# Patient Record
Sex: Male | Born: 1950 | Race: Black or African American | Hispanic: No | Marital: Married | Smoking: Former smoker
Health system: Southern US, Community
[De-identification: ages and names within clinical notes are randomized; demographics above are authoritative.]

## PROBLEM LIST (undated history)

## (undated) DIAGNOSIS — Z95810 Presence of automatic (implantable) cardiac defibrillator: Secondary | ICD-10-CM

## (undated) DIAGNOSIS — I428 Other cardiomyopathies: Secondary | ICD-10-CM

## (undated) DIAGNOSIS — I513 Intracardiac thrombosis, not elsewhere classified: Secondary | ICD-10-CM

## (undated) DIAGNOSIS — G4730 Sleep apnea, unspecified: Secondary | ICD-10-CM

## (undated) DIAGNOSIS — R0600 Dyspnea, unspecified: Secondary | ICD-10-CM

## (undated) DIAGNOSIS — I509 Heart failure, unspecified: Secondary | ICD-10-CM

## (undated) DIAGNOSIS — F418 Other specified anxiety disorders: Secondary | ICD-10-CM

## (undated) DIAGNOSIS — I499 Cardiac arrhythmia, unspecified: Secondary | ICD-10-CM

## (undated) DIAGNOSIS — I420 Dilated cardiomyopathy: Secondary | ICD-10-CM

## (undated) DIAGNOSIS — I4891 Unspecified atrial fibrillation: Secondary | ICD-10-CM

## (undated) DIAGNOSIS — E785 Hyperlipidemia, unspecified: Secondary | ICD-10-CM

## (undated) HISTORY — PX: ICD IMPLANT: EP1208

---
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
## 2017-03-03 DIAGNOSIS — I5023 Acute on chronic systolic (congestive) heart failure: Secondary | ICD-10-CM

## 2017-03-03 DIAGNOSIS — I428 Other cardiomyopathies: Secondary | ICD-10-CM

## 2017-03-03 HISTORY — DX: Other cardiomyopathies: I42.8

## 2019-04-08 DIAGNOSIS — I4891 Unspecified atrial fibrillation: Secondary | ICD-10-CM | POA: Insufficient documentation

## 2019-04-08 DIAGNOSIS — F418 Other specified anxiety disorders: Secondary | ICD-10-CM | POA: Insufficient documentation

## 2019-04-08 DIAGNOSIS — Z95810 Presence of automatic (implantable) cardiac defibrillator: Secondary | ICD-10-CM

## 2019-04-08 DIAGNOSIS — I509 Heart failure, unspecified: Secondary | ICD-10-CM

## 2019-04-08 DIAGNOSIS — E785 Hyperlipidemia, unspecified: Secondary | ICD-10-CM | POA: Diagnosis present

## 2019-04-08 HISTORY — DX: Presence of automatic (implantable) cardiac defibrillator: Z95.810

## 2019-04-08 HISTORY — DX: Hyperlipidemia, unspecified: E78.5

## 2019-04-08 HISTORY — DX: Unspecified atrial fibrillation: I48.91

## 2019-04-08 HISTORY — DX: Other specified anxiety disorders: F41.8

## 2019-04-08 HISTORY — DX: Heart failure, unspecified: I50.9

## 2019-04-09 DIAGNOSIS — I420 Dilated cardiomyopathy: Secondary | ICD-10-CM | POA: Diagnosis present

## 2019-04-09 HISTORY — DX: Dilated cardiomyopathy: I42.0

## 2019-04-13 DIAGNOSIS — I513 Intracardiac thrombosis, not elsewhere classified: Secondary | ICD-10-CM

## 2019-04-13 HISTORY — DX: Intracardiac thrombosis, not elsewhere classified: I51.3

## 2019-04-20 ENCOUNTER — Other Ambulatory Visit: Payer: Self-pay

## 2019-04-20 ENCOUNTER — Emergency Department (HOSPITAL_COMMUNITY): Payer: No Typology Code available for payment source

## 2019-04-20 ENCOUNTER — Inpatient Hospital Stay (HOSPITAL_COMMUNITY)
Admission: EM | Admit: 2019-04-20 | Discharge: 2019-04-24 | DRG: 287 | Disposition: A | Discharge status: Home / Self Care | Payer: No Typology Code available for payment source | Attending: Cardiovascular Disease | Admitting: Cardiovascular Disease

## 2019-04-20 DIAGNOSIS — J90 Pleural effusion, not elsewhere classified: Secondary | ICD-10-CM | POA: Diagnosis not present

## 2019-04-20 DIAGNOSIS — N183 Chronic kidney disease, stage 3 (moderate): Secondary | ICD-10-CM | POA: Diagnosis present

## 2019-04-20 DIAGNOSIS — I4819 Other persistent atrial fibrillation: Secondary | ICD-10-CM | POA: Diagnosis present

## 2019-04-20 DIAGNOSIS — Z9221 Personal history of antineoplastic chemotherapy: Secondary | ICD-10-CM

## 2019-04-20 DIAGNOSIS — R0602 Shortness of breath: Secondary | ICD-10-CM | POA: Diagnosis not present

## 2019-04-20 DIAGNOSIS — Z8572 Personal history of non-Hodgkin lymphomas: Secondary | ICD-10-CM

## 2019-04-20 DIAGNOSIS — I428 Other cardiomyopathies: Secondary | ICD-10-CM

## 2019-04-20 DIAGNOSIS — Z79899 Other long term (current) drug therapy: Secondary | ICD-10-CM

## 2019-04-20 DIAGNOSIS — I509 Heart failure, unspecified: Secondary | ICD-10-CM

## 2019-04-20 DIAGNOSIS — I420 Dilated cardiomyopathy: Secondary | ICD-10-CM | POA: Diagnosis present

## 2019-04-20 DIAGNOSIS — I5023 Acute on chronic systolic (congestive) heart failure: Secondary | ICD-10-CM | POA: Diagnosis not present

## 2019-04-20 DIAGNOSIS — I4891 Unspecified atrial fibrillation: Secondary | ICD-10-CM | POA: Diagnosis present

## 2019-04-20 DIAGNOSIS — Z1159 Encounter for screening for other viral diseases: Secondary | ICD-10-CM

## 2019-04-20 DIAGNOSIS — Z7901 Long term (current) use of anticoagulants: Secondary | ICD-10-CM

## 2019-04-20 DIAGNOSIS — I513 Intracardiac thrombosis, not elsewhere classified: Secondary | ICD-10-CM | POA: Diagnosis present

## 2019-04-20 DIAGNOSIS — I272 Pulmonary hypertension, unspecified: Secondary | ICD-10-CM | POA: Diagnosis present

## 2019-04-20 DIAGNOSIS — F418 Other specified anxiety disorders: Secondary | ICD-10-CM | POA: Diagnosis present

## 2019-04-20 DIAGNOSIS — Z95810 Presence of automatic (implantable) cardiac defibrillator: Secondary | ICD-10-CM | POA: Diagnosis present

## 2019-04-20 DIAGNOSIS — K219 Gastro-esophageal reflux disease without esophagitis: Secondary | ICD-10-CM | POA: Diagnosis present

## 2019-04-20 DIAGNOSIS — E785 Hyperlipidemia, unspecified: Secondary | ICD-10-CM | POA: Diagnosis present

## 2019-04-20 HISTORY — DX: Dyspnea, unspecified: R06.00

## 2019-04-20 HISTORY — DX: Presence of automatic (implantable) cardiac defibrillator: Z95.810

## 2019-04-20 HISTORY — DX: Other cardiomyopathies: I42.8

## 2019-04-20 HISTORY — DX: Cardiac arrhythmia, unspecified: I49.9

## 2019-04-20 HISTORY — DX: Intracardiac thrombosis, not elsewhere classified: I51.3

## 2019-04-20 LAB — COMPREHENSIVE METABOLIC PANEL
ALT: 67 U/L — ABNORMAL HIGH (ref 0–44)
AST: 30 U/L (ref 15–41)
Albumin: 3.2 g/dL — ABNORMAL LOW (ref 3.5–5.0)
Alkaline Phosphatase: 125 U/L (ref 38–126)
Anion gap: 11 (ref 5–15)
BUN: 20 mg/dL (ref 8–23)
CO2: 25 mmol/L (ref 22–32)
Calcium: 9.1 mg/dL (ref 8.9–10.3)
Chloride: 101 mmol/L (ref 98–111)
Creatinine, Ser: 1.61 mg/dL — ABNORMAL HIGH (ref 0.61–1.24)
GFR calc Af Amer: 50 mL/min — ABNORMAL LOW (ref >60)
GFR calc non Af Amer: 43 mL/min — ABNORMAL LOW (ref >60)
Glucose, Bld: 137 mg/dL — ABNORMAL HIGH (ref 70–99)
Potassium: 4.5 mmol/L (ref 3.5–5.1)
Sodium: 137 mmol/L (ref 135–145)
Total Bilirubin: 1.4 mg/dL — ABNORMAL HIGH (ref 0.3–1.2)
Total Protein: 6.4 g/dL — ABNORMAL LOW (ref 6.5–8.1)

## 2019-04-20 LAB — CBC
HCT: 45.2 % (ref 39.0–52.0)
Hemoglobin: 14.3 g/dL (ref 13.0–17.0)
MCH: 27.9 pg (ref 26.0–34.0)
MCHC: 31.6 g/dL (ref 30.0–36.0)
MCV: 88.3 fL (ref 80.0–100.0)
Platelets: 207 10*3/uL (ref 150–400)
RBC: 5.12 MIL/uL (ref 4.22–5.81)
RDW: 15 % (ref 11.5–15.5)
WBC: 9.7 10*3/uL (ref 4.0–10.5)
nRBC: 0 % (ref 0.0–0.2)

## 2019-04-20 LAB — SARS CORONAVIRUS 2 BY RT PCR (HOSPITAL ORDER, PERFORMED IN ~~LOC~~ HOSPITAL LAB): SARS Coronavirus 2: NEGATIVE

## 2019-04-20 LAB — BRAIN NATRIURETIC PEPTIDE: B Natriuretic Peptide: 2049.6 pg/mL — ABNORMAL HIGH (ref 0.0–100.0)

## 2019-04-20 LAB — TROPONIN I (HIGH SENSITIVITY): Troponin I (High Sensitivity): 10 ng/L (ref <18)

## 2019-04-20 MED ORDER — FUROSEMIDE 10 MG/ML IJ SOLN
40.0000 mg | Freq: Once | INTRAMUSCULAR | Status: AC
  Administered 2019-04-20: 40 mg via INTRAVENOUS
  Filled 2019-04-20: qty 4

## 2019-04-20 MED ORDER — DIGOXIN 125 MCG PO TABS
0.1250 mg | ORAL_TABLET | Freq: Every day | ORAL | Status: DC
  Administered 2019-04-21 – 2019-04-24 (×4): 0.125 mg via ORAL
  Filled 2019-04-20 (×4): qty 1

## 2019-04-20 MED ORDER — FUROSEMIDE 20 MG PO TABS
20.0000 mg | ORAL_TABLET | Freq: Two times a day (BID) | ORAL | Status: DC
  Administered 2019-04-21 (×2): 20 mg via ORAL
  Filled 2019-04-20 (×2): qty 1

## 2019-04-20 MED ORDER — ONDANSETRON HCL 4 MG/2ML IJ SOLN: 4.0000 mg | Freq: Four times a day (QID) | INTRAMUSCULAR | Status: DC | PRN

## 2019-04-20 MED ORDER — ACETAMINOPHEN 325 MG PO TABS: 650.0000 mg | ORAL_TABLET | ORAL | Status: DC | PRN

## 2019-04-20 MED ORDER — APIXABAN 5 MG PO TABS
5.0000 mg | ORAL_TABLET | Freq: Two times a day (BID) | ORAL | Status: DC
  Administered 2019-04-21 (×3): 5 mg via ORAL
  Filled 2019-04-20 (×4): qty 1

## 2019-04-20 MED ORDER — METOPROLOL SUCCINATE ER 50 MG PO TB24
50.0000 mg | ORAL_TABLET | Freq: Two times a day (BID) | ORAL | Status: DC
  Administered 2019-04-21 – 2019-04-23 (×6): 50 mg via ORAL
  Filled 2019-04-20 (×7): qty 1

## 2019-04-20 NOTE — H&P (Signed)
CARDIOLOGY H&P  HPI: Jon Oconnell is a 68 y.o. male w/ history of nonischemic CM, HFrEF (LVEF 25%) s/p ICD, and a recent diagnosis of atrial fibrillation c/b left atrial thrombus who presents with fatigue.   Briefly, the patient has longstanding history of chronic reduced ejection fraction heart failure.  He recently moved to the [place] area from [place] and receives much of his care at the [place].  The patient presents to the ED tonight describing approximately 2 weeks of worsening fatigue and weakness.  Notably, he was admitted to [facility] approximately 2 weeks ago with new onset palpitations, fatigue, and shortness of breath.  He was found to be in new atrial fibrillation at that time with RVR.  His BNP was elevated and his creatinine was mildly elevated at 1.5, however with an unknown baseline.  He underwent TEE with plans for electrical cardioversion, however this was aborted after he was found to have a left atrial thrombus.  He was started on amiodarone, beta-blockade, and digoxin as well as apixaban and discharge.  Since that time, the patient describes symptoms of progressive fatigue, exertional shortness of breath, PND, and orthopnea.  He has had no weight gain or leg swelling.  He notes that many of his medications from his recent hospitalization at [facility] were changed including several medications that were discontinued.  He has his new medications with him for review.  He has been taking all of his medications as prescribed and notes that he has not missed any doses of his apixaban since this was for started.  Per record review in care everywhere, his TEE revealing left atrial thrombus was performed on 7/20 and he was discharged from the hospital on 7/21.  In the [facility] emergency department, the patient was found to have an elevated BNP and some crackles in his lung.  He was given 1 dose of IV Lasix with good urine output.  Review of Systems:     Cardiac Review of Systems: {Y]  = yes [ ]  = no  Chest Pain [    ]  Resting SOB [   ] Exertional SOB  [Y]  Orthopnea [Y]   Pedal Edema [   ]    Palpitations [Y] Syncope  [  ]   Presyncope [   ]  General Review of Systems: [Y] = yes [  ]=no Constitional: recent weight change [  ]; anorexia [  ]; fatigue [Y]; nausea [  ]; night sweats [  ]; fever [  ]; or chills [  ];                                                                     Dental: poor dentition[  ];   Eye : blurred vision [  ]; diplopia [   ]; vision changes [  ];  Amaurosis fugax[  ]; Resp: cough [  ];  wheezing[  ];  hemoptysis[  ]; shortness of breath[Y]; paroxysmal nocturnal dyspnea[Y]; dyspnea on exertion[Y]; or orthopnea[Y];  GI:  gallstones[  ], vomiting[  ];  dysphagia[  ]; melena[  ];  hematochezia [  ]; heartburn[  ];   GU: kidney stones [  ]; hematuria[  ];   dysuria [  ];  nocturia[  ];               Skin: rash [  ], swelling[  ];, hair loss[  ];  peripheral edema[  ];  or itching[  ]; Musculosketetal: myalgias[  ];  joint swelling[  ];  joint erythema[  ];  joint pain[  ];  back pain[  ];  Heme/Lymph: bruising[  ];  bleeding[  ];  anemia[  ];  Neuro: TIA[  ];  headaches[  ];  stroke[  ];  vertigo[  ];  seizures[  ];   paresthesias[  ];  difficulty walking[  ];  Psych:depression[  ]; anxiety[  ];  Endocrine: diabetes[  ];  thyroid dysfunction[  ];  Other:  No past medical history on file.  Prior to Admission medications   Medication Sig Start Date End Date Taking? Authorizing Provider  albuterol (VENTOLIN HFA) 108 (90 Base) MCG/ACT inhaler Inhale 2 puffs into the lungs every 6 (six) hours as needed for wheezing or shortness of breath.   Yes [provider]  amiodarone (PACERONE) 200 MG tablet Take 400 mg by mouth daily.   Yes [provider]  apixaban (ELIQUIS) 5 MG TABS tablet Take 5 mg by mouth 2 (two) times daily.   Yes [provider]  digoxin (LANOXIN) 0.125 MG tablet Take 0.125 mg by mouth daily.   Yes [provider]  furosemide (LASIX) 20 MG tablet Take 20 mg by mouth 2 (two) times daily.   Yes [provider]  metoprolol succinate (TOPROL-XL) 50 MG 24 hr tablet Take 50 mg by mouth 2 (two) times a day. Take with or immediately following a meal.   Yes [provider]  UNKNOWN TO PATIENT Inhale 1 ampule into the lungs See admin instructions. Unknown to patient (neb solution): Inhale the contents of 1 ampule into the lungs every 6 hours as needed for shortness of breath or wheezing   Yes [provider]  aspirin EC 81 MG tablet Take 81 mg by mouth daily.    [provider]  eplerenone (INSPRA) 25 MG tablet Take 12.5 mg by mouth 2 (two) times daily.    [provider]  ivabradine (CORLANOR) 5 MG TABS tablet Take 5 mg by mouth 2 (two) times daily with a meal.    [provider]  losartan (COZAAR) 25 MG tablet Take 12.5 mg by mouth daily.    [provider]  omeprazole (PRILOSEC) 20 MG capsule Take 40 mg by mouth daily.    [provider]  sertraline (ZOLOFT) 100 MG tablet Take 50 mg by mouth daily.    [provider]  simvastatin (ZOCOR) 80 MG tablet Take 40 mg by mouth at bedtime.    [provider]      No Known Allergies  Social History   Socioeconomic History   Marital status: Married    Spouse name: Not on file   Number of children: Not on file   Years of education: Not on file   Highest education level: Not on file  Occupational History   Not on file  Social Needs   Financial resource strain: Not on file   Food insecurity    Worry: Not on file    Inability: Not on file   Transportation needs    Medical: Not on file    Non-medical: Not on file  Tobacco Use   Smoking status: Not on file  Substance and Sexual Activity   Alcohol  use: Not on file   Drug use: Not on file   Sexual activity: Not on file  Lifestyle   Physical activity    Days per week: Not on file    Minutes  per session: Not on file   Stress: Not on file  Relationships   Social connections    Talks on phone: Not on file    Gets together: Not on file    Attends religious service: Not on file    Active member of club or organization: Not on file    Attends meetings of clubs or organizations: Not on file    Relationship status: Not on file   Intimate partner violence    Fear of current or ex partner: Not on file    Emotionally abused: Not on file    Physically abused: Not on file    Forced sexual activity: Not on file  Other Topics Concern   Not on file  Social History Narrative   Not on file    No family history on file.  PHYSICAL EXAM: Vitals:   04/20/19 2130 04/20/19 2145  BP: 112/88 107/85  Pulse: 81 74  Resp: 16 20  Temp:    SpO2: 98% 96%   General:  Well appearing. No respiratory difficulty HEENT: normal Neck: supple. JVP normal at around 4 cm H2O. Carotids 2+ bilat; no bruits. No lymphadenopathy or thryomegaly appreciated. Cor: PMI nondisplaced.  Irregularly irregular rhythm.  Normal rate.  No appreciable rubs, gallops or murmurs. Lungs: clear to auscultation bilaterally, normal work of breathing, good air movement throughout Abdomen: soft, nontender, nondistended. No hepatosplenomegaly. No bruits or masses. Good bowel sounds. Extremities: no cyanosis, clubbing, rash, edema; warm bilaterally Neuro: alert & oriented x 3, cranial nerves grossly intact. moves all 4 extremities w/o difficulty. Affect pleasant.  ECG: Rate controlled atrial fibrillation with heart rate 84 bpm, left axis deviation, borderline prolonged QT interval, anteroseptal Q waves, nonspecific ST and T wave changes throughout, no prior ECG for comparison  Results for orders placed or performed during the hospital encounter of 04/20/19 (from the past 24 hour(s))  CBC     Status: None   Collection Time: 04/20/19  4:59 PM  Result Value Ref Range   WBC 9.7 4.0 - 10.5 K/uL   RBC 5.12 4.22 - 5.81 MIL/uL    Hemoglobin 14.3 13.0 - 17.0 g/dL   HCT 45.2 39.0 - 52.0 %   MCV 88.3 80.0 - 100.0 fL   MCH 27.9 26.0 - 34.0 pg   MCHC 31.6 30.0 - 36.0 g/dL   RDW 15.0 11.5 - 15.5 %   Platelets 207 150 - 400 K/uL   nRBC 0.0 0.0 - 0.2 %  Comprehensive metabolic panel     Status: Abnormal   Collection Time: 04/20/19  4:59 PM  Result Value Ref Range   Sodium 137 135 - 145 mmol/L   Potassium 4.5 3.5 - 5.1 mmol/L   Chloride 101 98 - 111 mmol/L   CO2 25 22 - 32 mmol/L   Glucose, Bld 137 (H) 70 - 99 mg/dL   BUN 20 8 - 23 mg/dL   Creatinine, Ser 1.61 (H) 0.61 - 1.24 mg/dL   Calcium 9.1 8.9 - 10.3 mg/dL   Total Protein 6.4 (L) 6.5 - 8.1 g/dL   Albumin 3.2 (L) 3.5 - 5.0 g/dL   AST 30 15 - 41 U/L   ALT 67 (H) 0 - 44 U/L   Alkaline Phosphatase 125 38 - 126 U/L  Total Bilirubin 1.4 (H) 0.3 - 1.2 mg/dL   GFR calc non Af Amer 43 (L) >60 mL/min   GFR calc Af Amer 50 (L) >60 mL/min   Anion gap 11 5 - 15  Troponin I (High Sensitivity)     Status: None   Collection Time: 04/20/19  4:59 PM  Result Value Ref Range   Troponin I (High Sensitivity) 10 <18 ng/L  Brain natriuretic peptide     Status: Abnormal   Collection Time: 04/20/19  4:59 PM  Result Value Ref Range   B Natriuretic Peptide 2,049.6 (H) 0.0 - 100.0 pg/mL  SARS Coronavirus 2 (CEPHEID - Performed in [place] hospital lab), Hosp Order     Status: None   Collection Time: 04/20/19  6:55 PM   Specimen: Nasopharyngeal Swab  Result Value Ref Range   SARS Coronavirus 2 NEGATIVE NEGATIVE   Dg Chest Port 1 View  Result Date: 04/20/2019 CLINICAL DATA:  Shortness of breath, generalized weakness for 2 weeks EXAM: PORTABLE CHEST 1 VIEW COMPARISON:  None. FINDINGS: There is a small left pleural effusion. There is left basilar airspace disease. There is right perihilar airspace disease. At there is no pneumothorax. Hit the heart mediastinum are stable. There is a cardiac pacemaker present. There is no acute osseous abnormality. IMPRESSION: Small left  pleural effusion with left basilar airspace disease which may reflect atelectasis versus pneumonia. Right perihilar airspace disease concerning for pneumonia. Electronically Signed   By: Kathreen Devoid   On: 04/20/2019 17:41   ASSESSMENT: Jon Oconnell is a 67 y.o. male w/ history of nonischemic CM, HFrEF (LVEF 25%) s/p ICD, and a recent diagnosis of atrial fibrillation c/b left atrial thrombus who presents with fatigue and exertional shortness of breath.  Differential diagnosis for the patient's symptoms includes acute decompensated heart failure, pulmonary embolism, acute coronary syndrome, worsening functional status from heart failure and cardiac arrhythmia.  I examined the patient after he was given IV Lasix and on my exam he was found to be euvolemic.  His symptoms do not seem consistent with pulmonary embolism or acute coronary syndrome and he reassuringly has a normal initial troponin and an ECG without acute ischemia.  His symptoms are most likely consistent with worsening cardiac output in the setting of chronic reduced ejection fraction heart failure and new cardiac arrhythmia (atrial fibrillation).   I had a very long discussion with the patient as well as his girlfriend by telephone describing to him the nature of his condition and the difficult position he is in given his recent history of left atrial thrombus.  He is well rate controlled from an atrial fibrillation standpoint at this time and would likely benefit from restoration of normal sinus rhythm.  I explained to him however that this is not possible at this time with his left atrial thrombus in situ.  I explained to him that it is very important for him to finish at least 3 weeks of oral anticoagulation and he may need a repeat TEE to evaluate for resolution of the thrombus before cardioversion could be pursued.  I gave him the option of discharge tonight given that he is euvolemic and well rate controlled.  The patient however  expressed a very strong preference to stay in the hospital for a period of observation as he is very worried that "something bad is going to happen to him."  Notably, the patient's wife is a patient of Dr. Stanford Breed, whom she has contacted and agrees to be the patient's  cardiologist per her report.  PLAN/DISCUSSION: #) Fatigue, SOB: Likely due to cardiac arrhythmia (atrial fibrillation) in the setting of chronic reduced ejection fraction heart failure as noted above.  Patient currently warm and euvolemic. - admit for observation - ambulate to evaluate rate and oxygenation control with exertion - repeat troponin x1 - stop amiodarone given risk for chemical cardioversion (this was started at [facility]) - continue digoxin 0.125mg  daily - continue apixaban 5mg  BID - continue metoprolol succinate 50mg  BID - cont home lasix 20mg  BID - may be able to reintroduce some of his heart failure medications pending his renal function; note he was previously on eplerenone, ivabridine, and losartan - Dr Stanford Breed to see (inpatient vs outpatient)  #) Elevated ALT: ALT found to be slightly elevated to 67 on admission; note that this was normal 2 weeks ago at [facility]. Unclear if he has had elevated LFTs prior to this.  - discontinue amiodarone as per above - monitor liver enzymes  #) Renal function: Cr similar to several weeks ago when at [place] - monitor  Marcie Mowers, MD Cardiology Fellow, PGY-7

## 2019-04-20 NOTE — ED Notes (Signed)
Pt ambulated with no assistance; gait steady. Spo2 remained 95-100% on RA throughout.

## 2019-04-20 NOTE — ED Notes (Signed)
ED TO INPATIENT HANDOFF REPORT  ED Nurse Name and Phone #: Vikki Ports, [facility]  S Name/Age/Gender Jon Oconnell 68 y.o. male Room/Bed: 032C/032C  Code Status   Code Status: Full Code  Home/SNF/Other Home Patient oriented to: situation Is this baseline? No   Triage Complete: Triage complete  Chief Complaint CP, Gen weakness  Triage Note Pt BIB [facility] from his own home. Per EMS patient has had chest pain with shortness of breath, and generalized weakness for about two weeks. Pt denying any pain at present time. Reports that he has shortness of breath with exertion and some chest pressure off and on. A blood clot was found in his heart last week and he was recently started on Eliquis last week. Pt also reports some dizziness when bending over.    Allergies No Known Allergies  Level of Care/Admitting Diagnosis ED Disposition    ED Disposition Condition [facility] Area: [place] [[id number]]  Level of Care: Telemetry Cardiac [103]  Covid Evaluation: Asymptomatic Screening Protocol (No Symptoms)  Diagnosis: Atrial fibrillation ([place]) [427.31.ICD-9-CM]  Admitting Physician: Marcie Mowers [[id number]]  Attending Physician: Shelva Majestic A [[id number]]  PT Class (Do Not Modify): Observation [104]  PT Acc Code (Do Not Modify): Observation [10022]       B Medical/Surgery History No past medical history on file.    A IV Location/Drains/Wounds Patient Lines/Drains/Airways Status   Active Line/Drains/Airways    None          Intake/Output Last 24 hours  Intake/Output Summary (Last 24 hours) at 04/20/2019 2319 Last data filed at 04/20/2019 2119 Gross per 24 hour  Intake -  Output 675 ml  Net -675 ml    Labs/Imaging Results for orders placed or performed during the hospital encounter of 04/20/19 (from the past 48 hour(s))  CBC     Status: None   Collection Time: 04/20/19  4:59 PM  Result Value Ref Range   WBC 9.7 4.0 - 10.5  K/uL   RBC 5.12 4.22 - 5.81 MIL/uL   Hemoglobin 14.3 13.0 - 17.0 g/dL   HCT 45.2 39.0 - 52.0 %   MCV 88.3 80.0 - 100.0 fL   MCH 27.9 26.0 - 34.0 pg   MCHC 31.6 30.0 - 36.0 g/dL   RDW 15.0 11.5 - 15.5 %   Platelets 207 150 - 400 K/uL   nRBC 0.0 0.0 - 0.2 %    Comment: Performed at [facility], [place] [address] [place], [place] [address]  Comprehensive metabolic panel     Status: Abnormal   Collection Time: 04/20/19  4:59 PM  Result Value Ref Range   Sodium 137 135 - 145 mmol/L   Potassium 4.5 3.5 - 5.1 mmol/L   Chloride 101 98 - 111 mmol/L   CO2 25 22 - 32 mmol/L   Glucose, Bld 137 (H) 70 - 99 mg/dL   BUN 20 8 - 23 mg/dL   Creatinine, Ser 1.61 (H) 0.61 - 1.24 mg/dL   Calcium 9.1 8.9 - 10.3 mg/dL   Total Protein 6.4 (L) 6.5 - 8.1 g/dL   Albumin 3.2 (L) 3.5 - 5.0 g/dL   AST 30 15 - 41 U/L   ALT 67 (H) 0 - 44 U/L   Alkaline Phosphatase 125 38 - 126 U/L   Total Bilirubin 1.4 (H) 0.3 - 1.2 mg/dL   GFR calc non Af Amer 43 (L) >60 mL/min   GFR calc Af Amer 50 (L) >  60 mL/min   Anion gap 11 5 - 15    Comment: Performed at [place] [address] [place], [place] [address]  Troponin I (High Sensitivity)     Status: None   Collection Time: 04/20/19  4:59 PM  Result Value Ref Range   Troponin I (High Sensitivity) 10 <18 ng/L    Comment: (NOTE) Elevated high sensitivity troponin I (hsTnI) values and significant  changes across serial measurements may suggest ACS but many other  chronic and acute conditions are known to elevate hsTnI results.  Refer to the "Links" section for chest pain algorithms and additional  guidance. Performed at [facility], [place] [address], [place], [place] [address]   Brain natriuretic peptide     Status: Abnormal   Collection Time: 04/20/19  4:59 PM  Result Value Ref Range   B Natriuretic Peptide 2,049.6 (H) 0.0 - 100.0 pg/mL    Comment: Performed at [place] [address], [place], [place] [address]  SARS Coronavirus 2  (CEPHEID - Performed in [place] hospital lab), Hosp Order     Status: None   Collection Time: 04/20/19  6:55 PM   Specimen: Nasopharyngeal Swab  Result Value Ref Range   SARS Coronavirus 2 NEGATIVE NEGATIVE    Comment: (NOTE) If result is NEGATIVE SARS-CoV-2 target nucleic acids are NOT DETECTED. The SARS-CoV-2 RNA is generally detectable in upper and lower  respiratory specimens during the acute phase of infection. The lowest  concentration of SARS-CoV-2 viral copies this assay can detect is 250  copies / mL. A negative result does not preclude SARS-CoV-2 infection  and should not be used as the sole basis for treatment or other  patient management decisions.  A negative result may occur with  improper specimen collection / handling, submission of specimen other  than nasopharyngeal swab, presence of viral mutation(s) within the  areas targeted by this assay, and inadequate number of viral copies  (<250 copies / mL). A negative result must be combined with clinical  observations, patient history, and epidemiological information. If result is POSITIVE SARS-CoV-2 target nucleic acids are DETECTED. The SARS-CoV-2 RNA is generally detectable in upper and lower  respiratory specimens dur ing the acute phase of infection.  Positive  results are indicative of active infection with SARS-CoV-2.  Clinical  correlation with patient history and other diagnostic information is  necessary to determine patient infection status.  Positive results do  not rule out bacterial infection or co-infection with other viruses. If result is PRESUMPTIVE POSTIVE SARS-CoV-2 nucleic acids MAY BE PRESENT.   A presumptive positive result was obtained on the submitted specimen  and confirmed on repeat testing.  While 2019 novel coronavirus  (SARS-CoV-2) nucleic acids may be present in the submitted sample  additional confirmatory testing may be necessary for epidemiological  and / or clinical management  purposes  to differentiate between  SARS-CoV-2 and other Sarbecovirus currently known to infect humans.  If clinically indicated additional testing with an alternate test  methodology [phone]) is advised. The SARS-CoV-2 RNA is generally  detectable in upper and lower respiratory sp ecimens during the acute  phase of infection. The expected result is Negative. Fact Sheet for Patients:  StrictlyIdeas.no Fact Sheet for Healthcare Providers: BankingDealers.co.za This test is not yet approved or cleared by the [place] FDA and has been authorized for detection and/or diagnosis of SARS-CoV-2 by FDA under an Emergency Use Authorization (EUA).  This EUA will remain in  effect (meaning this test can be used) for the duration of the COVID-19 declaration under Section 564(b)(1) of the Act, 21 U.S.C. section 360bbb-3(b)(1), unless the authorization is terminated or revoked sooner. Performed at [facility], [place] [address] [place], [place] [address]    Dg Chest Port 1 View  Result Date: 04/20/2019 CLINICAL DATA:  Shortness of breath, generalized weakness for 2 weeks EXAM: PORTABLE CHEST 1 VIEW COMPARISON:  None. FINDINGS: There is a small left pleural effusion. There is left basilar airspace disease. There is right perihilar airspace disease. At there is no pneumothorax. Hit the heart mediastinum are stable. There is a cardiac pacemaker present. There is no acute osseous abnormality. IMPRESSION: Small left pleural effusion with left basilar airspace disease which may reflect atelectasis versus pneumonia. Right perihilar airspace disease concerning for pneumonia. Electronically Signed   By: Kathreen Devoid   On: 04/20/2019 17:41    Pending Labs Unresulted Labs (From admission, onward)    Start     Ordered   04/21/19 0500  HIV antibody (Routine Testing)  Tomorrow morning,   R     04/20/19 2303   04/21/19 5638  Basic metabolic panel  Tomorrow  morning,   R     04/20/19 2303   04/21/19 0500  CBC  Tomorrow morning,   R     04/20/19 2303   04/21/19 0500  Hepatic function panel  Tomorrow morning,   R     04/20/19 2303   04/20/19 2303  TSH  Add-on,   AD     04/20/19 2303          Vitals/Pain Today's Vitals   04/20/19 2145 04/20/19 2215 04/20/19 2245 04/20/19 2315  BP: 107/85 (!) 125/93    Pulse: 74 (!) 108 70 79  Resp: 20  15 12   Temp:      TempSrc:      SpO2: 96% 97% 98% 96%  Weight:      Height:        Isolation Precautions No active isolations  Medications Medications  digoxin (LANOXIN) tablet 0.125 mg (has no administration in time range)  furosemide (LASIX) tablet 20 mg (has no administration in time range)  metoprolol succinate (TOPROL-XL) 24 hr tablet 50 mg (has no administration in time range)  apixaban (ELIQUIS) tablet 5 mg (has no administration in time range)  acetaminophen (TYLENOL) tablet 650 mg (has no administration in time range)  ondansetron (ZOFRAN) injection 4 mg (has no administration in time range)  furosemide (LASIX) injection 40 mg (40 mg Intravenous Given 04/20/19 1911)    Mobility walks Low fall risk   Focused Assessments Cardiac Assessment Handoff:  Cardiac Rhythm: Atrial fibrillation No results found for: CKTOTAL, CKMB, CKMBINDEX, TROPONINI No results found for: DDIMER Does the Patient currently have chest pain? No   , Pulmonary Assessment Handoff:  Lung sounds:   O2 Device: Room Air        R Recommendations: See Admitting Provider Note  Report given to:   Additional Notes:

## 2019-04-20 NOTE — ED Provider Notes (Signed)
[place] EMERGENCY DEPARTMENT Provider Note   CSN: [id number] Arrival date & time: 04/20/19  1654    History   Chief Complaint Chief Complaint  Patient presents with  . Chest Pain    HPI Jon Oconnell is a 68 y.o. male.     HPI  68 yo male complaining of dyspnea recently diagnosed with a fib and left atrial clot started on eliquis, dilated cardiomyopathy, chronic CHF, cardiac defibrillator in place, hyperlipidemia discharge from [facility] 7/21 Patient reports taking medicine as prescribed but having increased dyspnea.  Dyspnea worsens with  Patient having worsening doe.  No peripheral edema or extremity pain.   No past medical history on file.  Patient Active Problem List   Diagnosis Date Noted  . 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 Dilated cardiomyopathy ([place]) 04/09/2019  . Atrial fibrillation with RVR ([place]) 04/08/2019  . Cardiac defibrillator in place 04/08/2019  . Chronic congestive heart failure ([place]) 04/08/2019  . Depression with anxiety 04/08/2019  . Hyperlipidemia 04/08/2019  . Chronic systolic CHF (congestive heart failure) ([place]) 03/03/2017  . Non-ischemic cardiomyopathy ([place]) 03/03/2017       Home Medications    Prior to Admission medications   Medication Sig Start Date End Date Taking? Authorizing Provider  albuterol (VENTOLIN HFA) 108 (90 Base) MCG/ACT inhaler Inhale 2 puffs into the lungs every 6 (six) hours as needed for wheezing or shortness of breath.   Yes [provider]  amiodarone (PACERONE) 200 MG tablet Take 400 mg by mouth daily.   Yes [provider]  apixaban (ELIQUIS) 5 MG TABS tablet Take 5 mg by mouth 2 (two) times daily.   Yes [provider]  digoxin (LANOXIN) 0.125 MG tablet Take 0.125 mg by mouth daily.   Yes [provider]  furosemide (LASIX) 20 MG tablet Take 20 mg by mouth 2 (two) times daily.   Yes [provider]  metoprolol succinate (TOPROL-XL) 50 MG 24 hr tablet Take 50 mg  by mouth 2 (two) times a day. Take with or immediately following a meal.   Yes [provider]  UNKNOWN TO PATIENT Inhale 1 ampule into the lungs See admin instructions. Unknown to patient (neb solution): Inhale the contents of 1 ampule into the lungs every 6 hours as needed for shortness of breath or wheezing   Yes [provider]  aspirin EC 81 MG tablet Take 81 mg by mouth daily.    [provider]  eplerenone (INSPRA) 25 MG tablet Take 12.5 mg by mouth 2 (two) times daily.    [provider]  ivabradine (CORLANOR) 5 MG TABS tablet Take 5 mg by mouth 2 (two) times daily with a meal.    [provider]  losartan (COZAAR) 25 MG tablet Take 12.5 mg by mouth daily.    [provider]  omeprazole (PRILOSEC) 20 MG capsule Take 40 mg by mouth daily.    [provider]  sertraline (ZOLOFT) 100 MG tablet Take 50 mg by mouth daily.    [provider]  simvastatin (ZOCOR) 80 MG tablet Take 40 mg by mouth at bedtime.    [provider]    Family History No family history on file.  Social History Social History   Tobacco Use  . Smoking status: Not on file  Substance Use Topics  . Alcohol use: Not on file  . Drug use: Not on file     Allergies   Patient has no known allergies.   Review  of Systems Review of Systems   Physical Exam Updated Vital Signs BP 116/88   Pulse 80   Temp 98.7 F (37.1 C) (Oral)   Resp (!) 21   Ht 1.753 m (5\' 9" )   Wt 84.8 kg   SpO2 97%   BMI 27.62 kg/m   Physical Exam Vitals signs and nursing note reviewed.  Constitutional:      General: He is not in acute distress.    Appearance: He is well-developed and normal weight. He is not ill-appearing.  HENT:     Head: Normocephalic.  Eyes:     Pupils: Pupils are equal, round, and reactive to light.  Neck:     Musculoskeletal: Normal range of motion.  Cardiovascular:     Rate and Rhythm: Rhythm irregular.     Heart sounds:  Normal heart sounds.  Pulmonary:     Effort: Tachypnea present.     Comments: Crackles at base Abdominal:     General: Bowel sounds are normal.     Palpations: Abdomen is soft.  Musculoskeletal: Normal range of motion.     Right lower leg: No edema.     Left lower leg: No edema.  Skin:    General: Skin is warm and dry.     Capillary Refill: Capillary refill takes less than 2 seconds.  Neurological:     General: No focal deficit present.     Mental Status: He is alert.      ED Treatments / Results  Labs (all labs ordered are listed, but only abnormal results are displayed) Labs Reviewed  COMPREHENSIVE METABOLIC PANEL - Abnormal; Notable for the following components:      Result Value   Glucose, Bld 137 (*)    Creatinine, Ser 1.61 (*)    Total Protein 6.4 (*)    Albumin 3.2 (*)    ALT 67 (*)    Total Bilirubin 1.4 (*)    GFR calc non Af Amer 43 (*)    GFR calc Af Amer 50 (*)    All other components within normal limits  BRAIN NATRIURETIC PEPTIDE - Abnormal; Notable for the following components:   B Natriuretic Peptide 2,049.6 (*)    All other components within normal limits  SARS CORONAVIRUS 2 (HOSPITAL ORDER, [place] [facility])  CBC  TROPONIN I (HIGH SENSITIVITY)  TROPONIN I (HIGH SENSITIVITY)    EKG EKG Interpretation  Date/Time:  Tuesday April 20 2019 16:57:01 EDT Ventricular Rate:  84 PR Interval:    QRS Duration: 100 QT Interval:  418 QTC Calculation: 495 R Axis:   -78 Text Interpretation:  Atrial fibrillation t wave inversion v5 and v6 No old tracing to compare Confirmed by Pattricia Boss [phone]) on 04/20/2019 5:53:03 PM   Radiology Dg Chest Port 1 View  Result Date: 04/20/2019 CLINICAL DATA:  Shortness of breath, generalized weakness for 2 weeks EXAM: PORTABLE CHEST 1 VIEW COMPARISON:  None. FINDINGS: There is a small left pleural effusion. There is left basilar airspace disease. There is right perihilar airspace disease. At there is  no pneumothorax. Hit the heart mediastinum are stable. There is a cardiac pacemaker present. There is no acute osseous abnormality. IMPRESSION: Small left pleural effusion with left basilar airspace disease which may reflect atelectasis versus pneumonia. Right perihilar airspace disease concerning for pneumonia. Electronically Signed   By: Kathreen Devoid   On: 04/20/2019 17:41    Procedures Procedures (including critical care time)  Medications Ordered in ED Medications  furosemide (  LASIX) injection 40 mg (40 mg Intravenous Given 04/20/19 1911)     Initial Impression / Assessment and Plan / ED Course  I have reviewed the triage vital signs and the nursing notes.  Pertinent labs & imaging results that were available during my care of the patient were reviewed by me and considered in my medical decision making (see chart for details).        68 year old male history of recent onset A. fib, on Eliquis, with ongoing dyspnea and chest pain.  Here he has some effusion on his chest x-Valentin Benney as well as elevated BNP.  He is received Lasix 40 mg IV here with urine output of He continues to feel dyspneic.  I discussed with patient and his girlfriend.  His girlfriend states that she spoke with Dr. Stanford Breed today who instructed them to come the emergency department and be evaluated by cardiology team.  Discussed with Dr. Emilio Aspen on for cardiology who will see and evaluate  Final Clinical Impressions(s) / ED Diagnoses   Final diagnoses:  Congestive heart failure, unspecified HF chronicity, unspecified heart failure type [facility])  Pleural effusion    ED Discharge Orders    None       Pattricia Boss, MD 04/22/19 1353

## 2019-04-20 NOTE — ED Triage Notes (Signed)
Pt BIB [facility] from his own home. Per EMS patient has had chest pain with shortness of breath, and generalized weakness for about two weeks. Pt denying any pain at present time. Reports that he has shortness of breath with exertion and some chest pressure off and on. A blood clot was found in his heart last week and he was recently started on Eliquis last week. Pt also reports some dizziness when bending over.

## 2019-04-21 ENCOUNTER — Encounter (HOSPITAL_COMMUNITY): Payer: Self-pay | Admitting: *Deleted

## 2019-04-21 DIAGNOSIS — N183 Chronic kidney disease, stage 3 (moderate): Secondary | ICD-10-CM

## 2019-04-21 DIAGNOSIS — I4819 Other persistent atrial fibrillation: Secondary | ICD-10-CM | POA: Diagnosis not present

## 2019-04-21 DIAGNOSIS — K219 Gastro-esophageal reflux disease without esophagitis: Secondary | ICD-10-CM | POA: Diagnosis present

## 2019-04-21 DIAGNOSIS — I420 Dilated cardiomyopathy: Secondary | ICD-10-CM | POA: Diagnosis present

## 2019-04-21 DIAGNOSIS — I5022 Chronic systolic (congestive) heart failure: Secondary | ICD-10-CM

## 2019-04-21 DIAGNOSIS — I5043 Acute on chronic combined systolic (congestive) and diastolic (congestive) heart failure: Secondary | ICD-10-CM

## 2019-04-21 DIAGNOSIS — I513 Intracardiac thrombosis, not elsewhere classified: Secondary | ICD-10-CM | POA: Diagnosis present

## 2019-04-21 DIAGNOSIS — Z79899 Other long term (current) drug therapy: Secondary | ICD-10-CM | POA: Diagnosis not present

## 2019-04-21 DIAGNOSIS — E785 Hyperlipidemia, unspecified: Secondary | ICD-10-CM | POA: Diagnosis present

## 2019-04-21 DIAGNOSIS — Z9221 Personal history of antineoplastic chemotherapy: Secondary | ICD-10-CM | POA: Diagnosis not present

## 2019-04-21 DIAGNOSIS — J90 Pleural effusion, not elsewhere classified: Secondary | ICD-10-CM | POA: Diagnosis present

## 2019-04-21 DIAGNOSIS — Z7901 Long term (current) use of anticoagulants: Secondary | ICD-10-CM | POA: Diagnosis not present

## 2019-04-21 DIAGNOSIS — I272 Pulmonary hypertension, unspecified: Secondary | ICD-10-CM | POA: Diagnosis present

## 2019-04-21 DIAGNOSIS — Z95810 Presence of automatic (implantable) cardiac defibrillator: Secondary | ICD-10-CM | POA: Diagnosis not present

## 2019-04-21 DIAGNOSIS — I4891 Unspecified atrial fibrillation: Secondary | ICD-10-CM | POA: Diagnosis present

## 2019-04-21 DIAGNOSIS — F418 Other specified anxiety disorders: Secondary | ICD-10-CM | POA: Diagnosis present

## 2019-04-21 DIAGNOSIS — I509 Heart failure, unspecified: Secondary | ICD-10-CM | POA: Diagnosis not present

## 2019-04-21 DIAGNOSIS — I5023 Acute on chronic systolic (congestive) heart failure: Secondary | ICD-10-CM | POA: Diagnosis present

## 2019-04-21 DIAGNOSIS — Z1159 Encounter for screening for other viral diseases: Secondary | ICD-10-CM | POA: Diagnosis not present

## 2019-04-21 DIAGNOSIS — Z8572 Personal history of non-Hodgkin lymphomas: Secondary | ICD-10-CM | POA: Diagnosis not present

## 2019-04-21 LAB — HEPATIC FUNCTION PANEL
ALT: 55 U/L — ABNORMAL HIGH (ref 0–44)
AST: 25 U/L (ref 15–41)
Albumin: 3 g/dL — ABNORMAL LOW (ref 3.5–5.0)
Alkaline Phosphatase: 116 U/L (ref 38–126)
Bilirubin, Direct: 0.3 mg/dL — ABNORMAL HIGH (ref 0.0–0.2)
Indirect Bilirubin: 1.2 mg/dL — ABNORMAL HIGH (ref 0.3–0.9)
Total Bilirubin: 1.5 mg/dL — ABNORMAL HIGH (ref 0.3–1.2)
Total Protein: 5.6 g/dL — ABNORMAL LOW (ref 6.5–8.1)

## 2019-04-21 LAB — CBC
HCT: 41.9 % (ref 39.0–52.0)
Hemoglobin: 13.5 g/dL (ref 13.0–17.0)
MCH: 28.1 pg (ref 26.0–34.0)
MCHC: 32.2 g/dL (ref 30.0–36.0)
MCV: 87.1 fL (ref 80.0–100.0)
Platelets: 196 10*3/uL (ref 150–400)
RBC: 4.81 MIL/uL (ref 4.22–5.81)
RDW: 14.9 % (ref 11.5–15.5)
WBC: 8.9 10*3/uL (ref 4.0–10.5)
nRBC: 0 % (ref 0.0–0.2)

## 2019-04-21 LAB — HIV ANTIBODY (ROUTINE TESTING W REFLEX): HIV Screen 4th Generation wRfx: NONREACTIVE

## 2019-04-21 LAB — BASIC METABOLIC PANEL
Anion gap: 8 (ref 5–15)
BUN: 19 mg/dL (ref 8–23)
CO2: 29 mmol/L (ref 22–32)
Calcium: 8.9 mg/dL (ref 8.9–10.3)
Chloride: 102 mmol/L (ref 98–111)
Creatinine, Ser: 1.69 mg/dL — ABNORMAL HIGH (ref 0.61–1.24)
GFR calc Af Amer: 47 mL/min — ABNORMAL LOW (ref >60)
GFR calc non Af Amer: 41 mL/min — ABNORMAL LOW (ref >60)
Glucose, Bld: 101 mg/dL — ABNORMAL HIGH (ref 70–99)
Potassium: 4.2 mmol/L (ref 3.5–5.1)
Sodium: 139 mmol/L (ref 135–145)

## 2019-04-21 LAB — TROPONIN I (HIGH SENSITIVITY): Troponin I (High Sensitivity): 12 ng/L (ref <18)

## 2019-04-21 LAB — TSH: TSH: 4.549 u[IU]/mL — ABNORMAL HIGH (ref 0.350–4.500)

## 2019-04-21 MED ORDER — NON FORMULARY: 25.0000 mg | Freq: Every day | Status: DC

## 2019-04-21 MED ORDER — EPLERENONE 25 MG PO TABS
25.0000 mg | ORAL_TABLET | Freq: Every day | ORAL | Status: DC
  Administered 2019-04-21 – 2019-04-24 (×4): 25 mg via ORAL
  Filled 2019-04-21 (×5): qty 1

## 2019-04-21 MED ORDER — FUROSEMIDE 10 MG/ML IJ SOLN
80.0000 mg | Freq: Two times a day (BID) | INTRAMUSCULAR | Status: DC
  Administered 2019-04-21: 80 mg via INTRAVENOUS
  Filled 2019-04-21: qty 8

## 2019-04-21 MED ORDER — AMIODARONE HCL 200 MG PO TABS
400.0000 mg | ORAL_TABLET | Freq: Every day | ORAL | Status: DC
  Administered 2019-04-21 – 2019-04-24 (×4): 400 mg via ORAL
  Filled 2019-04-21 (×4): qty 2

## 2019-04-21 NOTE — Plan of Care (Signed)
  Problem: Education: Goal: Knowledge of disease or condition will improve Outcome: Progressing Note: Patient expresses that he is aware that he needs to "slow down" because of his medical problems.  States that he doesn't think he'll be able to work now because of his SOB and dyspnea with exertion. Goal: Understanding of medication regimen will improve Outcome: Progressing Note: Patient is aware of changes made to his medication regimen.

## 2019-04-21 NOTE — Progress Notes (Addendum)
Progress Note  Patient Name: Jon Oconnell Date of Encounter: 04/21/2019  Primary Cardiologist: No primary care provider on file.   Subjective   Patient admitted overnight for acute CHF. Patient reports breathing has improved some since yesterday and he was able to sleep much better last night. Laying almost completely flat at time of evaluation. He is currently chest pain free but does report some mild left-sided non-radiating chest tightness that persisted into this morning. He states he has had this chest pain for a while and Cardiologist at the [facility] felt like it was most likely reflux so he was started on Omeprazole which helps. No exertional chest pain. He reports some lightheadedness/dizziness with quick position changes but no syncope.  Inpatient Medications    Scheduled Meds: . apixaban  5 mg Oral BID  . digoxin  0.125 mg Oral Daily  . furosemide  20 mg Oral BID  . metoprolol succinate  50 mg Oral BID   Continuous Infusions:  PRN Meds: acetaminophen, ondansetron (ZOFRAN) IV   Vital Signs    Vitals:   04/21/19 0039 04/21/19 0041 04/21/19 0440 04/21/19 0803  BP:  110/87 (!) 86/56 98/78  Pulse:   86 86  Resp:      Temp:   97.9 F (36.6 C)   TempSrc:   Oral   SpO2:   92%   Weight: 82.2 kg  82.2 kg   Height:        Intake/Output Summary (Last 24 hours) at 04/21/2019 1027 Last data filed at 04/21/2019 0100 Gross per 24 hour  Intake 200 ml  Output 1095 ml  Net -895 ml   Last 3 Weights 04/21/2019 04/21/2019 04/20/2019  Weight (lbs) 181 lb 3.5 oz 181 lb 3.2 oz 187 lb  Weight (kg) 82.2 kg 82.192 kg 84.823 kg      Telemetry    Atrial fibrillation with rates well controlled in the 60's to 90's. - Personally Reviewed  ECG    No new ECG tracing today. - Personally Reviewed  Physical Exam   GEN: 68 year old male resting comfortably in no acute distress.   Neck: Supple. JVD appears mildly elevated to mid neck. Cardiac: Irregularly irregular rhythm with regular  rate. No murmurs, gallops, or rubs appreciated. Radial and distal pedal pulses 2+ and equal bilaterally. Respiratory: No increased work of breathing. Lungs clear to auscultation bilaterally. No wheezes, rhonchi, or rales. GI: Abdomen soft, non-distended, and non-tender. Bowel sounds present. MS: No lower extremity edema.  Neuro:  No focal deficits. Psych: Normal affect. Responds appropriately.   Labs    High Sensitivity Troponin:   Recent Labs  Lab 04/20/19 1659 04/20/19 2336  TROPONINIHS 10 12      Cardiac EnzymesNo results for input(s): TROPONINI in the last 168 hours. No results for input(s): TROPIPOC in the last 168 hours.   Chemistry Recent Labs  Lab 04/20/19 1659 04/21/19 0445  NA 137 139  K 4.5 4.2  CL 101 102  CO2 25 29  GLUCOSE 137* 101*  BUN 20 19  CREATININE 1.61* 1.69*  CALCIUM 9.1 8.9  PROT 6.4* 5.6*  ALBUMIN 3.2* 3.0*  AST 30 25  ALT 67* 55*  ALKPHOS 125 116  BILITOT 1.4* 1.5*  GFRNONAA 43* 41*  GFRAA 50* 47*  ANIONGAP 11 8     Hematology Recent Labs  Lab 04/20/19 1659 04/21/19 0445  WBC 9.7 8.9  RBC 5.12 4.81  HGB 14.3 13.5  HCT 45.2 41.9  MCV 88.3 87.1  MCH  27.9 28.1  MCHC 31.6 32.2  RDW 15.0 14.9  PLT 207 196    BNP Recent Labs  Lab 04/20/19 1659  BNP 2,049.6*     DDimer No results for input(s): DDIMER in the last 168 hours.   Radiology    Dg Chest Port 1 View  Result Date: 04/20/2019 CLINICAL DATA:  Shortness of breath, generalized weakness for 2 weeks EXAM: PORTABLE CHEST 1 VIEW COMPARISON:  None. FINDINGS: There is a small left pleural effusion. There is left basilar airspace disease. There is right perihilar airspace disease. At there is no pneumothorax. Hit the heart mediastinum are stable. There is a cardiac pacemaker present. There is no acute osseous abnormality. IMPRESSION: Small left pleural effusion with left basilar airspace disease which may reflect atelectasis versus pneumonia. Right perihilar airspace disease  concerning for pneumonia. Electronically Signed   By: Kathreen Devoid   On: 04/20/2019 17:41    Cardiac Studies   Echocardiogram 04/09/2019: A complete portable two-dimensional transthoracic echocardiogram with color flow Doppler and Spectral Doppler was performed. The study was technically adequate. The left ventricle is moderately dilated. There is normal left ventricular wall thickness. There is severe diffuse hypokinesis of the left ventricle. The left ventricular ejection fraction is markedly reduced (25-30%). Unable to adequately determine diastolic dysfunction. There is a defibrillator lead in the right ventricle. The left atrium is mildly dilated. There is moderate (2+) tricuspid regurgitation. Right ventricular systolic pressure is elevated between 40-55mm Hg, consistent with moderate pulmonary hypertension.  Patient Profile   [name] is a 68 y.o. male with a history of chronic systolic CHF/ non-ischemic cardiomyopathy with EF of 25-30% s/p AICD, recent diagnosis of atrial fibrillation complicated by left atrial thrombus, and hyperlipidemia. He was recently admitted to [facility] from 04/08/2019 to 04/13/2019 for new onset atrial fibrillation with RVR after presenting with fatigue and shortness of breath. Rates were difficult to control due to hypotension. TEE showed left atrial thrombus so patient was unable to be cardioverted. Patient discharged on Toprol-XL 50mg  twice daily, Amiodarone 400mg  daily, Digoxin 0.125mg  daily, and Eliquis 5mg  twice daily. Patient presented to the [facility] ED yesterday for progressive fatigue, exertional shortness of breath, PND, and orthopnea. Patient admitted for acute on chronic systolic CHF.  Assessment & Plan    Acute on Chronic Systolic CHF s/p ICD - Patient presents with progressive fatigue and dyspnea on exertion. Patient reports he was first diagnosed with CHF in 2014 while living in [place] He reports having a cardiac  catheterization at somewhat that he thinks was normal. Has been following with Cardiologist at [facility] but has been trying to get established with [facility] (Dr. Stanford Breed). - BNP elevated at 2,049.6.  - Recent Echo at [facility] showed LVEF of 25-30% with with severe diffuse hypokinesis of the left ventricle, moderate tricuspid regurgitation, and moderate pulmonary hypertension with RVSP of 40-50 mmHg. - Chest x-ray showed small left pleural effusion with left basilar airspace disease which may reflect atelectasis vs pneumonia. Right perihilar airspace also concerning for pneumonia. - Patient was given 1 dose of IV Lasix 40mg  in the ED with good response. Documented urianry output of 1.4 L since yesterday. Weight 181 lbs today. Patient reports dry weight around 185 lbs at home. - JVD looks mildly elevated around mid neck but patient otherwise appears euvolemic. Lungs clear and no lower extremity edema.  - Currently on home PO Lasix 20mg  twice daily. - Previously on Eplerenone, Ivabradine, Spirinololactone, and Losartan but these were recently. discontinued at  discharge from [facility] due to hypotension and AKI. - Will discuss plan with MD. Symptoms consistent with CHF but patient appears euvolemic. Given significantly reduce EF with low BP, would have low threshold for consulting CHF team.  Atrial Fibrillation - Rates currently well controlled in the 60's to 90's on telemetry. - TSH minimally elevated at 4.549.  - Continue Toprol-XL 50mg  twice daily as BP allows. - Continue Digoxin 0.125mg  daily.  - Home Amiodarone was discontinued on admission due to concern for chemical conversion with known left atrial thrombus. - Continue chronic anticoagulation with Eliquis 5mg  twice daily.  Left Atrial Thrombus - TEE on 04/12/2019 showed left atrial thrombus. - Patient on Eliquis 5mg  twice daily. He states he has not missed a dose.  Chest Tightness - Patient reports some left sided chest tightness last night  that persisted into this morning. Currently chest pain free. Patient states she had had similar chest pain for a while. Cardiology at the [facility] felt like symptoms consistent with reflux and patient was started on Omeprazole which has helped. No exertional chest pain.  - High-sensitivity troponin negative x2. - EKG shows rate controlled atrial fibrillation with poor R wave progression and mild T wave inversion in V5-V6. No prior tracings available for comparison. - Patient reportedly had a cardiac catheterization at the [facility] sometimes since 2014 which he thinks was normal. Records unavailable for review at this time. Given reduced EF, patient may benefit from repeat ischemic evaluation. Will discuss with MD.   For questions or updates, please contact [place] Please consult [email or web address] for contact info under        Signed, Darreld Mclean, PA-C  04/21/2019, 10:27 AM    Attending Note:   The patient was seen and examined.  Agree with assessment and plan as noted above.  Changes made to the above note as needed.  Patient seen and independently examined with Cletus Gash, PA .   We discussed all aspects of the encounter. I agree with the assessment and plan as stated above.  1.   Chronic systoilc CHF:   Has had CHF since 2014.   Has been on good medical therapy but his meds have largely been held due to hypotesion.  I will ask the advanced CHF team to see for further recommendations  2.  Atrial fib :   HR is well controlled.  Contin toprol XL,  Is on eliquis   3.   Chest tightness:  Has had a cath in the past.  Reportedly looked ok   I have spent a total of 40 minutes with patient reviewing hospital  notes , telemetry, EKGs, labs and examining patient as well as establishing an assessment and plan that was discussed with the patient. > 50% of time was spent in direct patient care.    Thayer Headings, Brooke Bonito., MD, [facility] 04/21/2019, 2:04 PM [address]   [place] Pager [phone]

## 2019-04-21 NOTE — Consult Note (Signed)
Advanced Heart Failure Team Consult Note   Primary Physician: [facility] PCP-Cardiologist:  No primary care provider on file.  Reason for Consultation: CHF  HPI:    Jon Oconnell is seen today for evaluation of CHF at the request of Dr. Acie Fredrickson.   68 y.o. with history of nonischemic cardiomyopathy, prior non-Hodgkins lymphoma, and atrial fibrillation was admitted with decompensated CHF.    Patient has a long-standing history of CHF.  In 2014, EF was 30-35%.  Cath at that time showed no obstructive CAD.  He has a Medtronic ICD.  He recently moved to [place]. About 2 wks ago, he was seen at the [facility] in [place] due to worsening dyspnea and palpitations.  He was found to be in atrial fibrillation with RVR.  He was started on Eliquis and TEE-guided DCCV was planned, but TEE showed LA thrombus so he did not have the cardioversion.  The TEE showed EF 25-30% with diffuse hypokinesis, normal RV, mild MR, moderate TR.  He was discharged home on Lasix 20 mg daily. Since then, he has continued to be short of breath with exertion.    He came to the ER at [facility] on 7/28 due to shortness of breath and was admitted with decompensated CHF.  He also has been having chest tightness, nonexertional.  He has been in atrial fibrillation but HR has been reasonably well-controlled (he is on amiodarone and Toprol XL).  He is currently on po Lasix.  He is short of breath with exertion still.  Troponin was not elevated and COVID-19 was negative. BNP was 2049.    Review of Systems: All systems reviewed and negative except as per HPI.   Home Medications Prior to Admission medications   Medication Sig Start Date End Date Taking? Authorizing Provider  albuterol (VENTOLIN HFA) 108 (90 Base) MCG/ACT inhaler Inhale 2 puffs into the lungs every 6 (six) hours as needed for wheezing or shortness of breath.   Yes [provider]  amiodarone (PACERONE) 200 MG tablet Take 400 mg by  mouth daily.   Yes [provider]  apixaban (ELIQUIS) 5 MG TABS tablet Take 5 mg by mouth 2 (two) times daily.   Yes [provider]  digoxin (LANOXIN) 0.125 MG tablet Take 0.125 mg by mouth daily.   Yes [provider]  furosemide (LASIX) 20 MG tablet Take 20 mg by mouth 2 (two) times daily.   Yes [provider]  metoprolol succinate (TOPROL-XL) 50 MG 24 hr tablet Take 50 mg by mouth 2 (two) times a day. Take with or immediately following a meal.   Yes [provider]  UNKNOWN TO PATIENT Inhale 1 ampule into the lungs See admin instructions. Unknown to patient (neb solution): Inhale the contents of 1 ampule into the lungs every 6 hours as needed for shortness of breath or wheezing   Yes [provider]  aspirin EC 81 MG tablet Take 81 mg by mouth daily.    [provider]  eplerenone (INSPRA) 25 MG tablet Take 12.5 mg by mouth 2 (two) times daily.    [provider]  ivabradine (CORLANOR) 5 MG TABS tablet Take 5 mg by mouth 2 (two) times daily with a meal.    [provider]  losartan (COZAAR) 25 MG tablet Take 12.5 mg by mouth daily.    [provider]  omeprazole (PRILOSEC) 20 MG capsule Take 40 mg by mouth daily.    [provider]  sertraline (ZOLOFT) 100 MG tablet Take 50 mg by mouth daily.    [provider]  simvastatin (ZOCOR) 80 MG tablet Take 40 mg by mouth at bedtime.    [provider]    Past Medical History: 1. Non-Hodgkins lymphoma: Remote, in remission.  2. Atrial fibrillation: First noted in 7/20.  TEE in 7/20 showed left atrial appendage thrombus.  3. Chronic systolic CHF: Echo in 4315 with EF 30-35%, cath at that time showed no significant CAD.  - He has a Medtronic ICD.  - TEE (7/20): EF 25-30% with diffuse hypokinesis, normal RV, moderate TR, mild MR.  There was a LA appendage thrombus.   Past Surgical History: Past Surgical History:  Procedure  Laterality Date  . ICD IMPLANT Left     Family History: No cardiomyopathy or premature CAD noted.   Social History: Social History   Socioeconomic History  . Marital status: Married    Spouse name: Not on file  . Number of children: Not on file  . Years of education: Not on file  . Highest education level: Not on file  Occupational History  . Not on file  Social Needs  . Financial resource strain: Not on file  . Food insecurity    Worry: Not on file    Inability: Not on file  . Transportation needs    Medical: Not on file    Non-medical: Not on file  Tobacco Use  . Smoking status: Former Research scientist (life sciences)  . Smokeless tobacco: Never Used  Substance and Sexual Activity  . Alcohol use: Not Currently  . Drug use: Never  . Sexual activity: Not Currently  Lifestyle  . Physical activity    Days per week: Not on file    Minutes per session: Not on file  . Stress: Not on file  Relationships  . Social Herbalist on phone: Not on file    Gets together: Not on file    Attends religious service: Not on file    Active member of club or organization: Not on file    Attends meetings of clubs or organizations: Not on file    Relationship status: Not on file  Other Topics Concern  . Not on file  Social History Narrative  . Not on file    Allergies:  No Known Allergies  Objective:    Vital Signs:   Temp:  [97.9 F (36.6 C)-98 F (36.7 C)] 98 F (36.7 C) (07/29 1417) Pulse Rate:  [70-108] 80 (07/29 1417) Resp:  [12-20] 12 (07/29 1417) BP: (86-125)/(56-93) 104/64 (07/29 1721) SpO2:  [92 %-98 %] 94 % (07/29 1417) Weight:  [82.2 kg] 82.2 kg (07/29 0440) Last BM Date: 04/20/19  Weight change: Filed Weights   04/20/19 1658 04/21/19 0039 04/21/19 0440  Weight: 84.8 kg 82.2 kg 82.2 kg    Intake/Output:   Intake/Output Summary (Last 24 hours) at 04/21/2019 2141 Last data filed at 04/21/2019 2046 Gross per 24 hour  Intake 980 ml  Output 1595 ml  Net -615 ml       Physical Exam    General:  Well appearing. No resp difficulty HEENT: normal Neck: supple. JVP 14-16. Carotids 2+ bilat; no bruits. No lymphadenopathy or thyromegaly appreciated. Cor: PMI lateral. Irregular rate & rhythm. No rubs, gallops or murmurs. Lungs: clear Abdomen: soft, nontender, nondistended. No hepatosplenomegaly. No bruits or masses. Good bowel sounds. Extremities: no cyanosis, clubbing, rash, edema Neuro: alert & orientedx3, cranial nerves grossly intact. moves all 4  extremities w/o difficulty. Affect pleasant   Telemetry   Atrial fibrillation rate 60s (personally reviewed)  EKG    Atrial fibrillation, diffuse nonspecific T wave inversions (personally reviewed)  Labs   Basic Metabolic Panel: Recent Labs  Lab 04/20/19 1659 04/21/19 0445  NA 137 139  K 4.5 4.2  CL 101 102  CO2 25 29  GLUCOSE 137* 101*  BUN 20 19  CREATININE 1.61* 1.69*  CALCIUM 9.1 8.9    Liver Function Tests: Recent Labs  Lab 04/20/19 1659 04/21/19 0445  AST 30 25  ALT 67* 55*  ALKPHOS 125 116  BILITOT 1.4* 1.5*  PROT 6.4* 5.6*  ALBUMIN 3.2* 3.0*   No results for input(s): LIPASE, AMYLASE in the last 168 hours. No results for input(s): AMMONIA in the last 168 hours.  CBC: Recent Labs  Lab 04/20/19 1659 04/21/19 0445  WBC 9.7 8.9  HGB 14.3 13.5  HCT 45.2 41.9  MCV 88.3 87.1  PLT 207 196    Cardiac Enzymes: No results for input(s): CKTOTAL, CKMB, CKMBINDEX, TROPONINI in the last 168 hours.  BNP: BNP (last 3 results) Recent Labs    04/20/19 1659  BNP 2,049.6*    ProBNP (last 3 results) No results for input(s): PROBNP in the last 8760 hours.   CBG: No results for input(s): GLUCAP in the last 168 hours.  Coagulation Studies: No results for input(s): LABPROT, INR in the last 72 hours.   Imaging    No results found.   Medications:     Current Medications: . amiodarone  400 mg Oral Daily  . apixaban  5 mg Oral BID  . digoxin  0.125 mg Oral Daily  .  eplerenone  25 mg Oral Daily  . furosemide  80 mg Intravenous BID  . metoprolol succinate  50 mg Oral BID     Infusions:    Assessment/Plan   1. Acute on chronic systolic CHF: Patient has had a cardiomyopathy since at least 2014, at that time echo showed EF 30-35%.  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 Medtronic ICD. 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 HIV negative this admission. BNP was quite high this admission, and on my exam, he is volume overloaded with JVP 14-16 cm.  Still short of breath.  - He needs further diuresis, start Lasix 80 mg IV bid.  - Continue digoxin.  - Continue current Toprol XL.  - Start eplerenone 25 mg daily.  - Creatinine mildly elevated at 1.69, will hold off on ARB/ARNI until I make sure creatinine does not rise with diuresis.  - No indication for CRT.  - RHC/LHC would be reasonable prior to discharge to assess filling pressures and cardiac output after diuresis as well as rule out development of coronary disease given chest pain.  2. Atrial fibrillation: Rate is now controlled on Toprol XL and amiodarone.  He is on Eliquis.  LV thrombus noted on TEE at [facility] earlier this month so no DCCV yet.  - Continue Eliquis for a month, repeat TEE at that time with DCCV if thrombus resolved.  - He likely would be an atrial fibrillation ablation candidate (CASTLE-HF).  3. CKD stage 3: Creatinine around 1.6, follow closely.   Length of Stay: 0  [name], MD  04/21/2019, 9:41 PM  Advanced Heart Failure Team Pager [phone] (M-F; 7a - 4p)  Please contact [facility] for night-coverage after hours (4p -7a ) and weekends on [email or web address]

## 2019-04-21 NOTE — Plan of Care (Signed)
  Problem: Activity: Goal: Ability to tolerate increased activity will improve Outcome: Progressing Note: Ambulates to bathroom without difficulty or SOB.   Problem: Clinical Measurements: Goal: Will remain free from infection Outcome: Progressing Note: No s/s of infection noted.

## 2019-04-21 NOTE — Progress Notes (Signed)
BP this am 98/78 - metoprolol held, will reassess BP later this morning and see if medication can be adminstered

## 2019-04-21 NOTE — Discharge Instructions (Signed)

## 2019-04-21 NOTE — TOC Initial Note (Addendum)
Transition of Care [facility]) - Initial/Assessment Note    Patient Details  Name: Jon Oconnell MRN: [id number] Date of Birth: 27-Jul-1951  Transition of Care [facility]) CM/SW Contact:    Bethena Roys, RN Phone Number: 04/21/2019, 3:26 PM  Clinical Narrative:   CM notified [facility] that the patient had been hospitalized. Pt sees Dr. Ace Gins and CSW is Ralene Bathe @ the [facility]. CM did call several times for assistance regarding patient requests for assistance with housing. Patient is not sure that he can return to work to afford an apartment that he was trying to rent. He was currently working in Pharmacist, hospital. Patient was trying to get a one bedroom apartment. Currently living with girlfriend, however he states this is only temporary. Awaiting call back from the [facility] to see if the CSW can be of assistance with housing. CM will continue to monitor for additional transition of care needs.              Expected Discharge Plan: Home/Self Care Barriers to Discharge: No Barriers Identified   Patient Goals and CMS Choice Patient states their goals for this hospitalization and ongoing recovery are:: "to find some assistance for housing"   Choice offered to / list presented to : NA  Expected Discharge Plan and Services Expected Discharge Plan: Home/Self Care In-house Referral: NA Discharge Planning Services: CM Consult Post Acute Care Choice: NA Living arrangements for the past 2 months: Single Family Home                           HH Arranged: NA          Prior Living Arrangements/Services Living arrangements for the past 2 months: Single Family Home Lives with:: Significant Other Patient language and need for interpreter reviewed:: Yes Do you feel safe going back to the place where you live?: Yes(living with girlfriend- pt thinks he may not be able to return to work)      Need for Family Participation in Patient Care: No (Comment) Care  giver support system in place?: No (comment)   Criminal Activity/Legal Involvement Pertinent to Current Situation/Hospitalization: No - Comment as needed  Activities of Daily Living Home Assistive Devices/Equipment: Electric scooter ADL Screening (condition at time of admission) Patient's cognitive ability adequate to safely complete daily activities?: Yes Is the patient deaf or have difficulty hearing?: No Does the patient have difficulty seeing, even when wearing glasses/contacts?: No Does the patient have difficulty concentrating, remembering, or making decisions?: No Patient able to express need for assistance with ADLs?: Yes Does the patient have difficulty dressing or bathing?: No Independently performs ADLs?: Yes (appropriate for developmental age) Does the patient have difficulty walking or climbing stairs?: Yes(SOB and BLE weakness) Weakness of Legs: Both Weakness of Arms/Hands: None  Permission Sought/Granted Permission sought to share information with : Customer service manager, Family Supports       Permission granted to share info w AGENCY: [facility]        Emotional Assessment Appearance:: Appears stated age Attitude/Demeanor/Rapport: Engaged Affect (typically observed): Accepting Orientation: : Oriented to Situation, Oriented to  Time, Oriented to Place, Oriented to Self Alcohol / Substance Use: Not Applicable Psych Involvement: No (comment)  Admission diagnosis:  Pleural effusion [J90] Congestive heart failure, unspecified HF chronicity, unspecified heart failure type [facility]) [I50.9] Patient Active Problem List   Diagnosis Date Noted  . Atrial fibrillation ([place]) 04/20/2019  . LA  thrombus 04/13/2019  . Dilated cardiomyopathy ([place]) 04/09/2019  . Atrial fibrillation with RVR ([place]) 04/08/2019  . Cardiac defibrillator in place 04/08/2019  . Chronic congestive heart failure ([place]) 04/08/2019  . Depression with anxiety  04/08/2019  . Hyperlipidemia 04/08/2019  . Chronic systolic CHF (congestive heart failure) ([place]) 03/03/2017  . Non-ischemic cardiomyopathy ([place]) 03/03/2017   PCP:  [facility], [place]:   [place], [place], [place] [phone] [place] [address] Phone: [phone] Fax: [phone]     Social Determinants of Health (SDOH) Interventions    Readmission Risk Interventions No flowsheet data found.

## 2019-04-22 LAB — CBC WITH DIFFERENTIAL/PLATELET
Abs Immature Granulocytes: 0.04 10*3/uL (ref 0.00–0.07)
Basophils Absolute: 0.1 10*3/uL (ref 0.0–0.1)
Basophils Relative: 1 %
Eosinophils Absolute: 0.1 10*3/uL (ref 0.0–0.5)
Eosinophils Relative: 1 %
HCT: 46 % (ref 39.0–52.0)
Hemoglobin: 14.8 g/dL (ref 13.0–17.0)
Immature Granulocytes: 0 %
Lymphocytes Relative: 12 %
Lymphs Abs: 1.4 10*3/uL (ref 0.7–4.0)
MCH: 28.1 pg (ref 26.0–34.0)
MCHC: 32.2 g/dL (ref 30.0–36.0)
MCV: 87.5 fL (ref 80.0–100.0)
Monocytes Absolute: 0.9 10*3/uL (ref 0.1–1.0)
Monocytes Relative: 8 %
Neutro Abs: 8.6 10*3/uL — ABNORMAL HIGH (ref 1.7–7.7)
Neutrophils Relative %: 78 %
Platelets: 212 10*3/uL (ref 150–400)
RBC: 5.26 MIL/uL (ref 4.22–5.81)
RDW: 14.7 % (ref 11.5–15.5)
WBC: 11 10*3/uL — ABNORMAL HIGH (ref 4.0–10.5)
nRBC: 0 % (ref 0.0–0.2)

## 2019-04-22 LAB — BASIC METABOLIC PANEL
Anion gap: 10 (ref 5–15)
BUN: 23 mg/dL (ref 8–23)
CO2: 31 mmol/L (ref 22–32)
Calcium: 9.3 mg/dL (ref 8.9–10.3)
Chloride: 99 mmol/L (ref 98–111)
Creatinine, Ser: 1.66 mg/dL — ABNORMAL HIGH (ref 0.61–1.24)
GFR calc Af Amer: 48 mL/min — ABNORMAL LOW (ref >60)
GFR calc non Af Amer: 41 mL/min — ABNORMAL LOW (ref >60)
Glucose, Bld: 103 mg/dL — ABNORMAL HIGH (ref 70–99)
Potassium: 4.2 mmol/L (ref 3.5–5.1)
Sodium: 140 mmol/L (ref 135–145)

## 2019-04-22 LAB — MAGNESIUM: Magnesium: 2.1 mg/dL (ref 1.7–2.4)

## 2019-04-22 MED ORDER — FUROSEMIDE 10 MG/ML IJ SOLN
80.0000 mg | Freq: Two times a day (BID) | INTRAMUSCULAR | Status: AC
  Administered 2019-04-22: 80 mg via INTRAVENOUS
  Filled 2019-04-22: qty 8

## 2019-04-22 MED ORDER — SODIUM CHLORIDE 0.9 % IV SOLN: 250.0000 mL | INTRAVENOUS | Status: DC | PRN

## 2019-04-22 MED ORDER — SODIUM CHLORIDE 0.9% FLUSH
3.0000 mL | Freq: Two times a day (BID) | INTRAVENOUS | Status: DC
  Administered 2019-04-22 – 2019-04-23 (×4): 3 mL via INTRAVENOUS

## 2019-04-22 MED ORDER — SODIUM CHLORIDE 0.9% FLUSH: 3.0000 mL | INTRAVENOUS | Status: DC | PRN

## 2019-04-22 MED ORDER — TORSEMIDE 20 MG PO TABS
40.0000 mg | ORAL_TABLET | Freq: Every day | ORAL | Status: DC
  Administered 2019-04-23: 40 mg via ORAL
  Filled 2019-04-22: qty 2

## 2019-04-22 MED ORDER — ASPIRIN 81 MG PO CHEW
81.0000 mg | CHEWABLE_TABLET | ORAL | Status: AC
  Administered 2019-04-23: 81 mg via ORAL
  Filled 2019-04-22: qty 1

## 2019-04-22 MED ORDER — SODIUM CHLORIDE 0.9 % IV SOLN
INTRAVENOUS | Status: DC
  Administered 2019-04-23: 06:00:00 via INTRAVENOUS

## 2019-04-22 NOTE — TOC Progression Note (Signed)
Transition of Care [facility]) - Progression Note    Patient Details  Name: Jon Oconnell MRN: [id number] Date of Birth: 1950-11-10  Transition of Care [facility]) CM/SW Contact  Graves-Bigelow, Ocie Cornfield, RN Phone Number: 04/22/2019, 2:58 PM  Clinical Narrative:   CM received call back from [place] with the [facility]. He provided me with information for Health Care for [facility]. CM was able to leave a detailed message on a secure line regarding patient and his housing needs. Awaiting phone call back. If patient is d/c over the weekend d/c summary and medications will need to be faxed to [phone]. CM will make patient aware of above information. No further needs at this time.     Expected Discharge Plan: Home/Self Care Barriers to Discharge: No Barriers Identified  Expected Discharge Plan and Services Expected Discharge Plan: Home/Self Care In-house Referral: NA Discharge Planning Services: CM Consult Post Acute Care Choice: NA Living arrangements for the past 2 months: Single Family Home                           HH Arranged: NA           Social Determinants of Health (SDOH) Interventions    Readmission Risk Interventions No flowsheet data found.

## 2019-04-22 NOTE — H&P (View-Only) (Signed)
Patient ID: Jon Oconnell, male   DOB: May 05, 1951, 68 y.o.   MRN: [id number]     Advanced Heart Failure Rounding Note  PCP-Cardiologist: No primary care provider on file.   Subjective:    Patient diuresed well yesterday, weight is down.  Breathing improved.  Remains in atrial fibrillation. Creatinine stable at 1.66.    Objective:   Weight Range: 79.7 kg Body mass index is 25.96 kg/m.   Vital Signs:   Temp:  [97.7 F (36.5 C)-98 F (36.7 C)] 97.7 F (36.5 C) (07/30 0500) Pulse Rate:  [80-93] 82 (07/30 0500) Resp:  [12] 12 (07/29 1417) BP: (91-143)/(61-100) 143/100 (07/30 0500) SpO2:  [94 %-97 %] 97 % (07/30 0500) Weight:  [79.7 kg] 79.7 kg (07/30 0630) Last BM Date: 04/20/19  Weight change: Filed Weights   04/21/19 0039 04/21/19 0440 04/22/19 0630  Weight: 82.2 kg 82.2 kg 79.7 kg    Intake/Output:   Intake/Output Summary (Last 24 hours) at 04/22/2019 0820 Last data filed at 04/22/2019 0745 Gross per 24 hour  Intake 780 ml  Output 3075 ml  Net -2295 ml      Physical Exam    General:  Well appearing. No resp difficulty HEENT: Normal Neck: Supple. JVP 8 cm with HJR. Carotids 2+ bilat; no bruits. No lymphadenopathy or thyromegaly appreciated. Cor: PMI nondisplaced. Regular rate & rhythm. No rubs, gallops or murmurs. Lungs: Clear Abdomen: Soft, nontender, nondistended. No hepatosplenomegaly. No bruits or masses. Good bowel sounds. Extremities: No cyanosis, clubbing, rash, edema Neuro: Alert & orientedx3, cranial nerves grossly intact. moves all 4 extremities w/o difficulty. Affect pleasant   Telemetry   Atrial fibrillation rate 90s (personally reviewed)  Labs    CBC Recent Labs    04/21/19 0445 04/22/19 0422  WBC 8.9 11.0*  NEUTROABS  --  8.6*  HGB 13.5 14.8  HCT 41.9 46.0  MCV 87.1 87.5  PLT 196 831   Basic Metabolic Panel Recent Labs    04/21/19 0445 04/22/19 0422  NA 139 140  K 4.2 4.2  CL 102 99  CO2 29 31  GLUCOSE 101* 103*  BUN 19 23   CREATININE 1.69* 1.66*  CALCIUM 8.9 9.3  MG  --  2.1   Liver Function Tests Recent Labs    04/20/19 1659 04/21/19 0445  AST 30 25  ALT 67* 55*  ALKPHOS 125 116  BILITOT 1.4* 1.5*  PROT 6.4* 5.6*  ALBUMIN 3.2* 3.0*   No results for input(s): LIPASE, AMYLASE in the last 72 hours. Cardiac Enzymes No results for input(s): CKTOTAL, CKMB, CKMBINDEX, TROPONINI in the last 72 hours.  BNP: BNP (last 3 results) Recent Labs    04/20/19 1659  BNP 2,049.6*    ProBNP (last 3 results) No results for input(s): PROBNP in the last 8760 hours.   D-Dimer No results for input(s): DDIMER in the last 72 hours. Hemoglobin A1C No results for input(s): HGBA1C in the last 72 hours. Fasting Lipid Panel No results for input(s): CHOL, HDL, LDLCALC, TRIG, CHOLHDL, LDLDIRECT in the last 72 hours. Thyroid Function Tests Recent Labs    04/20/19 2336  TSH 4.549*    Other results:   Imaging     No results found.   Medications:     Scheduled Medications: . amiodarone  400 mg Oral Daily  . digoxin  0.125 mg Oral Daily  . eplerenone  25 mg Oral Daily  . furosemide  80 mg Intravenous BID  . metoprolol succinate  50 mg Oral BID  . [  START ON 04/23/2019] torsemide  40 mg Oral Daily     Infusions:   PRN Medications:  acetaminophen, ondansetron (ZOFRAN) IV    Assessment/Plan   1. Acute on chronic systolic CHF: Patient has had a cardiomyopathy since at least 2014, at that time echo showed EF 30-35%.  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 Medtronic ICD. 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 HIV negative this admission. BNP was quite high this admission, and he looked significantly volume overloaded when I first saw him.  He diuresed well yesterday, weight down.  Mild volume overload on exam today with stable creatinine at 1.66.  - Will give 1 more dose of Lasix 80 mg IV this morning, transition to torsemide  tomorrow.   - Continue digoxin.  - Continue current Toprol XL.  - Continue eplerenone 25 mg daily.  - No indication for CRT.  - RHC/LHC would be reasonable prior to discharge to assess filling pressures and cardiac output after diuresis as well as rule out development of coronary disease given chest pain.  We discussed risks/benefits and he agrees to procedure, will plan for tomorrow.  Hold Eliquis today for cath tomorrow, can restart afterwards.  - Will hold off on ARB/ARNI today with mildly elevated creatinine, plan for cath tomorrow, and soft BP.  May start low dose losartan post-cath.  2. Atrial fibrillation: Rate is now controlled on Toprol XL and amiodarone.  He is on Eliquis.  LV thrombus noted on TEE at [facility] earlier this month so no DCCV yet.  - Continue Eliquis for a month, repeat TEE at that time with DCCV if thrombus resolved.  - He likely would be an atrial fibrillation ablation candidate (CASTLE-HF).  3. CKD stage 3: Creatinine stable at 1.66, follow closely.   Length of Stay: 1  [name], MD  04/22/2019, 8:20 AM  Advanced Heart Failure Team Pager [phone] (M-F; 7a - 4p)  Please contact [facility] for night-coverage after hours (4p -7a ) and weekends on [email or web address]

## 2019-04-22 NOTE — Progress Notes (Signed)
Patient ID: Jon Oconnell, male   DOB: [birth year], 68 y.o.   MRN: [id number]     Advanced Heart Failure Rounding Note  PCP-Cardiologist: No primary care provider on file.   Subjective:    Patient diuresed well yesterday, weight is down.  Breathing improved.  Remains in atrial fibrillation. Creatinine stable at 1.66.    Objective:   Weight Range: 79.7 kg Body mass index is 25.96 kg/m.   Vital Signs:   Temp:  [97.7 F (36.5 C)-98 F (36.7 C)] 97.7 F (36.5 C) (07/30 0500) Pulse Rate:  [80-93] 82 (07/30 0500) Resp:  [12] 12 (07/29 1417) BP: (91-143)/(61-100) 143/100 (07/30 0500) SpO2:  [94 %-97 %] 97 % (07/30 0500) Weight:  [79.7 kg] 79.7 kg (07/30 0630) Last BM Date: 04/20/19  Weight change: Filed Weights   04/21/19 0039 04/21/19 0440 04/22/19 0630  Weight: 82.2 kg 82.2 kg 79.7 kg    Intake/Output:   Intake/Output Summary (Last 24 hours) at 04/22/2019 0820 Last data filed at 04/22/2019 0745 Gross per 24 hour  Intake 780 ml  Output 3075 ml  Net -2295 ml      Physical Exam    General:  Well appearing. No resp difficulty HEENT: Normal Neck: Supple. JVP 8 cm with HJR. Carotids 2+ bilat; no bruits. No lymphadenopathy or thyromegaly appreciated. Cor: PMI nondisplaced. Regular rate & rhythm. No rubs, gallops or murmurs. Lungs: Clear Abdomen: Soft, nontender, nondistended. No hepatosplenomegaly. No bruits or masses. Good bowel sounds. Extremities: No cyanosis, clubbing, rash, edema Neuro: Alert & orientedx3, cranial nerves grossly intact. moves all 4 extremities w/o difficulty. Affect pleasant   Telemetry   Atrial fibrillation rate 90s (personally reviewed)  Labs    CBC Recent Labs    04/21/19 0445 04/22/19 0422  WBC 8.9 11.0*  NEUTROABS  --  8.6*  HGB 13.5 14.8  HCT 41.9 46.0  MCV 87.1 87.5  PLT 196 093   Basic Metabolic Panel Recent Labs    04/21/19 0445 04/22/19 0422  NA 139 140  K 4.2 4.2  CL 102 99  CO2 29 31  GLUCOSE 101* 103*  BUN 19 23   CREATININE 1.69* 1.66*  CALCIUM 8.9 9.3  MG  --  2.1   Liver Function Tests Recent Labs    04/20/19 1659 04/21/19 0445  AST 30 25  ALT 67* 55*  ALKPHOS 125 116  BILITOT 1.4* 1.5*  PROT 6.4* 5.6*  ALBUMIN 3.2* 3.0*   No results for input(s): LIPASE, AMYLASE in the last 72 hours. Cardiac Enzymes No results for input(s): CKTOTAL, CKMB, CKMBINDEX, TROPONINI in the last 72 hours.  BNP: BNP (last 3 results) Recent Labs    04/20/19 1659  BNP 2,049.6*    ProBNP (last 3 results) No results for input(s): PROBNP in the last 8760 hours.   D-Dimer No results for input(s): DDIMER in the last 72 hours. Hemoglobin A1C No results for input(s): HGBA1C in the last 72 hours. Fasting Lipid Panel No results for input(s): CHOL, HDL, LDLCALC, TRIG, CHOLHDL, LDLDIRECT in the last 72 hours. Thyroid Function Tests Recent Labs    04/20/19 2336  TSH 4.549*    Other results:   Imaging     No results found.   Medications:     Scheduled Medications: . amiodarone  400 mg Oral Daily  . digoxin  0.125 mg Oral Daily  . eplerenone  25 mg Oral Daily  . furosemide  80 mg Intravenous BID  . metoprolol succinate  50 mg Oral BID  . [  START ON 04/23/2019] torsemide  40 mg Oral Daily     Infusions:   PRN Medications:  acetaminophen, ondansetron (ZOFRAN) IV    Assessment/Plan   1. Acute on chronic systolic CHF: Patient has had a cardiomyopathy since at least 2014, at that time echo showed EF 30-35%.  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 Medtronic ICD. 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 HIV negative this admission. BNP was quite high this admission, and he looked significantly volume overloaded when I first saw him.  He diuresed well yesterday, weight down.  Mild volume overload on exam today with stable creatinine at 1.66.  - Will give 1 more dose of Lasix 80 mg IV this morning, transition to torsemide  tomorrow.   - Continue digoxin.  - Continue current Toprol XL.  - Continue eplerenone 25 mg daily.  - No indication for CRT.  - RHC/LHC would be reasonable prior to discharge to assess filling pressures and cardiac output after diuresis as well as rule out development of coronary disease given chest pain.  We discussed risks/benefits and he agrees to procedure, will plan for tomorrow.  Hold Eliquis today for cath tomorrow, can restart afterwards.  - Will hold off on ARB/ARNI today with mildly elevated creatinine, plan for cath tomorrow, and soft BP.  May start low dose losartan post-cath.  2. Atrial fibrillation: Rate is now controlled on Toprol XL and amiodarone.  He is on Eliquis.  LV thrombus noted on TEE at [facility] earlier this month so no DCCV yet.  - Continue Eliquis for a month, repeat TEE at that time with DCCV if thrombus resolved.  - He likely would be an atrial fibrillation ablation candidate (CASTLE-HF).  3. CKD stage 3: Creatinine stable at 1.66, follow closely.   Length of Stay: 1  [name], MD  04/22/2019, 8:20 AM  Advanced Heart Failure Team Pager [phone] (M-F; 7a - 4p)  Please contact [facility] for night-coverage after hours (4p -7a ) and weekends on [email or web address]

## 2019-04-22 NOTE — TOC Progression Note (Signed)
Transition of Care [facility]) - Progression Note    Patient Details  Name: Jon Oconnell MRN: [id number] Date of Birth: 18-Feb-1951  Transition of Care [facility]) CM/SW Contact  Graves-Bigelow, Ocie Cornfield, RN Phone Number: 04/22/2019, 3:38 PM  Clinical Narrative: The CSW from [facility] called CM back regarding housing situation. Patient will be placed on a waiting list for affordable housing. CM did ask for CSW to call the patient to make him aware and to get the necessary information needed to process the application. No further needs at this time from CM.       Expected Discharge Plan: Home/Self Care Barriers to Discharge: No Barriers Identified  Expected Discharge Plan and Services Expected Discharge Plan: Home/Self Care In-house Referral: NA Discharge Planning Services: CM Consult Post Acute Care Choice: NA Living arrangements for the past 2 months: Single Family Home                    HH Arranged: NA   Social Determinants of Health (SDOH) Interventions    Readmission Risk Interventions No flowsheet data found.

## 2019-04-23 ENCOUNTER — Encounter (HOSPITAL_COMMUNITY)
Admission: EM | Disposition: A | Discharge status: Home / Self Care | Payer: Self-pay | Source: Home / Self Care | Attending: Cardiovascular Disease

## 2019-04-23 DIAGNOSIS — I509 Heart failure, unspecified: Secondary | ICD-10-CM

## 2019-04-23 HISTORY — PX: RIGHT/LEFT HEART CATH AND CORONARY ANGIOGRAPHY: CATH118266

## 2019-04-23 LAB — POCT I-STAT EG7
Acid-Base Excess: 4 mmol/L — ABNORMAL HIGH (ref 0.0–2.0)
Acid-Base Excess: 4 mmol/L — ABNORMAL HIGH (ref 0.0–2.0)
Bicarbonate: 29.8 mmol/L — ABNORMAL HIGH (ref 20.0–28.0)
Bicarbonate: 30.8 mmol/L — ABNORMAL HIGH (ref 20.0–28.0)
Calcium, Ion: 1.19 mmol/L (ref 1.15–1.40)
Calcium, Ion: 1.22 mmol/L (ref 1.15–1.40)
HCT: 51 % (ref 39.0–52.0)
HCT: 51 % (ref 39.0–52.0)
Hemoglobin: 17.3 g/dL — ABNORMAL HIGH (ref 13.0–17.0)
Hemoglobin: 17.3 g/dL — ABNORMAL HIGH (ref 13.0–17.0)
O2 Saturation: 62 %
O2 Saturation: 63 %
Potassium: 4.1 mmol/L (ref 3.5–5.1)
Potassium: 4.2 mmol/L (ref 3.5–5.1)
Sodium: 138 mmol/L (ref 135–145)
Sodium: 139 mmol/L (ref 135–145)
TCO2: 31 mmol/L (ref 22–32)
TCO2: 32 mmol/L (ref 22–32)
pCO2, Ven: 49.5 mmHg (ref 44.0–60.0)
pCO2, Ven: 50.7 mmHg (ref 44.0–60.0)
pH, Ven: 7.388 (ref 7.250–7.430)
pH, Ven: 7.391 (ref 7.250–7.430)
pO2, Ven: 33 mmHg (ref 32.0–45.0)
pO2, Ven: 33 mmHg (ref 32.0–45.0)

## 2019-04-23 LAB — CBC WITH DIFFERENTIAL/PLATELET
Abs Immature Granulocytes: 0.05 10*3/uL (ref 0.00–0.07)
Basophils Absolute: 0.1 10*3/uL (ref 0.0–0.1)
Basophils Relative: 1 %
Eosinophils Absolute: 0.1 10*3/uL (ref 0.0–0.5)
Eosinophils Relative: 1 %
HCT: 46.3 % (ref 39.0–52.0)
Hemoglobin: 15.4 g/dL (ref 13.0–17.0)
Immature Granulocytes: 1 %
Lymphocytes Relative: 16 %
Lymphs Abs: 1.6 10*3/uL (ref 0.7–4.0)
MCH: 28.3 pg (ref 26.0–34.0)
MCHC: 33.3 g/dL (ref 30.0–36.0)
MCV: 85 fL (ref 80.0–100.0)
Monocytes Absolute: 0.9 10*3/uL (ref 0.1–1.0)
Monocytes Relative: 9 %
Neutro Abs: 7.7 10*3/uL (ref 1.7–7.7)
Neutrophils Relative %: 72 %
Platelets: 213 10*3/uL (ref 150–400)
RBC: 5.45 MIL/uL (ref 4.22–5.81)
RDW: 14.6 % (ref 11.5–15.5)
WBC: 10.4 10*3/uL (ref 4.0–10.5)
nRBC: 0 % (ref 0.0–0.2)

## 2019-04-23 LAB — BASIC METABOLIC PANEL
Anion gap: 10 (ref 5–15)
BUN: 27 mg/dL — ABNORMAL HIGH (ref 8–23)
CO2: 26 mmol/L (ref 22–32)
Calcium: 9.1 mg/dL (ref 8.9–10.3)
Chloride: 101 mmol/L (ref 98–111)
Creatinine, Ser: 1.65 mg/dL — ABNORMAL HIGH (ref 0.61–1.24)
GFR calc Af Amer: 48 mL/min — ABNORMAL LOW (ref >60)
GFR calc non Af Amer: 42 mL/min — ABNORMAL LOW (ref >60)
Glucose, Bld: 98 mg/dL (ref 70–99)
Potassium: 4 mmol/L (ref 3.5–5.1)
Sodium: 137 mmol/L (ref 135–145)

## 2019-04-23 LAB — DIGOXIN LEVEL: Digoxin Level: 0.5 ng/mL — ABNORMAL LOW (ref 0.8–2.0)

## 2019-04-23 SURGERY — RIGHT/LEFT HEART CATH AND CORONARY ANGIOGRAPHY
Anesthesia: LOCAL

## 2019-04-23 MED ORDER — LABETALOL HCL 5 MG/ML IV SOLN: 10.0000 mg | INTRAVENOUS | Status: AC | PRN

## 2019-04-23 MED ORDER — HEPARIN (PORCINE) IN NACL 1000-0.9 UT/500ML-% IV SOLN
INTRAVENOUS | Status: DC | PRN
  Administered 2019-04-23: 500 mL

## 2019-04-23 MED ORDER — SODIUM CHLORIDE 0.9% FLUSH: 3.0000 mL | Freq: Two times a day (BID) | INTRAVENOUS | Status: DC

## 2019-04-23 MED ORDER — ONDANSETRON HCL 4 MG/2ML IJ SOLN: 4.0000 mg | Freq: Four times a day (QID) | INTRAMUSCULAR | Status: DC | PRN

## 2019-04-23 MED ORDER — HYDRALAZINE HCL 20 MG/ML IJ SOLN: 10.0000 mg | INTRAMUSCULAR | Status: AC | PRN

## 2019-04-23 MED ORDER — FENTANYL CITRATE (PF) 100 MCG/2ML IJ SOLN
INTRAMUSCULAR | Status: AC
  Filled 2019-04-23: qty 2

## 2019-04-23 MED ORDER — HEPARIN SODIUM (PORCINE) 1000 UNIT/ML IJ SOLN
INTRAMUSCULAR | Status: DC | PRN
  Administered 2019-04-23: 4000 [IU] via INTRAVENOUS

## 2019-04-23 MED ORDER — VERAPAMIL HCL 2.5 MG/ML IV SOLN
INTRAVENOUS | Status: DC | PRN
  Administered 2019-04-23: 10 mL via INTRA_ARTERIAL

## 2019-04-23 MED ORDER — ACETAMINOPHEN 325 MG PO TABS: 650.0000 mg | ORAL_TABLET | ORAL | Status: DC | PRN

## 2019-04-23 MED ORDER — APIXABAN 5 MG PO TABS
5.0000 mg | ORAL_TABLET | Freq: Two times a day (BID) | ORAL | Status: DC
  Administered 2019-04-23 – 2019-04-24 (×2): 5 mg via ORAL
  Filled 2019-04-23 (×2): qty 1

## 2019-04-23 MED ORDER — SODIUM CHLORIDE 0.9 % IV SOLN
INTRAVENOUS | Status: AC
  Administered 2019-04-23: 13:00:00 via INTRAVENOUS

## 2019-04-23 MED ORDER — MIDAZOLAM HCL 2 MG/2ML IJ SOLN
INTRAMUSCULAR | Status: DC | PRN
  Administered 2019-04-23: 1 mg via INTRAVENOUS

## 2019-04-23 MED ORDER — LIDOCAINE HCL (PF) 1 % IJ SOLN
INTRAMUSCULAR | Status: AC
  Filled 2019-04-23: qty 30

## 2019-04-23 MED ORDER — MIDAZOLAM HCL 2 MG/2ML IJ SOLN
INTRAMUSCULAR | Status: AC
  Filled 2019-04-23: qty 2

## 2019-04-23 MED ORDER — LIDOCAINE HCL (PF) 1 % IJ SOLN
INTRAMUSCULAR | Status: DC | PRN
  Administered 2019-04-23 (×2): 2 mL

## 2019-04-23 MED ORDER — HEPARIN (PORCINE) IN NACL 1000-0.9 UT/500ML-% IV SOLN
INTRAVENOUS | Status: AC
  Filled 2019-04-23: qty 1000

## 2019-04-23 MED ORDER — IOHEXOL 350 MG/ML SOLN
INTRAVENOUS | Status: DC | PRN
  Administered 2019-04-23: 30 mL via INTRA_ARTERIAL

## 2019-04-23 MED ORDER — FENTANYL CITRATE (PF) 100 MCG/2ML IJ SOLN
INTRAMUSCULAR | Status: DC | PRN
  Administered 2019-04-23: 25 ug via INTRAVENOUS

## 2019-04-23 MED ORDER — SODIUM CHLORIDE 0.9 % IV SOLN: 250.0000 mL | INTRAVENOUS | Status: DC | PRN

## 2019-04-23 MED ORDER — HEPARIN SODIUM (PORCINE) 1000 UNIT/ML IJ SOLN
INTRAMUSCULAR | Status: AC
  Filled 2019-04-23: qty 1

## 2019-04-23 MED ORDER — VERAPAMIL HCL 2.5 MG/ML IV SOLN
INTRAVENOUS | Status: AC
  Filled 2019-04-23: qty 2

## 2019-04-23 MED ORDER — SODIUM CHLORIDE 0.9% FLUSH: 3.0000 mL | INTRAVENOUS | Status: DC | PRN

## 2019-04-23 SURGICAL SUPPLY — 10 items
CATH 5FR JL3.5 JR4 ANG PIG MP (CATHETERS) ×2 IMPLANT
CATH BALLN WEDGE 5F 110CM (CATHETERS) ×2 IMPLANT
DEVICE RAD COMP TR BAND LRG (VASCULAR PRODUCTS) ×2 IMPLANT
GUIDEWIRE INQWIRE 1.5J.035X260 (WIRE) ×1 IMPLANT
INQWIRE 1.5J .035X260CM (WIRE) ×2
KIT HEART LEFT (KITS) ×2 IMPLANT
PACK CARDIAC CATHETERIZATION (CUSTOM PROCEDURE TRAY) ×2 IMPLANT
SHEATH GLIDE SLENDER 4/5FR (SHEATH) ×2 IMPLANT
SHEATH RAIN RADIAL 21G 6FR (SHEATH) ×2 IMPLANT
TRANSDUCER W/STOPCOCK (MISCELLANEOUS) ×2 IMPLANT

## 2019-04-23 NOTE — Progress Notes (Addendum)
Patient ID: Jon Oconnell, male   DOB: September 24, 1950, 68 y.o.   MRN: [id number]     Advanced Heart Failure Rounding Note  PCP-Cardiologist: No primary care provider on file.   Subjective:    Yesterday diuresed with dose of IV lasix.  Weight down another 2 pounds. Creatinine unchanged 1.6.   Denies chest pain. Denies SOB.   Objective:   Weight Range: 78.6 kg Body mass index is 25.58 kg/m.   Vital Signs:   Temp:  [97.9 F (36.6 C)-98.1 F (36.7 C)] 98.1 F (36.7 C) (07/31 0556) Pulse Rate:  [78-93] 78 (07/31 0556) Resp:  [12-18] 12 (07/31 0556) BP: (98-100)/(73-78) 100/74 (07/31 0556) SpO2:  [97 %-99 %] 97 % (07/31 0556) Weight:  [78.6 kg] 78.6 kg (07/31 0600) Last BM Date: 04/20/19  Weight change: Filed Weights   04/21/19 0440 04/22/19 0630 04/23/19 0600  Weight: 82.2 kg 79.7 kg 78.6 kg    Intake/Output:   Intake/Output Summary (Last 24 hours) at 04/23/2019 0826 Last data filed at 04/23/2019 0500 Gross per 24 hour  Intake 3 ml  Output 650 ml  Net -647 ml      Physical Exam   General:   No resp difficulty HEENT: normal Neck: supple. JVP 6-7. Carotids 2+ bilat; no bruits. No lymphadenopathy or thryomegaly appreciated. Cor: PMI nondisplaced. Irregular rate & rhythm. No rubs, gallops or murmurs. Lungs: clear Abdomen: soft, nontender, nondistended. No hepatosplenomegaly. No bruits or masses. Good bowel sounds. Extremities: no cyanosis, clubbing, rash, edema Neuro: alert & orientedx3, cranial nerves grossly intact. moves all 4 extremities w/o difficulty. Affect pleasant   Telemetry   A fib 80-90s personally reviewed.   Labs    CBC Recent Labs    04/22/19 0422 04/23/19 0444  WBC 11.0* 10.4  NEUTROABS 8.6* 7.7  HGB 14.8 15.4  HCT 46.0 46.3  MCV 87.5 85.0  PLT 212 151   Basic Metabolic Panel Recent Labs    04/22/19 0422 04/23/19 0444  NA 140 137  K 4.2 4.0  CL 99 101  CO2 31 26  GLUCOSE 103* 98  BUN 23 27*  CREATININE 1.66* 1.65*  CALCIUM 9.3  9.1  MG 2.1  --    Liver Function Tests Recent Labs    04/20/19 1659 04/21/19 0445  AST 30 25  ALT 67* 55*  ALKPHOS 125 116  BILITOT 1.4* 1.5*  PROT 6.4* 5.6*  ALBUMIN 3.2* 3.0*   No results for input(s): LIPASE, AMYLASE in the last 72 hours. Cardiac Enzymes No results for input(s): CKTOTAL, CKMB, CKMBINDEX, TROPONINI in the last 72 hours.  BNP: BNP (last 3 results) Recent Labs    04/20/19 1659  BNP 2,049.6*    ProBNP (last 3 results) No results for input(s): PROBNP in the last 8760 hours.   D-Dimer No results for input(s): DDIMER in the last 72 hours. Hemoglobin A1C No results for input(s): HGBA1C in the last 72 hours. Fasting Lipid Panel No results for input(s): CHOL, HDL, LDLCALC, TRIG, CHOLHDL, LDLDIRECT in the last 72 hours. Thyroid Function Tests Recent Labs    04/20/19 2336  TSH 4.549*    Other results:   Imaging    No results found.   Medications:     Scheduled Medications: . amiodarone  400 mg Oral Daily  . digoxin  0.125 mg Oral Daily  . eplerenone  25 mg Oral Daily  . metoprolol succinate  50 mg Oral BID  . sodium chloride flush  3 mL Intravenous Q12H  . torsemide  40 mg Oral Daily    Infusions: . sodium chloride    . sodium chloride 10 mL/hr at 04/23/19 0606    PRN Medications: sodium chloride, acetaminophen, ondansetron (ZOFRAN) IV, sodium chloride flush    Assessment/Plan   1. Acute on chronic systolic CHF: Patient has had a cardiomyopathy since at least 2014, at that time echo showed EF 30-35%.  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 Medtronic ICD. 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 HIV negative this admission. BNP was quite high this admission, and he looked significantly volume overloaded .  - Volume status improved. Creatinine stable.  - Restarting torsemide.   -  Continue digoxin.  - Continue current Toprol XL.  - Continue eplerenone 25 mg  daily.  - No indication for CRT.  - RHC/LHC today.  2. Atrial fibrillation: Rate is now controlled on Toprol XL and amiodarone.  He is on Eliquis.  LV thrombus noted on TEE at [facility] earlier this month so no DCCV yet.  - Continue Eliquis for a month, repeat TEE at that time with DCCV if thrombus resolved.  - He likely would be an atrial fibrillation ablation candidate (CASTLE-HF).  -Rate controlled.  3. CKD stage 3: Creatinine unchanged.    RHC/LHC today 1200. Adjust diuretics after cath.   Length of Stay: 2  Jon Grinder, NP  04/23/2019, 8:26 AM  Advanced Heart Failure Team Pager [phone] (M-F; 7a - 4p)  Please contact [facility] for night-coverage after hours (4p -7a ) and weekends on [email or web address]  RHC/LHC done today as below:  Coronary Findings  Diagnostic Dominance: Right Left Main  There is no left main coronary artery.  Left Anterior Descending  The LAD originates anomalously off the right cusp. No significant coronary disease.  Left Circumflex  The LCx originates anomalously off the right cusp. No significant coronary disease.  Right Coronary Artery  The RCA originates normally off the right cusp. No significant coronary disease.  Intervention  No interventions have been documented. Right Heart  Right Heart Pressures RHC Procedural Findings: Hemodynamics (mmHg) RA mean 3 RV 29/2 PA 34/14, mean 21 PCWP mean 10 LV 95/14 AO 94/68  Oxygen saturations: PA 63% AO 98%  Cardiac Output (Fick) 3.55  Cardiac Index (Fick) 1.81   Catheterization shows that left and right heart filling pressures are optimized.  No angiographic coronary disease, but the LCx and LAD originated separately and anomalously off the right cusp.  Minimal contrast was used (30 cc), will gently hydrate post-cath.   I will plan to discharge Jon Oconnell tomorrow morning.  I will restart apixaban this evening.  If creatinine remains stable, will start him on losartan tomorrow morning.  He will need  TEE-guided DCCV in about a month.  With low output, will continue digoxin for now.  I will not start milrinone as he is compensated in terms of volume and feels good.   In terms of the anomalous coronaries, would ideally do a coronary CTA to assess the course (?interarterial) of the LCx and LAD.  However, would not give him another contrast load at this time given CKD.  This can be followed up on as an outpatient.   [name] 04/23/2019 1:22 PM

## 2019-04-23 NOTE — Progress Notes (Signed)
TR BAND REMOVAL  LOCATION:    right radial  DEFLATED PER PROTOCOL:    Yes.    TIME BAND OFF / DRESSING APPLIED: 1645   SITE UPON ARRIVAL:    Level 0  SITE AFTER BAND REMOVAL:    Level 0  CIRCULATION SENSATION AND MOVEMENT:    Within Normal Limits   Yes.    COMMENTS:     

## 2019-04-23 NOTE — Interval H&P Note (Signed)
History and Physical Interval Note:  04/23/2019 12:33 PM  Jon Oconnell  has presented today for surgery, with the diagnosis of heart failure.  The various methods of treatment have been discussed with the patient and family. After consideration of risks, benefits and other options for treatment, the patient has consented to  Procedure(s): RIGHT/LEFT HEART CATH AND CORONARY ANGIOGRAPHY (N/A) as a surgical intervention.  The patient's history has been reviewed, patient examined, no change in status, stable for surgery.  I have reviewed the patient's chart and labs.  Questions were answered to the patient's satisfaction.     [name] [employer]

## 2019-04-23 NOTE — Progress Notes (Signed)
HPI: Follow-up CHF and atrial fibrillation.  Patient was noted to have an ejection fraction of 30 to 35% in 2014.  Cardiac catheterization at that time showed no obstructive coronary artery disease.  It was felt his cardiomyopathy may be related to chemotherapy used to treat non-Hodgkin's lymphoma.  Also with history of ICD.  Recently admitted to [facility] with CHF symptoms and atrial fibrillation.  It was noted at that time that he had had a recent TEE at [facility] that showed left atrial appendage thrombus; ejection fraction 25 to 30%.  Cardiac catheterization July 2020 showed no significant coronary disease; all coronaries arise from the right cusp.  Pulmonary capillary wedge pressure 10.  Echocardiogram August 2020 showed ejection fraction 25 to 30%, mild left atrial enlargement and moderate tricuspid regurgitation.  Plan was medical therapy for congestive heart failure.  Would continue anticoagulation for 4 to 6 weeks then repeat TEE and if negative for left atrial appendage thrombus proceed with cardioversion.  Since patient was discharged he is much improved.  He has dyspnea with more vigorous activities but not routine activities.  His orthopnea, PND and pedal edema have resolved.  There is no chest pain or syncope.  Current Outpatient Medications  Medication Sig Dispense Refill  . albuterol (VENTOLIN HFA) 108 (90 Base) MCG/ACT inhaler Inhale 2 puffs into the lungs every 6 (six) hours as needed for wheezing or shortness of breath.    Marland Kitchen amiodarone (PACERONE) 200 MG tablet Take 400 mg (2 tablets) daily for 7 days, then decrease to 200 mg (1 tablet) daily. 90 tablet 3  . apixaban (ELIQUIS) 5 MG TABS tablet Take 5 mg by mouth 2 (two) times daily.    . digoxin (LANOXIN) 0.125 MG tablet Take 0.125 mg by mouth daily.    Marland Kitchen eplerenone (INSPRA) 25 MG tablet Take 1 tablet (25 mg total) by mouth daily. 90 tablet 3  . metoprolol succinate (TOPROL-XL) 25 MG 24 hr tablet Take 1 tablet (25 mg  total) by mouth 2 (two) times a day. Take with or immediately following a meal. 180 tablet 3  . omeprazole (PRILOSEC) 20 MG capsule Take 40 mg by mouth daily.    . sertraline (ZOLOFT) 100 MG tablet Take 50 mg by mouth daily.    . simvastatin (ZOCOR) 80 MG tablet Take 40 mg by mouth at bedtime.    . torsemide (DEMADEX) 20 MG tablet Take 2 tablets (40 mg total) by mouth daily. 90 tablet 3  . UNKNOWN TO PATIENT Inhale 1 ampule into the lungs See admin instructions. Unknown to patient (neb solution): Inhale the contents of 1 ampule into the lungs every 6 hours as needed for shortness of breath or wheezing     No current facility-administered medications for this visit.      Past Medical History:  Diagnosis Date  . AICD (automatic cardioverter/defibrillator) present   . Atrial fibrillation with RVR ([place]) 04/08/2019  . Cardiac defibrillator in place 04/08/2019  . Chronic congestive heart failure ([place]) 04/08/2019  . Depression with anxiety 04/08/2019  . Dilated cardiomyopathy ([place]) 04/09/2019  . Dyspnea    with exertion  . Dysrhythmia    A-fib  . Hyperlipidemia 04/08/2019  . 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 NICM (nonischemic cardiomyopathy) ([place])   . Non-ischemic cardiomyopathy ([place]) 03/03/2017  . 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 ICD IMPLANT Left   . RIGHT/LEFT HEART  CATH AND CORONARY ANGIOGRAPHY N/A 04/23/2019   Procedure: RIGHT/LEFT HEART CATH AND CORONARY ANGIOGRAPHY;  Surgeon: Larey Dresser, MD;  Location: [facility] [facility];  Service: Cardiovascular;  Laterality: N/A;    Social History   Socioeconomic History  . Marital status: Married    Spouse name: Not on file  . Number of children: Not on file  . Years of education: Not on file  . Highest education level: Not on file  Occupational History  . Not on file  Social Needs  . Financial resource strain: Not on file  . Food insecurity     Worry: Not on file    Inability: Not on file  . Transportation needs    Medical: Not on file    Non-medical: Not on file  Tobacco Use  . Smoking status: Former Research scientist (life sciences)  . Smokeless tobacco: Never Used  Substance and Sexual Activity  . Alcohol use: Not Currently  . Drug use: Never  . Sexual activity: Not Currently  Lifestyle  . Physical activity    Days per week: Not on file    Minutes per session: Not on file  . Stress: Not on file  Relationships  . Social Herbalist on phone: Not on file    Gets together: Not on file    Attends religious service: Not on file    Active member of club or organization: Not on file    Attends meetings of clubs or organizations: Not on file    Relationship status: Not on file  . Intimate partner violence    Fear of current or ex partner: Not on file    Emotionally abused: Not on file    Physically abused: Not on file    Forced sexual activity: Not on file  Other Topics Concern  . Not on file  Social History Narrative  . Not on file    Family History  Problem Relation Age of Onset  . Breast cancer Mother   . Stroke Father     ROS: no fevers or chills, productive cough, hemoptysis, dysphasia, odynophagia, melena, hematochezia, dysuria, hematuria, rash, seizure activity, orthopnea, PND, pedal edema, claudication. Remaining systems are negative.  Physical Exam: Well-developed well-nourished in no acute distress.  Skin is warm and dry.  HEENT is normal.  Neck is supple.  Chest is clear to auscultation with normal expansion.  Cardiovascular exam is irregular Abdominal exam nontender or distended. No masses palpated. Extremities show no edema. neuro grossly intact  ECG-atrial fibrillation with occasional ventricular pacing, left anterior fascicular block, septal infarct, nonspecific ST changes.  Personally reviewed  A/P  1 nonischemic cardiomyopathy-this is felt potentially secondary to previous chemotherapy.  Recent cardiac  catheterization did not reveal obstructive coronary disease.  Plan to continue medical therapy.  Continue beta-blocker.  We will not add an ARB or Entresto at this point as his blood pressure is borderline do not think he will tolerate.  We will consider in the future.  2 chronic systolic congestive heart failure-he is markedly improved following recent admission for CHF.  Continue present dose of diuretic.  Check potassium and renal function.  We will also check digoxin level at that time.  Given baseline renal insufficiency and amiodarone interaction will need to follow closely.  3 persistent atrial fibrillation-plan to continue apixaban, beta-blocker, digoxin and amiodarone (continue 400 mg twice daily for 1 week total then decrease to 200 mg daily).  We will need to check TSH, liver functions and chest  x-ray in 6 months.  Previous TEE apparently showed left atrial appendage thrombus.  I will see him back in 6 to 8 weeks and we will arrange TEE guided cardioversion following that appointment.  4 prior ICD-we will ask Dr. Curt Bears to see in [facility] to follow his device.  5 chronic stage III kidney disease-plan recheck potassium and renal function.  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  If renal function improves we could consider CTA to rule out malignant course of left coronary in the future.  However given age I think this is unlikely.  Kirk Ruths, MD

## 2019-04-24 ENCOUNTER — Other Ambulatory Visit: Payer: Self-pay | Admitting: Physician Assistant

## 2019-04-24 DIAGNOSIS — I5022 Chronic systolic (congestive) heart failure: Secondary | ICD-10-CM

## 2019-04-24 DIAGNOSIS — I4891 Unspecified atrial fibrillation: Secondary | ICD-10-CM

## 2019-04-24 LAB — CBC WITH DIFFERENTIAL/PLATELET
Abs Immature Granulocytes: 0.05 10*3/uL (ref 0.00–0.07)
Basophils Absolute: 0.1 10*3/uL (ref 0.0–0.1)
Basophils Relative: 1 %
Eosinophils Absolute: 0.1 10*3/uL (ref 0.0–0.5)
Eosinophils Relative: 1 %
HCT: 49.9 % (ref 39.0–52.0)
Hemoglobin: 16.4 g/dL (ref 13.0–17.0)
Immature Granulocytes: 1 %
Lymphocytes Relative: 18 %
Lymphs Abs: 1.7 10*3/uL (ref 0.7–4.0)
MCH: 28.3 pg (ref 26.0–34.0)
MCHC: 32.9 g/dL (ref 30.0–36.0)
MCV: 86 fL (ref 80.0–100.0)
Monocytes Absolute: 0.7 10*3/uL (ref 0.1–1.0)
Monocytes Relative: 8 %
Neutro Abs: 7 10*3/uL (ref 1.7–7.7)
Neutrophils Relative %: 71 %
Platelets: 237 10*3/uL (ref 150–400)
RBC: 5.8 MIL/uL (ref 4.22–5.81)
RDW: 14.7 % (ref 11.5–15.5)
WBC: 9.6 10*3/uL (ref 4.0–10.5)
nRBC: 0 % (ref 0.0–0.2)

## 2019-04-24 LAB — BASIC METABOLIC PANEL
Anion gap: 13 (ref 5–15)
BUN: 27 mg/dL — ABNORMAL HIGH (ref 8–23)
CO2: 25 mmol/L (ref 22–32)
Calcium: 9 mg/dL (ref 8.9–10.3)
Chloride: 99 mmol/L (ref 98–111)
Creatinine, Ser: 1.7 mg/dL — ABNORMAL HIGH (ref 0.61–1.24)
GFR calc Af Amer: 47 mL/min — ABNORMAL LOW (ref >60)
GFR calc non Af Amer: 40 mL/min — ABNORMAL LOW (ref >60)
Glucose, Bld: 98 mg/dL (ref 70–99)
Potassium: 4 mmol/L (ref 3.5–5.1)
Sodium: 137 mmol/L (ref 135–145)

## 2019-04-24 MED ORDER — AMIODARONE HCL 200 MG PO TABS: ORAL_TABLET | ORAL | 3 refills | Status: DC

## 2019-04-24 MED ORDER — METOPROLOL SUCCINATE ER 25 MG PO TB24: 25.0000 mg | ORAL_TABLET | Freq: Two times a day (BID) | ORAL | 3 refills | Status: DC

## 2019-04-24 MED ORDER — TORSEMIDE 20 MG PO TABS: 40.0000 mg | ORAL_TABLET | Freq: Every day | ORAL | Status: DC

## 2019-04-24 MED ORDER — EPLERENONE 25 MG PO TABS: 25.0000 mg | ORAL_TABLET | Freq: Every day | ORAL | 3 refills | Status: DC

## 2019-04-24 MED ORDER — METOPROLOL SUCCINATE ER 25 MG PO TB24
25.0000 mg | ORAL_TABLET | Freq: Two times a day (BID) | ORAL | Status: DC
  Administered 2019-04-24: 25 mg via ORAL
  Filled 2019-04-24: qty 1

## 2019-04-24 MED ORDER — TORSEMIDE 20 MG PO TABS: 40.0000 mg | ORAL_TABLET | Freq: Every day | ORAL | 3 refills | Status: DC

## 2019-04-24 NOTE — Progress Notes (Signed)
Digoxin and BMP week of 04/26/19.

## 2019-04-24 NOTE — Progress Notes (Signed)
Patient ID: Jon Nebel, male   DOB: 12-09-1950, 68 y.o.   MRN: [id number]     Advanced Heart Failure Rounding Note  PCP-Cardiologist: No primary care provider on file.   Subjective:    No complaints today, no chest pain or dyspnea.  SBP upper 80s overnight but he denies lightheadedness, says BP runs this way at home at times.   RHC/LHC 7/31 as below:  Coronary Findings  Diagnostic Dominance: Right Left Main  There is no left main coronary artery.  Left Anterior Descending  The LAD originates anomalously off the right cusp. No significant coronary disease.  Left Circumflex  The LCx originates anomalously off the right cusp. No significant coronary disease.  Right Coronary Artery  The RCA originates normally off the right cusp. No significant coronary disease.  Intervention  No interventions have been documented. Right Heart  Right Heart Pressures RHC Procedural Findings: Hemodynamics (mmHg) RA mean 3 RV 29/2 PA 34/14, mean 21 PCWP mean 10 LV 95/14 AO 94/68  Oxygen saturations: PA 63% AO 98%  Cardiac Output (Fick) 3.55  Cardiac Index (Fick) 1.81     Objective:   Weight Range: 78.8 kg Body mass index is 25.65 kg/m.   Vital Signs:   Temp:  [97.7 F (36.5 C)-97.9 F (36.6 C)] 97.9 F (36.6 C) (08/01 0419) Pulse Rate:  [39-118] 70 (08/01 0623) Resp:  [7-116] 19 (08/01 0419) BP: (84-117)/(62-84) 87/67 (08/01 0718) SpO2:  [92 %-100 %] 92 % (08/01 0419) Weight:  [78.8 kg] 78.8 kg (08/01 0419) Last BM Date: 04/23/19  Weight change: Filed Weights   04/22/19 0630 04/23/19 0600 04/24/19 0419  Weight: 79.7 kg 78.6 kg 78.8 kg    Intake/Output:   Intake/Output Summary (Last 24 hours) at 04/24/2019 0809 Last data filed at 04/23/2019 2220 Gross per 24 hour  Intake 621.94 ml  Output 975 ml  Net -353.06 ml      Physical Exam    General: NAD Neck: No JVD, no thyromegaly or thyroid nodule.  Lungs: Clear to auscultation bilaterally with normal respiratory  effort. CV: Nondisplaced PMI.  Heart irregular S1/S2, no S3/S4, no murmur.  No peripheral edema.   Abdomen: Soft, nontender, no hepatosplenomegaly, no distention.  Skin: Intact without lesions or rashes.  Neurologic: Alert and oriented x 3.  Psych: Normal affect. Extremities: No clubbing or cyanosis.  HEENT: Normal.    Telemetry   Atrial fibrillation rate 70s personally reviewed.   Labs    CBC Recent Labs    04/23/19 0444  04/23/19 1246 04/24/19 0358  WBC 10.4  --   --  9.6  NEUTROABS 7.7  --   --  7.0  HGB 15.4   < > 17.3* 16.4  HCT 46.3   < > 51.0 49.9  MCV 85.0  --   --  86.0  PLT 213  --   --  237   < > = values in this interval not displayed.   Basic Metabolic Panel Recent Labs    04/22/19 0422 04/23/19 0444  04/23/19 1246 04/24/19 0358  NA 140 137   < > 139 137  K 4.2 4.0   < > 4.2 4.0  CL 99 101  --   --  99  CO2 31 26  --   --  25  GLUCOSE 103* 98  --   --  98  BUN 23 27*  --   --  27*  CREATININE 1.66* 1.65*  --   --  1.70*  CALCIUM  9.3 9.1  --   --  9.0  MG 2.1  --   --   --   --    < > = values in this interval not displayed.   Liver Function Tests No results for input(s): AST, ALT, ALKPHOS, BILITOT, PROT, ALBUMIN in the last 72 hours. No results for input(s): LIPASE, AMYLASE in the last 72 hours. Cardiac Enzymes No results for input(s): CKTOTAL, CKMB, CKMBINDEX, TROPONINI in the last 72 hours.  BNP: BNP (last 3 results) Recent Labs    04/20/19 1659  BNP 2,049.6*    ProBNP (last 3 results) No results for input(s): PROBNP in the last 8760 hours.   D-Dimer No results for input(s): DDIMER in the last 72 hours. Hemoglobin A1C No results for input(s): HGBA1C in the last 72 hours. Fasting Lipid Panel No results for input(s): CHOL, HDL, LDLCALC, TRIG, CHOLHDL, LDLDIRECT in the last 72 hours. Thyroid Function Tests No results for input(s): TSH, T4TOTAL, T3FREE, THYROIDAB in the last 72 hours.  Invalid input(s): FREET3  Other results:    Imaging    No results found.   Medications:     Scheduled Medications: . amiodarone  400 mg Oral Daily  . apixaban  5 mg Oral BID  . digoxin  0.125 mg Oral Daily  . eplerenone  25 mg Oral Daily  . metoprolol succinate  25 mg Oral BID  . sodium chloride flush  3 mL Intravenous Q12H  . sodium chloride flush  3 mL Intravenous Q12H  . torsemide  40 mg Oral Daily    Infusions: . sodium chloride      PRN Medications: sodium chloride, acetaminophen, ondansetron (ZOFRAN) IV, sodium chloride flush    Assessment/Plan   1. 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  He had a cath this admission that showed anomalous coronaries but no significant CAD. Medtronic ICD. 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 HIV negative this admission. TEE in [place] in 7/20 showed EF 25-30% with diffuse hypokinesis, normal RV, mild MR, moderate TR. CHF exacerbation appears to have been triggered by new-onset atrial fibrillation.  BNP was quite high this admission, and he looked significantly volume overloaded.  Volume status much improved on RHC yesterday, CI low at 1.8. He feels good, no dyspnea and now looks euvolemic.  SBP in 80s-90s overnight.  - Can hold torsemide today, restart tomorrow at 40 mg daily.   - Continue digoxin.  - Decrease Toprol XL to 25 mg bid.  - Continue eplerenone 25 mg daily.  - QRS not wide, no indication for CRT upgrade.  - Will not start ACEI/ARNI with soft BP, reassess for this as outpatient.  2. Atrial fibrillation: Rate is now controlled on Toprol XL and amiodarone.  He is on Eliquis.  LV thrombus noted on TEE at [facility] earlier this month so no DCCV yet. Suspect onset of atrial fibrillation worsened his baseline CHF.  - Continue Eliquis for a month, repeat TEE at that time with DCCV if thrombus resolved.  - He  likely would be an atrial fibrillation ablation candidate ([facility]-HF).   3. CKD stage 3: Creatinine fairly stable at 1.7.  Minimal dye with cath yesterday (30 cc).   4. 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  It would be helpful to do a coronary CTA to assess the  courses of the LAD/LCx (?malignant interarterial).  However, he is 64 and does not appear to have had an arrhythmia or chest pain related to anomalous coronaries so would be very unlikely to recommend CABG, etc. This can be done electively if creatinine improves.  5. Disposition: He can go home today.  Will need BMET and digoxin level some time this week.  Followup with me in CHF clinic in 10-14 days.  Meds for discharge: Digoxin 0.125 daily, torsemide 40 mg daily, eplerenone 25 daily, Toprol XL 25 bid, Eliquis 5 mg bid, amiodarone 400 mg daily x 1 week then 200 mg daily after that.   Length of Stay: 3  [name], MD  04/24/2019, 8:09 AM  Advanced Heart Failure Team Pager [phone] (M-F; 7a - 4p)  Please contact [facility] for night-coverage after hours (4p -7a ) and weekends on [email or web address]

## 2019-04-24 NOTE — Discharge Summary (Signed)
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  Left Anterior Descending  The LAD originates anomalously off the right cusp. No significant coronary disease.  Left Circumflex  The LCx originates anomalously off the right cusp. No significant coronary disease.  Right Coronary Artery  The RCA originates normally off the right cusp. No significant coronary disease.  Intervention  No interventions have been documented. Right Heart  Right Heart Pressures RHC Procedural Findings: Hemodynamics (mmHg) RA mean 3 RV 29/2 PA 34/14, mean 21 PCWP mean 10 LV 95/14 AO 94/68  Oxygen saturations: PA 63% AO 98%  Cardiac Output (Fick) 3.55  Cardiac Index (Fick) 1.81    Echocardiogram 04/09/2019: A complete portable two-dimensional transthoracic echocardiogram with color flow Doppler and Spectral Doppler was performed. The study was technically adequate. The left ventricle is moderately dilated. There is normal left ventricular wall thickness. There is severe diffuse hypokinesis of the left ventricle. The left ventricular ejection fraction is markedly reduced (25-30%). Unable to adequately determine diastolic dysfunction. There is a  defibrillator lead in the right ventricle. The left atrium is mildly dilated. There is moderate (2+) tricuspid regurgitation. Right ventricular systolic pressure is elevated between 40-38mm Hg, consistent with moderate pulmonary hypertension.   History of Present Illness     Jon Oconnell is a 68 y.o. with history of nonischemic cardiomyopathy, prior non-Hodgkins lymphoma, and atrial fibrillation was admitted with decompensated CHF.  His presenting symptoms were fatigue and weakness.   Patient has a long-standing history of CHF.  In 2014, EF was 30-35%.  Cath at that time showed no obstructive CAD.  He has a Medtronic ICD.  He recently moved to [place]. About 2 wks ago, he was seen at the [facility] in [place] due to worsening dyspnea and palpitations.  He was found to be in atrial fibrillation with RVR.  He was started on Eliquis and TEE-guided DCCV was planned, but TEE showed LA thrombus so he did not have the cardioversion.  The TEE showed EF 25-30% with diffuse hypokinesis, normal RV, mild MR, moderate TR.  He was discharged home on Lasix 20 mg daily. Since then, he has continued to be short of breath with exertion.    He came to the ER at [facility] on 7/28 due to shortness of breath and was admitted with decompensated CHF.  He also has been having chest tightness, nonexertional.  He has been in atrial fibrillation but HR has been reasonably well-controlled (he is on amiodarone and Toprol XL).  He is currently on po Lasix.  He is short of breath with exertion still.  Troponin was not elevated and COVID-19 was negative. BNP was 2049 with sCr of 1.61.  Hospital Course     Consultants: none  Acute on chronic systolic heart failure Nonischemic cardiomyopathy Suspect CHF  induced from chemotherapy received for non-Hodgkins lymphoma. Suspect Afib has worsened his baseline heart failure.  BNP on admission was 2049 and appeared volume overloaded. EF this admission 25-30% with  moderate tricuspid regurgitation and moderate pulmonary hypertension. Right heart cath following diuresis with cardiac index 1.81; he appears euvolemic. Discharge with: - 40 mg torsemide starting 04/25/19 - digoxin 0.125 mg daily - toprol 25 mg BID - eplerenone 25 mg daily - no ACEI/ARB for marginal BP - will reassess at OP visit   Atrial fibrillation Chronic anticoagulation Left atrial thrombus on TEE 04/12/19 ([facility]) Will defer DCCV for at least 1 month, then repeat TEE for possible DCCV if thrombus is resolved. Rate controlled on toprol and amiodarone and anticoagulated with eliquis.  QRS narrow, no indictation for CRT upgrade. Will continue amiodarone 400 mg x 1 week, then decrease to 200 mg thereafter. Continue eliquis 5 mg BID. May be a candidate for ablation.  This patients CHA2DS2-VASc Score and unadjusted Ischemic Stroke Rate (% per year) is equal to 2.2 % stroke rate/year from a score of 2 (CHF, age)  Above score calculated as 1 point each if present [CHF, HTN, DM, Vascular=MI/PAD/Aortic Plaque, Age if 19-74, or Male] Above score calculated as 2 points each if present [Age > 75, or Stroke/TIA/TE]     CKD stage III sCr stable at 1.7 (1.6). Baseline is unclear. Will recheck BMP in 1 week.   Coronary artery anomalies Chest tightness Per cath this admission, the RCA, LCx, and LAD all originate from separate ostia off the right cusp. Consider CT coronary to better-examine anatomy, depending on creatinine. No chest pain or arrhythmias, although patient did report some left-sided chest tightness on admission. HS troponin x 2 negative. EKG  With poor R wave progression and mild TWI in V5-6, no prior tracings for comparison. No obstructive disease by heart cath.    ASA not continued in the setting of eliquis.  Follow up BMP and digoxin level next week. Orders placed.   _____________  Discharge Vitals Blood pressure 91/65, pulse 72, temperature 97.9 F (36.6 C), temperature  source Oral, resp. rate 19, height 5\' 9"  (1.753 m), weight 78.8 kg, SpO2 92 %.  Filed Weights   04/22/19 0630 04/23/19 0600 04/24/19 0419  Weight: 79.7 kg 78.6 kg 78.8 kg    Labs & Radiologic Studies    CBC Recent Labs    04/23/19 0444  04/23/19 1246 04/24/19 0358  WBC 10.4  --   --  9.6  NEUTROABS 7.7  --   --  7.0  HGB 15.4   < > 17.3* 16.4  HCT 46.3   < > 51.0 49.9  MCV 85.0  --   --  86.0  PLT 213  --   --  237   < > = values in this interval not displayed.   Basic Metabolic Panel Recent Labs    04/22/19 0422 04/23/19 0444  04/23/19 1246 04/24/19 0358  NA 140 137   < > 139 137  K 4.2 4.0   < > 4.2 4.0  CL 99 101  --   --  99  CO2 31 26  --   --  25  GLUCOSE 103* 98  --   --  98  BUN 23 27*  --   --  27*  CREATININE 1.66* 1.65*  --   --  1.70*  CALCIUM 9.3 9.1  --   --  9.0  MG 2.1  --   --   --   --    < > =  values in this interval not displayed.   Liver Function Tests No results for input(s): AST, ALT, ALKPHOS, BILITOT, PROT, ALBUMIN in the last 72 hours. No results for input(s): LIPASE, AMYLASE in the last 72 hours. Cardiac Enzymes No results for input(s): CKTOTAL, CKMB, CKMBINDEX, TROPONINI in the last 72 hours. BNP Invalid input(s): POCBNP D-Dimer No results for input(s): DDIMER in the last 72 hours. Hemoglobin A1C No results for input(s): HGBA1C in the last 72 hours. Fasting Lipid Panel No results for input(s): CHOL, HDL, LDLCALC, TRIG, CHOLHDL, LDLDIRECT in the last 72 hours. Thyroid Function Tests No results for input(s): TSH, T4TOTAL, T3FREE, THYROIDAB in the last 72 hours.  Invalid input(s): FREET3 _____________  Dg Chest Port 1 View  Result Date: 04/20/2019 CLINICAL DATA:  Shortness of breath, generalized weakness for 2 weeks EXAM: PORTABLE CHEST 1 VIEW COMPARISON:  None. FINDINGS: There is a small left pleural effusion. There is left basilar airspace disease. There is right perihilar airspace disease. At there is no pneumothorax. Hit the  heart mediastinum are stable. There is a cardiac pacemaker present. There is no acute osseous abnormality. IMPRESSION: Small left pleural effusion with left basilar airspace disease which may reflect atelectasis versus pneumonia. Right perihilar airspace disease concerning for pneumonia. Electronically Signed   By: Kathreen Devoid   On: 04/20/2019 17:41   Disposition   Pt is being discharged home today in good condition.  Follow-up Plans & Appointments    Follow-up Information    [facility] Follow up.   Why: Health Care for [phone] ext [phone] Contact information: [place] [address] [phone]        [facility].   Specialty: Cardiology Why: We will call with an appointment.   Please have labs drawn by 04/30/19. Contact information: [address] [id number] [place] [phone]         Discharge Instructions    Diet - low sodium heart healthy   Complete by: As directed    Increase activity slowly   Complete by: As directed       Discharge Medications   Allergies as of 04/24/2019   No Known Allergies     Medication List    STOP taking these medications   aspirin EC 81 MG tablet   furosemide 20 MG tablet Commonly known as: LASIX   ivabradine 5 MG Tabs tablet Commonly known as: CORLANOR   losartan 25 MG tablet Commonly known as: COZAAR     TAKE these medications   amiodarone 200 MG tablet Commonly known as: PACERONE Take 400 mg (2 tablets) daily for 7 days, then decrease to 200 mg (1 tablet) daily. What changed:   how much to take  how to take this  when to take this  additional instructions   digoxin 0.125 MG tablet Commonly known as: LANOXIN Take 0.125 mg by mouth daily.   Eliquis 5 MG Tabs tablet Generic drug: apixaban Take 5 mg by mouth 2 (two) times daily.   eplerenone 25 MG  tablet Commonly known as: INSPRA Take 1 tablet (25 mg total) by mouth daily. What changed:   how much to take  when to take this   metoprolol succinate 25 MG 24 hr tablet Commonly known as: TOPROL-XL Take 1 tablet (25 mg total) by mouth 2 (two) times a day. Take with or immediately following a meal. What changed:   medication strength  how much  to take   omeprazole 20 MG capsule Commonly known as: PRILOSEC Take 40 mg by mouth daily.   sertraline 100 MG tablet Commonly known as: ZOLOFT Take 50 mg by mouth daily.   simvastatin 80 MG tablet Commonly known as: ZOCOR Take 40 mg by mouth at bedtime.   torsemide 20 MG tablet Commonly known as: DEMADEX Take 2 tablets (40 mg total) by mouth daily. Start taking on: April 25, 2019   UNKNOWN TO PATIENT Inhale 1 ampule into the lungs See admin instructions. Unknown to patient (neb solution): Inhale the contents of 1 ampule into the lungs every 6 hours as needed for shortness of breath or wheezing   Ventolin HFA 108 (90 Base) MCG/ACT inhaler Generic drug: albuterol Inhale 2 puffs into the lungs every 6 (six) hours as needed for wheezing or shortness of breath.        Acute coronary syndrome (MI, NSTEMI, STEMI, etc) this admission?: No.    Outstanding Labs/Studies   BMP and digoxin level next week.  I have messaged for fu appt in AHF clinic.  Duration of Discharge Encounter   Greater than 30 minutes including physician time.  Signed, [place]ck, PA 04/24/2019, 10:02 AM

## 2019-04-24 NOTE — Progress Notes (Signed)
Spoke with [name] on unit verbal okay to give BB with SBP in the 80's. I will continue to monitor the patient closely.   Saddie Benders RN BSN

## 2019-04-24 NOTE — Progress Notes (Signed)
Blood pressures have been soft overnight, most recent 84/63, MAP 71.  Patient has been and continues to be asymptomatic.  Cardiology paged to update.

## 2019-04-24 NOTE — Care Management (Addendum)
DC summary faxed to [place] [phone].  Pt's prescriptions sent to [facility].  Patient states he can get them filled at [facility] on [facility]. In [place] with no problem.  Prescriptions will be changed.  RN aware.

## 2019-04-26 ENCOUNTER — Telehealth: Payer: Self-pay | Admitting: Cardiology

## 2019-04-26 ENCOUNTER — Other Ambulatory Visit (HOSPITAL_COMMUNITY): Payer: Self-pay

## 2019-04-26 ENCOUNTER — Telehealth (HOSPITAL_COMMUNITY): Payer: Self-pay | Admitting: Cardiology

## 2019-04-26 NOTE — Telephone Encounter (Signed)
Routed to primary nurse  New patient visit 04/28/2019

## 2019-04-26 NOTE — Telephone Encounter (Signed)
New message   Per Anderson Malta would like to have the BMET lab work done on the day that the patient comes in for the office visit.

## 2019-04-26 NOTE — Telephone Encounter (Signed)
Called and left message for pt to call back.  Left appt info for 05/06/2019 @10 :20 with DM, asked pt to call back to confirm.

## 2019-04-28 ENCOUNTER — Other Ambulatory Visit: Payer: Self-pay

## 2019-04-28 ENCOUNTER — Ambulatory Visit (INDEPENDENT_AMBULATORY_CARE_PROVIDER_SITE_OTHER): Payer: No Typology Code available for payment source | Admitting: Cardiology

## 2019-04-28 ENCOUNTER — Encounter: Payer: Self-pay | Admitting: *Deleted

## 2019-04-28 ENCOUNTER — Encounter: Payer: Self-pay | Admitting: Cardiology

## 2019-04-28 VITALS — BP 90/70 | HR 65 | Temp 98.3°F | Ht 69.0 in | Wt 171.0 lb

## 2019-04-28 DIAGNOSIS — I4891 Unspecified atrial fibrillation: Secondary | ICD-10-CM

## 2019-04-28 DIAGNOSIS — I420 Dilated cardiomyopathy: Secondary | ICD-10-CM

## 2019-04-28 DIAGNOSIS — I513 Intracardiac thrombosis, not elsewhere classified: Secondary | ICD-10-CM

## 2019-04-28 DIAGNOSIS — I5022 Chronic systolic (congestive) heart failure: Secondary | ICD-10-CM

## 2019-04-28 NOTE — Patient Instructions (Signed)
Medication Instructions:  NO CHANGE If you need a refill on your cardiac medications before your next appointment, please call your pharmacy.   Lab work: Your physician recommends that you return for lab work in: ONE WEEK-DO NOT TAKE DIGOXIN THE MORNING THE LAB WORK IS TO BE DRAWN If you have labs (blood work) drawn today and your tests are completely normal, you will receive your results only by: Marland Kitchen MyChart Message (if you have MyChart) OR . A paper copy in the mail If you have any lab test that is abnormal or we need to change your treatment, we will call you to review the results.  Follow-Up: At [facility], you and your health needs are our priority.  As part of our continuing mission to provide you with exceptional heart care, we have created designated Provider Care Teams.  These Care Teams include your primary Cardiologist (physician) and Advanced Practice Providers (APPs -  Physician Assistants and Nurse Practitioners) who all work together to provide you with the care you need, when you need it. Your physician recommends that you schedule a follow-up appointment in: 6-8 [place] [facility] TO ESTABLISH AND DEFIB CHECK

## 2019-05-06 ENCOUNTER — Ambulatory Visit (HOSPITAL_COMMUNITY)
Admission: RE | Admit: 2019-05-06 | Discharge: 2019-05-06 | Disposition: A | Discharge status: Home / Self Care | Payer: No Typology Code available for payment source | Source: Ambulatory Visit | Attending: Cardiology | Admitting: Cardiology

## 2019-05-06 ENCOUNTER — Other Ambulatory Visit: Payer: Self-pay

## 2019-05-06 ENCOUNTER — Other Ambulatory Visit (HOSPITAL_COMMUNITY): Payer: Self-pay

## 2019-05-06 ENCOUNTER — Encounter (HOSPITAL_COMMUNITY): Payer: Self-pay | Admitting: Cardiology

## 2019-05-06 ENCOUNTER — Encounter (HOSPITAL_COMMUNITY): Payer: Self-pay

## 2019-05-06 VITALS — BP 104/68 | HR 69 | Wt 174.8 lb

## 2019-05-06 DIAGNOSIS — Z803 Family history of malignant neoplasm of breast: Secondary | ICD-10-CM | POA: Insufficient documentation

## 2019-05-06 DIAGNOSIS — Z823 Family history of stroke: Secondary | ICD-10-CM | POA: Insufficient documentation

## 2019-05-06 DIAGNOSIS — I5022 Chronic systolic (congestive) heart failure: Secondary | ICD-10-CM | POA: Diagnosis not present

## 2019-05-06 DIAGNOSIS — N183 Chronic kidney disease, stage 3 (moderate): Secondary | ICD-10-CM | POA: Insufficient documentation

## 2019-05-06 DIAGNOSIS — I4891 Unspecified atrial fibrillation: Secondary | ICD-10-CM | POA: Diagnosis not present

## 2019-05-06 DIAGNOSIS — Q245 Malformation of coronary vessels: Secondary | ICD-10-CM | POA: Insufficient documentation

## 2019-05-06 DIAGNOSIS — Z95810 Presence of automatic (implantable) cardiac defibrillator: Secondary | ICD-10-CM | POA: Insufficient documentation

## 2019-05-06 DIAGNOSIS — Z7901 Long term (current) use of anticoagulants: Secondary | ICD-10-CM | POA: Insufficient documentation

## 2019-05-06 DIAGNOSIS — I428 Other cardiomyopathies: Secondary | ICD-10-CM | POA: Insufficient documentation

## 2019-05-06 DIAGNOSIS — Z87891 Personal history of nicotine dependence: Secondary | ICD-10-CM | POA: Insufficient documentation

## 2019-05-06 DIAGNOSIS — Z79899 Other long term (current) drug therapy: Secondary | ICD-10-CM | POA: Insufficient documentation

## 2019-05-06 DIAGNOSIS — Z8572 Personal history of non-Hodgkin lymphomas: Secondary | ICD-10-CM | POA: Insufficient documentation

## 2019-05-06 LAB — BASIC METABOLIC PANEL
Anion gap: 13 (ref 5–15)
BUN: 23 mg/dL (ref 8–23)
CO2: 27 mmol/L (ref 22–32)
Calcium: 9.7 mg/dL (ref 8.9–10.3)
Chloride: 97 mmol/L — ABNORMAL LOW (ref 98–111)
Creatinine, Ser: 1.65 mg/dL — ABNORMAL HIGH (ref 0.61–1.24)
GFR calc Af Amer: 48 mL/min — ABNORMAL LOW (ref >60)
GFR calc non Af Amer: 42 mL/min — ABNORMAL LOW (ref >60)
Glucose, Bld: 103 mg/dL — ABNORMAL HIGH (ref 70–99)
Potassium: 4.2 mmol/L (ref 3.5–5.1)
Sodium: 137 mmol/L (ref 135–145)

## 2019-05-06 LAB — DIGOXIN LEVEL: Digoxin Level: 0.9 ng/mL (ref 0.8–2.0)

## 2019-05-06 MED ORDER — LOSARTAN POTASSIUM 25 MG PO TABS: 12.5000 mg | ORAL_TABLET | Freq: Every day | ORAL | 3 refills | Status: DC

## 2019-05-06 NOTE — Patient Instructions (Addendum)
START Losartan 12.5mg  (1/2 tab) daily  Labs today We will only contact you if something comes back abnormal or we need to make some changes. Otherwise no news is good news!  Repeat Labs on Monday 05/17/19 12pm at Heart and Vascular clinic.  You are scheduled to have a COVID screen on 05/17/19 at 12:50p at the [facility].  Once this test is completed, you are expected to remain quarantined until the morning of your procedure to limit potential for exposure   You are scheduled for TEE/DCCV on Wednesday August 26th, 2020 with Dr [name].  Please arrive at the Main Entrance A at [facility] at 6:30a.  (See added instruction sheet for more information)  Your physician has requested that you have a TEE/Cardioversion. During a TEE, sound waves are used to create images of your heart. It provides your doctor with information about the size and shape of your heart and how well your heart's chambers and valves are working. In this test, a transducer is attached to the end of a flexible tube that is guided down you throat and into your esophagus (the tube leading from your mouth to your stomach) to get a more detailed image of your heart. Once the TEE has determined that a blood clot is not present, the cardioversion begins. Electrical Cardioversion uses a jolt of electricity to your heart either through paddles or wired patches attached to your chest. This is a controlled, usually prescheduled, procedure. This procedure is done at the hospital and you are not awake during the procedure. You usually go home the day of the procedure. Please see the instruction sheet given to you today for more information.   Your physician recommends that you schedule a follow-up appointment in: 6 weeks with Dr [name]

## 2019-05-06 NOTE — H&P (View-Only) (Signed)
PCP: [facility] Cardiology: Dr. Stanford Breed HF Cardiology: Dr. [name]  68 y.o. with history of nonischemic cardiomyopathy, prior non-Hodgkins lymphoma, and atrial fibrillation returns for followup of CHF and atrial fibrillation.    Patient has a long-standing history of CHF.  In 2014, EF was 30-35%.  Cath at that time showed no obstructive CAD.  He has a Medtronic ICD. He recently moved to [place]. In 7/20, he was seen at the [facility] in [place] due to worsening dyspnea and palpitations.  He was found to be in atrial fibrillation with RVR.  He was started on Eliquis and TEE-guided DCCV was planned, but TEE showed LA thrombus so he did not have the cardioversion.  The TEE showed EF 25-30% with diffuse hypokinesis, normal RV, mild MR, moderate TR.  He was discharged home on Lasix 20 mg daily. Since then, he continued to be short of breath with exertion.    He was then admitted to [facility] on 04/20/19 with decompensated CHF.  He was diuresed.  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    He has been doing well since getting home.  He now has no significant exertional dyspnea.  No problems walking on flat ground.  No orthopnea/PND.  No lightheadedness, orthopnea, or PND.  No chest pain.  He remains in atrial fibrillation.   ECG (personally reviewed): atrial fibrillation, LAFB, poor RWP  Labs (8/20): K 4, creatinine 1.7  Past Medical History: 1. Non-Hodgkins lymphoma: Remote, in remission.  2. Atrial fibrillation: First noted in 7/20.  TEE in 7/20 showed left atrial appendage thrombus.  3. Chronic systolic CHF: Nonischemic cardiomyopathy.  Echo in 2014 with EF 30-35%, cath at that time showed no significant CAD.  - He has a Medtronic ICD.  - TEE (7/20): EF 25-30% with diffuse hypokinesis, normal RV, moderate TR, mild MR.  There was a LA appendage  thrombus.  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  Mean RA 3, PA 34/14, mean PCWP 10, CI 1.81.  4. CKD: Stage 3.   Social History   Socioeconomic History  . Marital status: Married    Spouse name: Not on file  . Number of children: Not on file  . Years of education: Not on file  . Highest education level: Not on file  Occupational History  . Not on file  Social Needs  . Financial resource strain: Not on file  . Food insecurity    Worry: Not on file    Inability: Not on file  . Transportation needs    Medical: Not on file    Non-medical: Not on file  Tobacco Use  . Smoking status: Former Research scientist (life sciences)  . Smokeless tobacco: Never Used  Substance and Sexual Activity  . Alcohol use: Not Currently  . Drug use: Never  . Sexual activity: Not Currently  Lifestyle  . Physical activity    Days per week: Not on file    Minutes per session: Not on file  . Stress: Not on file  Relationships  . Social Herbalist on phone: Not on file    Gets together: Not on file    Attends religious service: Not on file    Active member of club or organization: Not on file    Attends meetings of clubs  or organizations: Not on file    Relationship status: Not on file  . Intimate partner violence    Fear of current or ex partner: Not on file    Emotionally abused: Not on file    Physically abused: Not on file    Forced sexual activity: Not on file  Other Topics Concern  . Not on file  Social History Narrative  . Not on file   Family History  Problem Relation Age of Onset  . Breast cancer Mother   . Stroke Father    ROS: All systems reviewed and negative except as per HPI.   Current Outpatient Medications  Medication Sig Dispense Refill  . albuterol (VENTOLIN HFA) 108 (90 Base) MCG/ACT inhaler Inhale 2 puffs into the lungs every 6 (six) hours as needed for wheezing or  shortness of breath.    Marland Kitchen amiodarone (PACERONE) 200 MG tablet Take 400 mg (2 tablets) daily for 7 days, then decrease to 200 mg (1 tablet) daily. 90 tablet 3  . apixaban (ELIQUIS) 5 MG TABS tablet Take 5 mg by mouth 2 (two) times daily.    . digoxin (LANOXIN) 0.125 MG tablet Take 0.125 mg by mouth daily.    Marland Kitchen eplerenone (INSPRA) 25 MG tablet Take 1 tablet (25 mg total) by mouth daily. 90 tablet 3  . metoprolol succinate (TOPROL-XL) 25 MG 24 hr tablet Take 1 tablet (25 mg total) by mouth 2 (two) times a day. Take with or immediately following a meal. 180 tablet 3  . omeprazole (PRILOSEC) 20 MG capsule Take 40 mg by mouth daily.    . sertraline (ZOLOFT) 100 MG tablet Take 50 mg by mouth daily.    . simvastatin (ZOCOR) 80 MG tablet Take 40 mg by mouth at bedtime.    . torsemide (DEMADEX) 20 MG tablet Take 2 tablets (40 mg total) by mouth daily. 90 tablet 3  . UNKNOWN TO PATIENT Inhale 1 ampule into the lungs See admin instructions. Unknown to patient (neb solution): Inhale the contents of 1 ampule into the lungs every 6 hours as needed for shortness of breath or wheezing    . losartan (COZAAR) 25 MG tablet Take 0.5 tablets (12.5 mg total) by mouth at bedtime. 45 tablet 3   No current facility-administered medications for this encounter.    BP 104/68   Pulse 69   Wt 79.3 kg (174 lb 12.8 oz)   SpO2 98%   BMI 25.81 kg/m  General: NAD Neck: No JVD, no thyromegaly or thyroid nodule.  Lungs: Clear to auscultation bilaterally with normal respiratory effort. CV: Nondisplaced PMI.  Heart irregular S1/S2, no S3/S4, no murmur.  No peripheral edema.  No carotid bruit.  Normal pedal pulses.  Abdomen: Soft, nontender, no hepatosplenomegaly, no distention.  Skin: Intact without lesions or rashes.  Neurologic: Alert and oriented x 3.  Psych: Normal affect. Extremities: No clubbing or cyanosis.  HEENT: Normal.   Assessment/Plan: 1. Chronic systolic CHF: Patient has had a nonischemic cardiomyopathy since  at least 2014, at that time echo showed EF 30-35%. 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 Medtronic ICD. 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 HIV negative 7/20. TEE in [place] in 7/20 showed EF 25-30% with diffuse hypokinesis, normal RV, mild MR, moderate TR. Recent CHF exacerbation appears to have been triggered by new-onset atrial fibrillation. RHC in 8/20 showed normal filling pressures but low cardiac output.  Today, he appears  euvolemic.  NYHA class II symptoms.  - Continue torsemide 40 mg daily, check BMET today.  - Continue digoxin, check digoxin level today.   - Continue Toprol XL 25 mg bid.  - Continue eplerenone 25 mg daily.  - QRS not wide, no indication for CRT upgrade.  - Add low dose losartan 12.5 mg daily with close monitoring of creatinine (BMET in 10 days).  2. Atrial fibrillation: Rate is now controlled on Toprol XL and amiodarone. He is on Eliquis. LV thrombus noted on TEE at [facility] in 7/20 so no DCCV yet.  - Continue Eliquis for a month, repeat TEE at that time with DCCV if thrombus resolved => will arrange for the end of August.  - He likely would be an atrial fibrillation ablation candidate ([facility]LE-HF).   3. CKD stage 3: BMET today, last creatinine 1.7.  4. 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  It would be helpful to do a coronary CTA to assess the courses of the LAD/LCx (?malignant interarterial).  However, he is 59 and does not appear to have had an arrhythmia or chest pain related to anomalous coronaries so would be very unlikely to recommend CABG, etc. This can be done electively if creatinine improves.   [name] 05/06/2019

## 2019-05-06 NOTE — Progress Notes (Signed)
PCP: [facility] Cardiology: Dr. Stanford Breed HF Cardiology: Dr. [name]  68 y.o. with history of nonischemic cardiomyopathy, prior non-Hodgkins lymphoma, and atrial fibrillation returns for followup of CHF and atrial fibrillation.    Patient has a long-standing history of CHF.  In 2014, EF was 30-35%.  Cath at that time showed no obstructive CAD.  He has a Medtronic ICD. He recently moved to [place]. In 7/20, he was seen at the [facility] in [place] due to worsening dyspnea and palpitations.  He was found to be in atrial fibrillation with RVR.  He was started on Eliquis and TEE-guided DCCV was planned, but TEE showed LA thrombus so he did not have the cardioversion.  The TEE showed EF 25-30% with diffuse hypokinesis, normal RV, mild MR, moderate TR.  He was discharged home on Lasix 20 mg daily. Since then, he continued to be short of breath with exertion.    He was then admitted to [facility] on 04/20/19 with decompensated CHF.  He was diuresed.  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    He has been doing well since getting home.  He now has no significant exertional dyspnea.  No problems walking on flat ground.  No orthopnea/PND.  No lightheadedness, orthopnea, or PND.  No chest pain.  He remains in atrial fibrillation.   ECG (personally reviewed): atrial fibrillation, LAFB, poor RWP  Labs (8/20): K 4, creatinine 1.7  Past Medical History: 1. Non-Hodgkins lymphoma: Remote, in remission.  2. Atrial fibrillation: First noted in 7/20.  TEE in 7/20 showed left atrial appendage thrombus.  3. Chronic systolic CHF: Nonischemic cardiomyopathy.  Echo in 2014 with EF 30-35%, cath at that time showed no significant CAD.  - He has a Medtronic ICD.  - TEE (7/20): EF 25-30% with diffuse hypokinesis, normal RV, moderate TR, mild MR.  There was a LA appendage  thrombus.  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  Mean RA 3, PA 34/14, mean PCWP 10, CI 1.81.  4. CKD: Stage 3.   Social History   Socioeconomic History  . Marital status: Married    Spouse name: Not on file  . Number of children: Not on file  . Years of education: Not on file  . Highest education level: Not on file  Occupational History  . Not on file  Social Needs  . Financial resource strain: Not on file  . Food insecurity    Worry: Not on file    Inability: Not on file  . Transportation needs    Medical: Not on file    Non-medical: Not on file  Tobacco Use  . Smoking status: Former Research scientist (life sciences)  . Smokeless tobacco: Never Used  Substance and Sexual Activity  . Alcohol use: Not Currently  . Drug use: Never  . Sexual activity: Not Currently  Lifestyle  . Physical activity    Days per week: Not on file    Minutes per session: Not on file  . Stress: Not on file  Relationships  . Social Herbalist on phone: Not on file    Gets together: Not on file    Attends religious service: Not on file    Active member of club or organization: Not on file    Attends meetings of clubs  or organizations: Not on file    Relationship status: Not on file  . Intimate partner violence    Fear of current or ex partner: Not on file    Emotionally abused: Not on file    Physically abused: Not on file    Forced sexual activity: Not on file  Other Topics Concern  . Not on file  Social History Narrative  . Not on file   Family History  Problem Relation Age of Onset  . Breast cancer Mother   . Stroke Father    ROS: All systems reviewed and negative except as per HPI.   Current Outpatient Medications  Medication Sig Dispense Refill  . albuterol (VENTOLIN HFA) 108 (90 Base) MCG/ACT inhaler Inhale 2 puffs into the lungs every 6 (six) hours as needed for wheezing or  shortness of breath.    Marland Kitchen amiodarone (PACERONE) 200 MG tablet Take 400 mg (2 tablets) daily for 7 days, then decrease to 200 mg (1 tablet) daily. 90 tablet 3  . apixaban (ELIQUIS) 5 MG TABS tablet Take 5 mg by mouth 2 (two) times daily.    . digoxin (LANOXIN) 0.125 MG tablet Take 0.125 mg by mouth daily.    Marland Kitchen eplerenone (INSPRA) 25 MG tablet Take 1 tablet (25 mg total) by mouth daily. 90 tablet 3  . metoprolol succinate (TOPROL-XL) 25 MG 24 hr tablet Take 1 tablet (25 mg total) by mouth 2 (two) times a day. Take with or immediately following a meal. 180 tablet 3  . omeprazole (PRILOSEC) 20 MG capsule Take 40 mg by mouth daily.    . sertraline (ZOLOFT) 100 MG tablet Take 50 mg by mouth daily.    . simvastatin (ZOCOR) 80 MG tablet Take 40 mg by mouth at bedtime.    . torsemide (DEMADEX) 20 MG tablet Take 2 tablets (40 mg total) by mouth daily. 90 tablet 3  . UNKNOWN TO PATIENT Inhale 1 ampule into the lungs See admin instructions. Unknown to patient (neb solution): Inhale the contents of 1 ampule into the lungs every 6 hours as needed for shortness of breath or wheezing    . losartan (COZAAR) 25 MG tablet Take 0.5 tablets (12.5 mg total) by mouth at bedtime. 45 tablet 3   No current facility-administered medications for this encounter.    BP 104/68   Pulse 69   Wt 79.3 kg (174 lb 12.8 oz)   SpO2 98%   BMI 25.81 kg/m  General: NAD Neck: No JVD, no thyromegaly or thyroid nodule.  Lungs: Clear to auscultation bilaterally with normal respiratory effort. CV: Nondisplaced PMI.  Heart irregular S1/S2, no S3/S4, no murmur.  No peripheral edema.  No carotid bruit.  Normal pedal pulses.  Abdomen: Soft, nontender, no hepatosplenomegaly, no distention.  Skin: Intact without lesions or rashes.  Neurologic: Alert and oriented x 3.  Psych: Normal affect. Extremities: No clubbing or cyanosis.  HEENT: Normal.   Assessment/Plan: 1. Chronic systolic CHF: Patient has had a nonischemic cardiomyopathy since  at least 2014, at that time echo showed EF 30-35%. 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 Medtronic ICD. 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 HIV negative 7/20. TEE in [place] in 7/20 showed EF 25-30% with diffuse hypokinesis, normal RV, mild MR, moderate TR. Recent CHF exacerbation appears to have been triggered by new-onset atrial fibrillation. RHC in 8/20 showed normal filling pressures but low cardiac output.  Today, he appears  euvolemic.  NYHA class II symptoms.  - Continue torsemide 40 mg daily, check BMET today.  - Continue digoxin, check digoxin level today.   - Continue Toprol XL 25 mg bid.  - Continue eplerenone 25 mg daily.  - QRS not wide, no indication for CRT upgrade.  - Add low dose losartan 12.5 mg daily with close monitoring of creatinine (BMET in 10 days).  2. Atrial fibrillation: Rate is now controlled on Toprol XL and amiodarone. He is on Eliquis. LV thrombus noted on TEE at [facility] in 7/20 so no DCCV yet.  - Continue Eliquis for a month, repeat TEE at that time with DCCV if thrombus resolved => will arrange for the end of August.  - He likely would be an atrial fibrillation ablation candidate (CASTLE-HF).   3. CKD stage 3: BMET today, last creatinine 1.7.  4. 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  It would be helpful to do a coronary CTA to assess the courses of the LAD/LCx (?malignant interarterial).  However, he is 10 and does not appear to have had an arrhythmia or chest pain related to anomalous coronaries so would be very unlikely to recommend CABG, etc. This can be done electively if creatinine improves.   [name] 05/06/2019

## 2019-05-14 ENCOUNTER — Telehealth (HOSPITAL_COMMUNITY): Payer: Self-pay | Admitting: *Deleted

## 2019-05-14 NOTE — Telephone Encounter (Signed)
Patient left VM stating the pre service center called him about DCCV and asked him how did he choose to pay. Patient said he would have to pay out of pocket and had some concerns. Called pt back to get more information no answer/left vm for him to call back.

## 2019-05-17 ENCOUNTER — Other Ambulatory Visit (HOSPITAL_COMMUNITY)
Admission: RE | Admit: 2019-05-17 | Discharge: 2019-05-17 | Disposition: A | Discharge status: Home / Self Care | Payer: No Typology Code available for payment source | Source: Ambulatory Visit | Attending: Cardiology | Admitting: Cardiology

## 2019-05-17 ENCOUNTER — Other Ambulatory Visit: Payer: Self-pay

## 2019-05-17 ENCOUNTER — Ambulatory Visit (HOSPITAL_COMMUNITY)
Admission: RE | Admit: 2019-05-17 | Discharge: 2019-05-17 | Disposition: A | Discharge status: Home / Self Care | Payer: No Typology Code available for payment source | Source: Ambulatory Visit | Attending: Cardiology | Admitting: Cardiology

## 2019-05-17 DIAGNOSIS — I4891 Unspecified atrial fibrillation: Secondary | ICD-10-CM

## 2019-05-17 DIAGNOSIS — Z01812 Encounter for preprocedural laboratory examination: Secondary | ICD-10-CM | POA: Insufficient documentation

## 2019-05-17 DIAGNOSIS — Z20828 Contact with and (suspected) exposure to other viral communicable diseases: Secondary | ICD-10-CM | POA: Insufficient documentation

## 2019-05-17 LAB — BASIC METABOLIC PANEL
Anion gap: 11 (ref 5–15)
BUN: 20 mg/dL (ref 8–23)
CO2: 30 mmol/L (ref 22–32)
Calcium: 9.6 mg/dL (ref 8.9–10.3)
Chloride: 97 mmol/L — ABNORMAL LOW (ref 98–111)
Creatinine, Ser: 1.48 mg/dL — ABNORMAL HIGH (ref 0.61–1.24)
GFR calc Af Amer: 55 mL/min — ABNORMAL LOW (ref >60)
GFR calc non Af Amer: 48 mL/min — ABNORMAL LOW (ref >60)
Glucose, Bld: 109 mg/dL — ABNORMAL HIGH (ref 70–99)
Potassium: 3.9 mmol/L (ref 3.5–5.1)
Sodium: 138 mmol/L (ref 135–145)

## 2019-05-17 LAB — CBC
HCT: 46.7 % (ref 39.0–52.0)
Hemoglobin: 15.2 g/dL (ref 13.0–17.0)
MCH: 28.2 pg (ref 26.0–34.0)
MCHC: 32.5 g/dL (ref 30.0–36.0)
MCV: 86.6 fL (ref 80.0–100.0)
Platelets: 173 10*3/uL (ref 150–400)
RBC: 5.39 MIL/uL (ref 4.22–5.81)
RDW: 13.9 % (ref 11.5–15.5)
WBC: 11.2 10*3/uL — ABNORMAL HIGH (ref 4.0–10.5)
nRBC: 0 % (ref 0.0–0.2)

## 2019-05-17 LAB — PROTIME-INR
INR: 1.3 — ABNORMAL HIGH (ref 0.8–1.2)
Prothrombin Time: 15.6 seconds — ABNORMAL HIGH (ref 11.4–15.2)

## 2019-05-17 LAB — SARS CORONAVIRUS 2 (TAT 6-24 HRS): SARS Coronavirus 2: NEGATIVE

## 2019-05-18 ENCOUNTER — Telehealth: Payer: Self-pay | Admitting: Nurse Practitioner

## 2019-05-18 ENCOUNTER — Telehealth (HOSPITAL_COMMUNITY): Payer: Self-pay

## 2019-05-18 NOTE — Telephone Encounter (Signed)
Received call from [facility] in [facility] Pre-cert that patient will have to pay out-of-pocket for TEE scheduled for tomorrow. She states she spoke with the patient but she is not sure that he understands fully but he did verbalize that he is aware the [place] requires that he sees their cardiologist and has all procedures there.  I called HF clinic and gave this information to [place].

## 2019-05-18 NOTE — Telephone Encounter (Signed)
Received call from Dr. Julious Payer office stating that the patient seemed confused about his tee/dccv for 05/19/19. [facility] will not cover procedure and pt will have to pay out of pocket if he wishes to proceed. Called the pt; left voicemail, called the pt emergency contact: wife and left a voicemail.

## 2019-05-19 ENCOUNTER — Ambulatory Visit (HOSPITAL_COMMUNITY)
Admission: RE | Admit: 2019-05-19 | Discharge: 2019-05-19 | Disposition: A | Discharge status: Home / Self Care | Payer: Self-pay | Attending: Cardiology | Admitting: Cardiology

## 2019-05-19 ENCOUNTER — Ambulatory Visit (HOSPITAL_BASED_OUTPATIENT_CLINIC_OR_DEPARTMENT_OTHER)
Admission: RE | Admit: 2019-05-19 | Discharge: 2019-05-19 | Disposition: A | Discharge status: Home / Self Care | Payer: No Typology Code available for payment source | Source: Home / Self Care | Attending: Cardiology | Admitting: Cardiology

## 2019-05-19 ENCOUNTER — Encounter (HOSPITAL_COMMUNITY): Payer: Self-pay | Admitting: *Deleted

## 2019-05-19 ENCOUNTER — Ambulatory Visit (HOSPITAL_COMMUNITY): Payer: Self-pay | Admitting: Certified Registered Nurse Anesthetist

## 2019-05-19 ENCOUNTER — Other Ambulatory Visit: Payer: Self-pay

## 2019-05-19 ENCOUNTER — Encounter (HOSPITAL_COMMUNITY)
Admission: RE | Disposition: A | Discharge status: Home / Self Care | Payer: Self-pay | Source: Home / Self Care | Attending: Cardiology

## 2019-05-19 DIAGNOSIS — I4891 Unspecified atrial fibrillation: Secondary | ICD-10-CM | POA: Diagnosis not present

## 2019-05-19 DIAGNOSIS — Z8572 Personal history of non-Hodgkin lymphomas: Secondary | ICD-10-CM | POA: Insufficient documentation

## 2019-05-19 DIAGNOSIS — I5022 Chronic systolic (congestive) heart failure: Secondary | ICD-10-CM | POA: Insufficient documentation

## 2019-05-19 DIAGNOSIS — I428 Other cardiomyopathies: Secondary | ICD-10-CM | POA: Insufficient documentation

## 2019-05-19 DIAGNOSIS — Z7901 Long term (current) use of anticoagulants: Secondary | ICD-10-CM | POA: Insufficient documentation

## 2019-05-19 DIAGNOSIS — Z95810 Presence of automatic (implantable) cardiac defibrillator: Secondary | ICD-10-CM | POA: Insufficient documentation

## 2019-05-19 DIAGNOSIS — Z87891 Personal history of nicotine dependence: Secondary | ICD-10-CM | POA: Insufficient documentation

## 2019-05-19 DIAGNOSIS — N183 Chronic kidney disease, stage 3 (moderate): Secondary | ICD-10-CM | POA: Insufficient documentation

## 2019-05-19 DIAGNOSIS — Z79899 Other long term (current) drug therapy: Secondary | ICD-10-CM | POA: Insufficient documentation

## 2019-05-19 HISTORY — PX: TEE WITHOUT CARDIOVERSION: SHX5443

## 2019-05-19 HISTORY — PX: CARDIOVERSION: SHX1299

## 2019-05-19 SURGERY — ECHOCARDIOGRAM, TRANSESOPHAGEAL
Anesthesia: Monitor Anesthesia Care

## 2019-05-19 MED ORDER — PHENYLEPHRINE 40 MCG/ML (10ML) SYRINGE FOR IV PUSH (FOR BLOOD PRESSURE SUPPORT)
PREFILLED_SYRINGE | INTRAVENOUS | Status: DC | PRN
  Administered 2019-05-19: 80 ug via INTRAVENOUS

## 2019-05-19 MED ORDER — APIXABAN 5 MG PO TABS
5.0000 mg | ORAL_TABLET | ORAL | Status: AC
  Administered 2019-05-19: 5 mg via ORAL
  Filled 2019-05-19: qty 1

## 2019-05-19 MED ORDER — PROPOFOL 10 MG/ML IV BOLUS
INTRAVENOUS | Status: DC | PRN
  Administered 2019-05-19 (×3): 20 mg via INTRAVENOUS

## 2019-05-19 MED ORDER — PROPOFOL 500 MG/50ML IV EMUL
INTRAVENOUS | Status: DC | PRN
  Administered 2019-05-19: 100 ug/kg/min via INTRAVENOUS

## 2019-05-19 MED ORDER — SODIUM CHLORIDE 0.9 % IV SOLN
INTRAVENOUS | Status: DC
  Administered 2019-05-19: 07:00:00 via INTRAVENOUS

## 2019-05-19 NOTE — Interval H&P Note (Signed)
History and Physical Interval Note:  05/19/2019 8:01 AM  Jon Oconnell  has presented today for surgery, with the diagnosis of ATRIAL FIBRILLATION.  The various methods of treatment have been discussed with the patient and family. After consideration of risks, benefits and other options for treatment, the patient has consented to  Procedure(s): TRANSESOPHAGEAL ECHOCARDIOGRAM (TEE) (N/A) CARDIOVERSION (N/A) as a surgical intervention.  The patient's history has been reviewed, patient examined, no change in status, stable for surgery.  I have reviewed the patient's chart and labs.  Questions were answered to the patient's satisfaction.     Carmeline Kowal [employer]

## 2019-05-19 NOTE — Transfer of Care (Signed)
Immediate Anesthesia Transfer of Care Note  Patient: Jon Oconnell  Procedure(s) Performed: TRANSESOPHAGEAL ECHOCARDIOGRAM (TEE) (N/A ) CARDIOVERSION (N/A )  Patient Location: Endoscopy Unit  Anesthesia Type:MAC  Level of Consciousness: drowsy and patient cooperative  Airway & Oxygen Therapy: Patient Spontanous Breathing and Patient connected to nasal cannula oxygen  Post-op Assessment: Report given to RN, Post -op Vital signs reviewed and stable and Patient moving all extremities X 4  Post vital signs: Reviewed and stable  Last Vitals:  Vitals Value Taken Time  BP 96/48 05/19/19 0826  Temp    Pulse 64 05/19/19 0828  Resp 14 05/19/19 0828  SpO2 98 % 05/19/19 0828  Vitals shown include unvalidated device data.  Last Pain:  Vitals:   05/19/19 0826  TempSrc:   PainSc: 0-No pain         Complications: No apparent anesthesia complications

## 2019-05-19 NOTE — Procedures (Signed)
Electrical Cardioversion Procedure Note Jon Oconnell [id number] December 26, 1950  Procedure: Electrical Cardioversion Indications:  Atrial Fibrillation  Procedure Details Consent: Risks of procedure as well as the alternatives and risks of each were explained to the (patient/caregiver).  Consent for procedure obtained. Time Out: Verified patient identification, verified procedure, site/side was marked, verified correct patient position, special equipment/implants available, medications/allergies/relevent history reviewed, required imaging and test results available.  Performed  Patient placed on cardiac monitor, pulse oximetry, supplemental oxygen as necessary.  Sedation given: Per anesthesiology Pacer pads placed anterior and posterior chest.  Cardioverted 1 time(s).  Cardioverted at [place].  Evaluation Findings: Post procedure EKG shows: NSR Complications: None Patient did tolerate procedure well.   Jon Oconnell 05/19/2019, 8:26 AM

## 2019-05-19 NOTE — Discharge Instructions (Signed)

## 2019-05-19 NOTE — Anesthesia Postprocedure Evaluation (Signed)
Anesthesia Post Note  Patient: Jon Oconnell  Procedure(s) Performed: TRANSESOPHAGEAL ECHOCARDIOGRAM (TEE) (N/A ) CARDIOVERSION (N/A )     Patient location during evaluation: Endoscopy Anesthesia Type: MAC Level of consciousness: awake and alert Pain management: pain level controlled Vital Signs Assessment: post-procedure vital signs reviewed and stable Respiratory status: spontaneous breathing, nonlabored ventilation and respiratory function stable Cardiovascular status: stable and blood pressure returned to baseline Postop Assessment: no apparent nausea or vomiting Anesthetic complications: no    Last Vitals:  Vitals:   05/19/19 0850 05/19/19 0900  BP: 94/60 103/61  Pulse: (!) 58 (!) 59  Resp: 18 15  Temp:    SpO2: 100% 98%    Last Pain:  Vitals:   05/19/19 0900  TempSrc:   PainSc: 0-No pain                 Lynda Rainwater

## 2019-05-19 NOTE — Progress Notes (Signed)
Echocardiogram Echocardiogram Transesophageal has been performed.  Jon Oconnell 05/19/2019, 8:30 AM

## 2019-05-19 NOTE — CV Procedure (Signed)
Procedure: TEE  Sedation: Per anesthesiology.   Indication: atrial fibrillation.   Findings: Please see echo section for full report.  Normal LV size with severe diffuse hypokinesis, EF 25-30%.  Normal wall thickness.  The RV was mildly dilated with mildly decreased systolic function.  Moderate left atrial enlargement, smoke in LA but no LA appendage thrombus.  Moderately dilated right atrium.  No evidence for PFO or ASD by color doppler.  Trivial tricuspid regurgitation, peak RV-RA gradient 24 mmHg.  Trivial mitral regurgitation.  Trileaflet aortic valve with no stenosis or regurgitation.  Normal caliber aorta with mild plaque in descending thoracic aorta.   Impression: May proceed to DCCV.   Jon Oconnell 05/19/2019 8:26 AM

## 2019-05-19 NOTE — Anesthesia Preprocedure Evaluation (Signed)
Anesthesia Evaluation  Patient identified by MRN, date of birth, ID band Patient awake    Reviewed: Allergy & Precautions, NPO status , Patient's Chart, lab work & pertinent test results  Airway Mallampati: II  TM Distance: >3 FB Neck ROM: Full    Dental no notable dental hx.    Pulmonary shortness of breath, former smoker,    Pulmonary exam normal breath sounds clear to auscultation       Cardiovascular +CHF  Normal cardiovascular exam+ dysrhythmias Atrial Fibrillation + Cardiac Defibrillator  Rhythm:Irregular Rate:Normal     Neuro/Psych Anxiety Depression negative neurological ROS  negative psych ROS   GI/Hepatic negative GI ROS, Neg liver ROS,   Endo/Other  negative endocrine ROS  Renal/GU negative Renal ROS  negative genitourinary   Musculoskeletal negative musculoskeletal ROS (+)   Abdominal   Peds negative pediatric ROS (+)  Hematology negative hematology ROS (+)   Anesthesia Other Findings   Reproductive/Obstetrics negative OB ROS                             Anesthesia Physical Anesthesia Plan  ASA: III  Anesthesia Plan: MAC   Post-op Pain Management:    Induction: Intravenous  PONV Risk Score and Plan: 1 and Treatment may vary due to age or medical condition and Ondansetron  Airway Management Planned: Nasal Cannula  Additional Equipment:   Intra-op Plan:   Post-operative Plan:   Informed Consent: I have reviewed the patients History and Physical, chart, labs and discussed the procedure including the risks, benefits and alternatives for the proposed anesthesia with the patient or authorized representative who has indicated his/her understanding and acceptance.     Dental advisory given  Plan Discussed with: CRNA  Anesthesia Plan Comments:         Anesthesia Quick Evaluation

## 2019-05-21 ENCOUNTER — Encounter (HOSPITAL_COMMUNITY): Payer: Self-pay | Admitting: Cardiology

## 2019-06-18 ENCOUNTER — Encounter (HOSPITAL_COMMUNITY): Payer: Self-pay | Admitting: Cardiology

## 2019-06-18 ENCOUNTER — Ambulatory Visit (HOSPITAL_COMMUNITY)
Admission: RE | Admit: 2019-06-18 | Discharge: 2019-06-18 | Disposition: A | Discharge status: Home / Self Care | Payer: No Typology Code available for payment source | Source: Ambulatory Visit | Attending: Cardiology | Admitting: Cardiology

## 2019-06-18 ENCOUNTER — Other Ambulatory Visit: Payer: Self-pay

## 2019-06-18 VITALS — BP 100/70 | HR 62 | Wt 182.2 lb

## 2019-06-18 DIAGNOSIS — G4733 Obstructive sleep apnea (adult) (pediatric): Secondary | ICD-10-CM | POA: Insufficient documentation

## 2019-06-18 DIAGNOSIS — I428 Other cardiomyopathies: Secondary | ICD-10-CM | POA: Diagnosis not present

## 2019-06-18 DIAGNOSIS — I4891 Unspecified atrial fibrillation: Secondary | ICD-10-CM

## 2019-06-18 DIAGNOSIS — I482 Chronic atrial fibrillation, unspecified: Secondary | ICD-10-CM | POA: Insufficient documentation

## 2019-06-18 DIAGNOSIS — N183 Chronic kidney disease, stage 3 (moderate): Secondary | ICD-10-CM | POA: Insufficient documentation

## 2019-06-18 DIAGNOSIS — Z79899 Other long term (current) drug therapy: Secondary | ICD-10-CM | POA: Insufficient documentation

## 2019-06-18 DIAGNOSIS — Z95810 Presence of automatic (implantable) cardiac defibrillator: Secondary | ICD-10-CM | POA: Insufficient documentation

## 2019-06-18 DIAGNOSIS — I5022 Chronic systolic (congestive) heart failure: Secondary | ICD-10-CM | POA: Diagnosis not present

## 2019-06-18 DIAGNOSIS — Z8572 Personal history of non-Hodgkin lymphomas: Secondary | ICD-10-CM | POA: Insufficient documentation

## 2019-06-18 DIAGNOSIS — Z87891 Personal history of nicotine dependence: Secondary | ICD-10-CM | POA: Insufficient documentation

## 2019-06-18 DIAGNOSIS — Z7901 Long term (current) use of anticoagulants: Secondary | ICD-10-CM | POA: Insufficient documentation

## 2019-06-18 LAB — CBC
HCT: 43.5 % (ref 39.0–52.0)
Hemoglobin: 14 g/dL (ref 13.0–17.0)
MCH: 28.3 pg (ref 26.0–34.0)
MCHC: 32.2 g/dL (ref 30.0–36.0)
MCV: 88.1 fL (ref 80.0–100.0)
Platelets: 168 10*3/uL (ref 150–400)
RBC: 4.94 MIL/uL (ref 4.22–5.81)
RDW: 14.4 % (ref 11.5–15.5)
WBC: 10.4 10*3/uL (ref 4.0–10.5)
nRBC: 0 % (ref 0.0–0.2)

## 2019-06-18 LAB — DIGOXIN LEVEL: Digoxin Level: 0.9 ng/mL (ref 0.8–2.0)

## 2019-06-18 LAB — COMPREHENSIVE METABOLIC PANEL
ALT: 38 U/L (ref 0–44)
AST: 27 U/L (ref 15–41)
Albumin: 4 g/dL (ref 3.5–5.0)
Alkaline Phosphatase: 71 U/L (ref 38–126)
Anion gap: 9 (ref 5–15)
BUN: 18 mg/dL (ref 8–23)
CO2: 23 mmol/L (ref 22–32)
Calcium: 9.5 mg/dL (ref 8.9–10.3)
Chloride: 103 mmol/L (ref 98–111)
Creatinine, Ser: 1.27 mg/dL — ABNORMAL HIGH (ref 0.61–1.24)
GFR calc Af Amer: >60 mL/min (ref >60)
GFR calc non Af Amer: 57 mL/min — ABNORMAL LOW (ref >60)
Glucose, Bld: 102 mg/dL — ABNORMAL HIGH (ref 70–99)
Potassium: 4.6 mmol/L (ref 3.5–5.1)
Sodium: 135 mmol/L (ref 135–145)
Total Bilirubin: 0.7 mg/dL (ref 0.3–1.2)
Total Protein: 7 g/dL (ref 6.5–8.1)

## 2019-06-18 LAB — TSH: TSH: 1.628 u[IU]/mL (ref 0.350–4.500)

## 2019-06-18 LAB — MAGNESIUM: Magnesium: 2.2 mg/dL (ref 1.7–2.4)

## 2019-06-18 MED ORDER — TORSEMIDE 20 MG PO TABS: 20.0000 mg | ORAL_TABLET | Freq: Every day | ORAL | 3 refills | Status: DC

## 2019-06-18 MED ORDER — LOSARTAN POTASSIUM 25 MG PO TABS: 25.0000 mg | ORAL_TABLET | Freq: Every day | ORAL | 3 refills | Status: DC

## 2019-06-18 NOTE — Patient Instructions (Addendum)
You have been referred to Electrophysiology to discuss treatment for your Atrial fibrillation.  That office will give you a call to schedule an appointment.  DECREASE Torsemide to 20mg  (1 tab) daily  INCREASE Losartan to 25mg  (1 tab) daily  Labs today We will only contact you if something comes back abnormal or we need to make some changes. Otherwise no news is good news!  Repeat labs in 10 days in [place]. A prescription has been given to you.   Your physician recommends that you schedule a follow-up appointment in: 2 months with Dr [name].    At the [facility], you and your health needs are our priority. As part of our continuing mission to provide you with exceptional heart care, we have created designated Provider Care Teams. These Care Teams include your primary Cardiologist (physician) and Advanced Practice Providers (APPs- Physician Assistants and Nurse Practitioners) who all work together to provide you with the care you need, when you need it.   You may see any of the following providers on your designated Care Team at your next follow up: Marland Kitchen Dr Glori Bickers . Dr [name] . Darrick Grinder, NP   Please be sure to bring in all your medications bottles to every appointment.

## 2019-06-20 NOTE — Progress Notes (Signed)
PCP: [facility] Cardiology: Dr. Stanford Breed HF Cardiology: Dr. [name]  68 y.o. with history of nonischemic cardiomyopathy, prior non-Hodgkins lymphoma, and atrial fibrillation returns for followup of CHF and atrial fibrillation.    Patient has a long-standing history of CHF.  In 2014, EF was 30-35%.  Cath at that time showed no obstructive CAD.  He has a Medtronic ICD. He recently moved to [place]. In 7/20, he was seen at the [facility] in [place] due to worsening dyspnea and palpitations.  He was found to be in atrial fibrillation with RVR.  He was started on Eliquis and TEE-guided DCCV was planned, but TEE showed LA thrombus so he did not have the cardioversion.  The TEE showed EF 25-30% with diffuse hypokinesis, normal RV, mild MR, moderate TR.  He was discharged home on Lasix 20 mg daily. Since then, he continued to be short of breath with exertion.    He was then admitted to [facility] on 04/20/19 with decompensated CHF.  He was diuresed.  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    He had TEE-guided DCCV to NSR in 8/20.  TEE showed EF 30-35% with no LA appendage thrombus.    He remains in NSR today, no palpitations.  He feels great, breathing is back to normal with no dyspnea.  No chest pain.  No lightheadedness/dizziness.  He is sleepy during the day but is not using his CPAP.  He continues on amiodarone 200 mg daily.   ECG (personally reviewed): NSR, 1st degree AVB, anterior Qs  Labs (8/20): K 4, creatinine 1.7 => 1.48  Past Medical History: 1. Non-Hodgkins lymphoma: Remote, in remission.  2. Atrial fibrillation: First noted in 7/20.  TEE in 7/20 showed left atrial appendage thrombus.  - DCCV to NSR in 8/20.  3. Chronic systolic CHF: Nonischemic cardiomyopathy.  Echo in 2014 with EF 30-35%, cath at that time showed no significant CAD.  - He  has a Medtronic ICD.  - TEE (7/20): EF 25-30% with diffuse hypokinesis, normal RV, moderate TR, mild MR.  There was a LA appendage thrombus.  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  Mean RA 3, PA 34/14, mean PCWP 10, CI 1.81.  - TEE (8/20): EF 30-35%, mildly decreased RV systolic function, no LA appendage thrombus.  4. CKD: Stage 3.  5. OSA: Not using CPAP.   Social History   Socioeconomic History  . Marital status: Married    Spouse name: Not on file  . Number of children: Not on file  . Years of education: Not on file  . Highest education level: Not on file  Occupational History  . Not on file  Social Needs  . Financial resource strain: Not on file  . Food insecurity    Worry: Not on file    Inability: Not on file  . Transportation needs    Medical: Not on file    Non-medical: Not on file  Tobacco Use  . Smoking status: Former Research scientist (life sciences)  . Smokeless tobacco: Never Used  Substance and Sexual Activity  . Alcohol use: Not Currently  . Drug use: Never  . Sexual activity: Not Currently  Lifestyle  . Physical activity    Days per week: Not on file    Minutes per session: Not on file  .  Stress: Not on file  Relationships  . Social Herbalist on phone: Not on file    Gets together: Not on file    Attends religious service: Not on file    Active member of club or organization: Not on file    Attends meetings of clubs or organizations: Not on file    Relationship status: Not on file  . Intimate partner violence    Fear of current or ex partner: Not on file    Emotionally abused: Not on file    Physically abused: Not on file    Forced sexual activity: Not on file  Other Topics Concern  . Not on file  Social History Narrative  . Not on file   Family History  Problem Relation Age of Onset  . Breast cancer Mother   . Stroke Father    ROS: All systems  reviewed and negative except as per HPI.   Current Outpatient Medications  Medication Sig Dispense Refill  . amiodarone (PACERONE) 200 MG tablet Take 400 mg (2 tablets) daily for 7 days, then decrease to 200 mg (1 tablet) daily. (Patient taking differently: Take 400 mg by mouth daily. ) 90 tablet 3  . apixaban (ELIQUIS) 5 MG TABS tablet Take 5 mg by mouth 2 (two) times daily.    . digoxin (LANOXIN) 0.125 MG tablet Take 0.125 mg by mouth daily.    Marland Kitchen eplerenone (INSPRA) 25 MG tablet Take 1 tablet (25 mg total) by mouth daily. 90 tablet 3  . ipratropium (ATROVENT) 0.02 % nebulizer solution Take 0.5 mg by nebulization every 6 (six) hours as needed for wheezing or shortness of breath.    . losartan (COZAAR) 25 MG tablet Take 1 tablet (25 mg total) by mouth at bedtime. 90 tablet 3  . metoprolol succinate (TOPROL-XL) 25 MG 24 hr tablet Take 1 tablet (25 mg total) by mouth 2 (two) times a day. Take with or immediately following a meal. 180 tablet 3  . omeprazole (PRILOSEC) 20 MG capsule Take 40 mg by mouth daily.    . sertraline (ZOLOFT) 50 MG tablet Take 50 mg by mouth daily.     . simvastatin (ZOCOR) 80 MG tablet Take 40 mg by mouth at bedtime.    . torsemide (DEMADEX) 20 MG tablet Take 1 tablet (20 mg total) by mouth daily. 90 tablet 3   No current facility-administered medications for this encounter.    BP 100/70   Pulse 62   Wt 82.6 kg (182 lb 3.2 oz)   SpO2 97%   BMI 26.91 kg/m  General: NAD Neck: No JVD, no thyromegaly or thyroid nodule.  Lungs: Clear to auscultation bilaterally with normal respiratory effort. CV: Nondisplaced PMI.  Heart irregular S1/S2, no S3/S4, no murmur.  No peripheral edema.  No carotid bruit.  Normal pedal pulses.  Abdomen: Soft, nontender, no hepatosplenomegaly, no distention.  Skin: Intact without lesions or rashes.  Neurologic: Alert and oriented x 3.  Psych: Normal affect. Extremities: No clubbing or cyanosis.  HEENT: Normal.   Assessment/Plan: 1.  Chronic systolic CHF: Patient has had a nonischemic cardiomyopathy since at least 2014, at that time echo showed EF 30-35%. 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 Medtronic ICD. 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 HIV negative 7/20. TEE in [place] in 7/20 showed EF 25-30% with diffuse hypokinesis, normal RV, mild MR, moderate TR. 7/20 CHF exacerbation appears to  have been triggered by new-onset atrial fibrillation. RHC in 8/20 showed normal filling pressures but low cardiac output.  He had TEE-guided DCCV in 8/20 (TEE showed EF 30-35%), and he remains in NSR today.  NYHA class I symptoms per his report.  Not volume overloaded on exam.   - Can decrease torsemide to 20 mg daily, BMET today.   - Continue digoxin, check digoxin level today.   - Continue Toprol XL 25 mg bid.  - Continue eplerenone 25 mg daily.  - Increase losartan to 25 mg daily, BP 100/70 today so will hold off on transition to Entresto (may get hypotensive).  BMET 10 days.  - QRS not wide, no indication for CRT upgrade.  2. Atrial fibrillation: He is back in NSR after DCCV in 8/20.  - Continue amiodarone 200 mg daily.  Check LFTs and TSH today, he will need a regular eye exam.  - I will refer him to EP for consideration of atrial fibrillation ablation.  Ideally, this would allow him to safely stop amiodarone.  - Continue Eliquis.  CBC today.   3. CKD stage 3: BMET today.  4. 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  It would be helpful to do a coronary CTA to assess the courses of the LAD/LCx (?malignant interarterial).  However, he is 89 and does not appear to have had an arrhythmia or chest pain related to anomalous coronaries so would be very unlikely to recommend CABG, etc. This can be done electively if creatinine improves.   [name] 06/20/2019

## 2019-06-21 ENCOUNTER — Encounter: Payer: No Typology Code available for payment source | Admitting: Cardiology

## 2019-06-28 NOTE — Progress Notes (Deleted)
HPI: Follow-up CHF and atrial fibrillation.  Patient was noted to have an ejection fraction of 30 to 35% in 2014.  Cardiac catheterization at that time showed no obstructive coronary artery disease. It was felt his cardiomyopathy may be related to chemotherapy used to treat non-Hodgkin's lymphoma.  Also with history of ICD. Peviously admitted to [facility] with CHF symptoms and atrial fibrillation. It was noted at that time that he had had a recent TEE at [facility] that showed left atrial appendage thrombus; ejection fraction 25 to 30%.  Cardiac catheterization July 2020 showed no significant coronary disease; all coronaries arise from the right cusp.  Pulmonary capillary wedge pressure 10.  Echocardiogram August 2020 showed ejection fraction 25 to 30%, mild left atrial enlargement and moderate tricuspid regurgitation.  Patient had repeat transesophageal echocardiogram August 2020 which showed ejection fraction 30 to 35%, mildly dilated right ventricle with reduced function, no left atrial appendage thrombus, moderate right atrial enlargement.  Had cardioversion at that time.  Since last seen,   Current Outpatient Medications  Medication Sig Dispense Refill  . amiodarone (PACERONE) 200 MG tablet Take 400 mg (2 tablets) daily for 7 days, then decrease to 200 mg (1 tablet) daily. (Patient taking differently: Take 400 mg by mouth daily. ) 90 tablet 3  . apixaban (ELIQUIS) 5 MG TABS tablet Take 5 mg by mouth 2 (two) times daily.    . digoxin (LANOXIN) 0.125 MG tablet Take 0.125 mg by mouth daily.    Marland Kitchen eplerenone (INSPRA) 25 MG tablet Take 1 tablet (25 mg total) by mouth daily. 90 tablet 3  . ipratropium (ATROVENT) 0.02 % nebulizer solution Take 0.5 mg by nebulization every 6 (six) hours as needed for wheezing or shortness of breath.    . losartan (COZAAR) 25 MG tablet Take 1 tablet (25 mg total) by mouth at bedtime. 90 tablet 3  . metoprolol succinate (TOPROL-XL) 25 MG 24 hr tablet Take 1  tablet (25 mg total) by mouth 2 (two) times a day. Take with or immediately following a meal. 180 tablet 3  . omeprazole (PRILOSEC) 20 MG capsule Take 40 mg by mouth daily.    . sertraline (ZOLOFT) 50 MG tablet Take 50 mg by mouth daily.     . simvastatin (ZOCOR) 80 MG tablet Take 40 mg by mouth at bedtime.    . torsemide (DEMADEX) 20 MG tablet Take 1 tablet (20 mg total) by mouth daily. 90 tablet 3   No current facility-administered medications for this visit.      Past Medical History:  Diagnosis Date  . AICD (automatic cardioverter/defibrillator) present   . Atrial fibrillation with RVR ([place]) 04/08/2019  . Cardiac defibrillator in place 04/08/2019  . Chronic congestive heart failure ([place]) 04/08/2019  . Depression with anxiety 04/08/2019  . Dilated cardiomyopathy ([place]) 04/09/2019  . Dyspnea    with exertion  . Dysrhythmia    A-fib  . Hyperlipidemia 04/08/2019  . 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 NICM (nonischemic cardiomyopathy) ([place])   . Non-ischemic cardiomyopathy ([place]) 03/03/2017  . 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 CARDIOVERSION N/A 05/19/2019   Procedure: CARDIOVERSION;  Surgeon: Larey Dresser, MD;  Location: [facility] ENDOSCOPY;  Service: Cardiovascular;  Laterality: N/A;  . ICD IMPLANT Left   . RIGHT/LEFT HEART CATH AND CORONARY ANGIOGRAPHY N/A 04/23/2019   Procedure: RIGHT/LEFT HEART CATH AND CORONARY ANGIOGRAPHY;  Surgeon: [name]  S, MD;  Location: [facility] CV LAB;  Service: Cardiovascular;  Laterality: N/A;  . TEE WITHOUT CARDIOVERSION N/A 05/19/2019   Procedure: TRANSESOPHAGEAL ECHOCARDIOGRAM (TEE);  Surgeon: Larey Dresser, MD;  Location: [facility] ENDOSCOPY;  Service: Cardiovascular;  Laterality: N/A;    Social History   Socioeconomic History  . Marital status: Married    Spouse name: Not on file  . Number of children: Not on file  . Years of education: Not on file  . Highest  education level: Not on file  Occupational History  . Not on file  Social Needs  . Financial resource strain: Not on file  . Food insecurity    Worry: Not on file    Inability: Not on file  . Transportation needs    Medical: Not on file    Non-medical: Not on file  Tobacco Use  . Smoking status: Former Research scientist (life sciences)  . Smokeless tobacco: Never Used  Substance and Sexual Activity  . Alcohol use: Not Currently  . Drug use: Never  . Sexual activity: Not Currently  Lifestyle  . Physical activity    Days per week: Not on file    Minutes per session: Not on file  . Stress: Not on file  Relationships  . Social Herbalist on phone: Not on file    Gets together: Not on file    Attends religious service: Not on file    Active member of club or organization: Not on file    Attends meetings of clubs or organizations: Not on file    Relationship status: Not on file  . Intimate partner violence    Fear of current or ex partner: Not on file    Emotionally abused: Not on file    Physically abused: Not on file    Forced sexual activity: Not on file  Other Topics Concern  . Not on file  Social History Narrative  . Not on file    Family History  Problem Relation Age of Onset  . Breast cancer Mother   . Stroke Father     ROS: no fevers or chills, productive cough, hemoptysis, dysphasia, odynophagia, melena, hematochezia, dysuria, hematuria, rash, seizure activity, orthopnea, PND, pedal edema, claudication. Remaining systems are negative.  Physical Exam: Well-developed well-nourished in no acute distress.  Skin is warm and dry.  HEENT is normal.  Neck is supple.  Chest is clear to auscultation with normal expansion.  Cardiovascular exam is regular rate and rhythm.  Abdominal exam nontender or distended. No masses palpated. Extremities show no edema. neuro grossly intact  ECG- personally reviewed  A/P  1 nonischemic cardiomyopathy-this is felt possibly secondary to prior  chemotherapy.  No obstructive disease noted on most recent cardiac catheterization.  Continue beta-blocker and ARB.  2 chronic systolic congestive heart failure-he appears to be euvolemic today.  Continue present dose of demadex and eplerenone.  3 paroxysmal atrial fibrillation-patient is in sinus rhythm status post cardioversion.  Continue amiodarone at present dose.  Continue beta-blocker, digoxin and apixaban.  4 prior ICD-followed by Dr. Curt Bears.  5 chronic stage III kidney disease-we will need close follow-up of renal function.  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  Would not pursue further evaluation at this time.  Kirk Ruths, MD

## 2019-07-07 ENCOUNTER — Ambulatory Visit: Payer: Self-pay | Admitting: Cardiology

## 2019-08-02 ENCOUNTER — Encounter: Payer: Self-pay | Admitting: Cardiology

## 2019-08-02 ENCOUNTER — Other Ambulatory Visit: Payer: Self-pay

## 2019-08-02 ENCOUNTER — Ambulatory Visit (INDEPENDENT_AMBULATORY_CARE_PROVIDER_SITE_OTHER): Payer: No Typology Code available for payment source | Admitting: Cardiology

## 2019-08-02 VITALS — BP 92/60 | HR 63 | Ht 69.0 in | Wt 184.0 lb

## 2019-08-02 DIAGNOSIS — I4819 Other persistent atrial fibrillation: Secondary | ICD-10-CM

## 2019-08-02 NOTE — Progress Notes (Signed)
Electrophysiology Office Note   Date:  08/02/2019   ID:  Jon Oconnell, DOB [birth year], MRN [id number]  PCP:  [facility]  Cardiologist:  Marica Otter Primary Electrophysiologist:  Shaundra Fullam Meredith Leeds, MD    Chief Complaint: CHF   History of Present Illness: Jon Oconnell is a 68 y.o. male who is being seen today for the evaluation of CHF at the request of Kirk Ruths. Presenting today for electrophysiology evaluation.  He has a history significant for nonischemic cardiomyopathy, prior Hodgkin's lymphoma, and atrial fibrillation.  In 2014 he had a left heart catheterization that showed no obstructive coronary artery disease.  He has a Medtronic ICD.  He went to the hospital in [place] and was found to have atrial fibrillation with rapid rates.  He was started on Eliquis and had a planned TEE and cardioversion, but a left atrial thrombus was found.  He was admitted to [facility] 04/20/2019 with decompensated heart failure and was diuresed.  He underwent TEE guided cardioversion to sinus rhythm on 05/13/2019.  TEE at the time showed an EF of 30 to 35% with no left atrial thrombus.  Today, he denies symptoms of palpitations, chest pain, shortness of breath, orthopnea, PND, lower extremity edema, claudication, dizziness, presyncope, syncope, bleeding, or neurologic sequela. The patient is tolerating medications without difficulties.    Past Medical History:  Diagnosis Date  . AICD (automatic cardioverter/defibrillator) present   . Atrial fibrillation with RVR ([place]) 04/08/2019  . Cardiac defibrillator in place 04/08/2019  . Chronic congestive heart failure ([place]) 04/08/2019  . Depression with anxiety 04/08/2019  . Dilated cardiomyopathy ([place]) 04/09/2019  . Dyspnea    with exertion  . Dysrhythmia    A-fib  . Hyperlipidemia 04/08/2019  . 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 NICM (nonischemic cardiomyopathy) ([place])   . Non-ischemic cardiomyopathy ([place]) 03/03/2017  . Thrombus of left  atrial appendage without antecedent myocardial infarction    Past Surgical History:  Procedure Laterality Date  . CARDIOVERSION N/A 05/19/2019   Procedure: CARDIOVERSION;  Surgeon: Larey Dresser, MD;  Location: [facility] ENDOSCOPY;  Service: Cardiovascular;  Laterality: N/A;  . ICD IMPLANT Left   . RIGHT/LEFT HEART CATH AND CORONARY ANGIOGRAPHY N/A 04/23/2019   Procedure: RIGHT/LEFT HEART CATH AND CORONARY ANGIOGRAPHY;  Surgeon: Larey Dresser, MD;  Location: [place] CV LAB;  Service: Cardiovascular;  Laterality: N/A;  . TEE WITHOUT CARDIOVERSION N/A 05/19/2019   Procedure: TRANSESOPHAGEAL ECHOCARDIOGRAM (TEE);  Surgeon: Larey Dresser, MD;  Location: [facility] ENDOSCOPY;  Service: Cardiovascular;  Laterality: N/A;     Current Outpatient Medications  Medication Sig Dispense Refill  . amiodarone (PACERONE) 200 MG tablet Take 400 mg (2 tablets) daily for 7 days, then decrease to 200 mg (1 tablet) daily. (Patient taking differently: Take 400 mg by mouth daily. ) 90 tablet 3  . apixaban (ELIQUIS) 5 MG TABS tablet Take 5 mg by mouth 2 (two) times daily.    . digoxin (LANOXIN) 0.125 MG tablet Take 0.125 mg by mouth daily.    Jon Oconnell Kitchen eplerenone (INSPRA) 25 MG tablet Take 1 tablet (25 mg total) by mouth daily. 90 tablet 3  . ipratropium (ATROVENT) 0.02 % nebulizer solution Take 0.5 mg by nebulization every 6 (six) hours as needed for wheezing or shortness of breath.    . losartan (COZAAR) 25 MG tablet Take 1 tablet (25 mg total) by mouth at bedtime. 90 tablet 3  . metoprolol succinate (TOPROL-XL) 25 MG 24 hr tablet Take 1 tablet (25 mg total) by  mouth 2 (two) times a day. Take with or immediately following a meal. 180 tablet 3  . omeprazole (PRILOSEC) 20 MG capsule Take 40 mg by mouth daily.    . sertraline (ZOLOFT) 50 MG tablet Take 50 mg by mouth daily.     . simvastatin (ZOCOR) 80 MG tablet Take 40 mg by mouth at bedtime.    . torsemide (DEMADEX) 20 MG tablet Take 1 tablet (20 mg total) by mouth daily. 90  tablet 3   No current facility-administered medications for this visit.     Allergies:   Patient has no known allergies.   Social History:  The patient  reports that he has quit smoking. He has never used smokeless tobacco. He reports previous alcohol use. He reports that he does not use drugs.   Family History:  The patient's family history includes Breast cancer in his mother; Stroke in his father.    ROS:  Please see the history of present illness.   Otherwise, review of systems is positive for none.   All other systems are reviewed and negative.    PHYSICAL EXAM: VS:  BP 92/60   Pulse 63   Ht 5\' 9"  (1.753 m)   Wt 184 lb (83.5 kg)   SpO2 97%   BMI 27.17 kg/m  , BMI Body mass index is 27.17 kg/m. GEN: Well nourished, well developed, in no acute distress  HEENT: normal  Neck: no JVD, carotid bruits, or masses Cardiac: RRR; no murmurs, rubs, or gallops,no edema  Respiratory:  clear to auscultation bilaterally, normal work of breathing GI: soft, nontender, nondistended, + BS MS: no deformity or atrophy  Skin: warm and dry, device pocket is well healed Neuro:  Strength and sensation are intact Psych: euthymic mood, full affect  EKG:  EKG is not ordered today. Personal review of the ekg ordered 06/18/19 shows sinus rhythm, septal Q waves, first-degree AV block  Device interrogation is reviewed today in detail.  See PaceArt for details.   Recent Labs: 04/20/2019: B Natriuretic Peptide 2,049.6 06/18/2019: ALT 38; BUN 18; Creatinine, Ser 1.27; Hemoglobin 14.0; Magnesium 2.2; Platelets 168; Potassium 4.6; Sodium 135; TSH 1.628    Lipid Panel  No results found for: CHOL, TRIG, HDL, CHOLHDL, VLDL, LDLCALC, LDLDIRECT   Wt Readings from Last 3 Encounters:  08/02/19 184 lb (83.5 kg)  06/18/19 182 lb 3.2 oz (82.6 kg)  05/19/19 161 lb 9.2 oz (73.3 kg)      Other studies Reviewed: Additional studies/ records that were reviewed today include: TEE 05/23/2019 Review of the above  records today demonstrates:   1. The left ventricle has moderate-severely reduced systolic function, with an ejection fraction of 30-35%. The cavity size was normal. Left ventricular diffuse hypokinesis.  2. The right ventricle has mildly reduced systolic function. The cavity was mildly enlarged.  3. Moderate left atrial enlargement, smoke in LA but no LA appendage thrombus.  4. No evidence of mitral valve stenosis. Trivial mitral regurgitation.  5. Trivial tricuspid regurgitation, peak RV-RA gradient 24 mmHg.  6. Normal caliber aorta with mild plaque in descending thoracic aorta.  7. Moderately dilated right atrium. No evidence for PFO or ASD by color doppler.   ASSESSMENT AND PLAN:  1.  Systolic heart failure due to nonischemic cardiomyopathy: Currently on optimal medical therapy with losartan, eplerenone, and Toprol-XL.  Status post Medtronic ICD.  Device functioning appropriately.  No changes at this time.  2.  Persistent atrial fibrillation: Currently on amiodarone and Eliquis.  I did  speak with him about switching from amiodarone and going with ablation.  Risks and benefits discussed include bleeding, tamponade, heart block, stroke, damage surrounding organs.  We Sharise Lippy discuss this with his wife and give [place] an answer later this week.  This patients CHA2DS2-VASc Score and unadjusted Ischemic Stroke Rate (% per year) is equal to 2.2 % stroke rate/year from a score of 2  Above score calculated as 1 point each if present [CHF, HTN, DM, Vascular=MI/PAD/Aortic Plaque, Age if 65-74, or Male] Above score calculated as 2 points each if present [Age > 75, or Stroke/TIA/TE]  Case discussed with referring cardiology  Current medicines are reviewed at length with the patient today.   The patient does not have concerns regarding his medicines.  The following changes were made today:  none  Labs/ tests ordered today include:  No orders of the defined types were placed in this encounter.     Disposition:   FU with Josejulian Tarango 6 months  Signed, Dazhane Villagomez Meredith Leeds, MD  08/02/2019 3:20 PM     [facility] [address] [place] [address] [phone] (office) [phone] (fax)

## 2019-08-02 NOTE — Patient Instructions (Addendum)
Medication Instructions:  Your physician recommends that you continue on your current medications as directed. Please refer to the Current Medication list given to you today.  *If you need a refill on your cardiac medications before your next appointment, please call your pharmacy*  Labwork: None ordered  Testing/Procedures: None ordered  Follow-Up: Remote monitoring is used to monitor your Pacemaker or ICD from home. This monitoring reduces the number of office visits required to check your device to one time per year. It allows [place] to keep an eye on the functioning of your device to ensure it is working properly. You are scheduled for a device check from home on 11/01/2019. You may send your transmission at any time that day. If you have a wireless device, the transmission will be sent automatically. After your physician reviews your transmission, you will receive a postcard with your next transmission date.  Your physician wants you to follow-up in: 1 year with Dr. Curt Bears. You will receive a reminder letter in the mail two months in advance. If you don't receive a letter, please call our office to schedule the follow-up appointment.   Thank you for choosing [facility]!!   Trinidad Curet, RN [phone]  Any Other Special Instructions Will Be Listed Below (If Applicable) Cardiac Ablation  Cardiac ablation is a procedure to stop some heart tissue from causing problems. The heart has many electrical connections. Sometimes these connections make the heart beat very fast or irregularly. Removing some problem areas can improve the heart rhythm or make it normal. What happens before the procedure?  Follow instructions from your doctor about what you cannot eat or drink.  Ask your doctor about: ? Changing or stopping your normal medicines. This is important if you take diabetes medicines or blood thinners. ? Taking medicines such as aspirin and ibuprofen. These medicines can thin your  blood. Do not take these medicines before your procedure if your doctor tells you not to.  Plan to have someone take you home.  If you will be going home right after the procedure, plan to have someone with you for 24 hours. What happens during the procedure?  To lower your risk of infection: ? Your health care team will wash or sanitize their hands. ? Your skin will be washed with soap. ? Hair may be removed from your neck or groin.  An IV tube will be put into one of your veins.  You will be given a medicine to help you relax (sedative).  Skin on your neck or groin will be numbed.  A cut (incision) will be made in your neck or groin.  A needle will be put through your cut and into a vein in your neck or groin.  A tube (catheter) will be put into the needle. The tube will be moved to your heart. X-rays (fluoroscopy) will be used to help guide the tube.  Small devices (electrodes) on the tip of the tube will send out electrical currents.  Dye may be put through the tube. This helps your surgeon see your heart.  Electrical energy will be used to scar (ablate) some heart tissue. Your surgeon may use: ? Heat (radiofrequency energy). ? Laser energy. ? Extreme cold (cryoablation).  The tube will be taken out.  Pressure will be held on your cut. This helps stop bleeding.  A bandage (dressing) will be put on your cut. The procedure may vary. What happens after the procedure?  You will be monitored until your medicines  have worn off.  Your cut will be watched for bleeding. You will need to lie still for a few hours.  Do not drive for 24 hours or as long as your doctor tells you. Summary  Cardiac ablation is a procedure to stop some heart tissue from causing problems.  Electrical energy will be used to scar (ablate) some heart tissue. This information is not intended to replace advice given to you by your health care provider. Make sure you discuss any questions you have  with your health care provider. Document Released: 05/12/2013 Document Revised: 08/22/2017 Document Reviewed: 07/29/2016 Elsevier Patient Education  [place] Your cardiac CT will be scheduled at one of the below locations:   [facility] [address] [place], [place] [address] [phone]  OR   Please go to [facility] at ________, please arrive at the [facility] main entrance of [facility] 30-45 minutes prior to test start time. Proceed to the [facility] Radiology Department (first floor) to check-in and test prep.  Please follow these instructions carefully (unless otherwise directed):  Hold all erectile dysfunction medications at least 3 days (72 hrs) prior to test.  On the Night Before the Test: . Be sure to Drink plenty of water. . Do not consume any caffeinated/decaffeinated beverages or chocolate 12 hours prior to your test. . Do not take any antihistamines 12 hours prior to your test. . If the patient has contrast allergy: ? Patient will need a prescription for Prednisone and very clear instructions (as follows): 1. Prednisone 50 mg - take 13 hours prior to test 2. Take another Prednisone 50 mg 7 hours prior to test 3. Take another Prednisone 50 mg 1 hour prior to test 4. Take Benadryl 50 mg 1 hour prior to test . Patient must complete all four doses of above prophylactic medications. . Patient will need a ride after test due to Benadryl.  On the Day of the Test: . Drink plenty of water. Do not drink any water within one hour of the test. . Do not eat any food 4 hours prior to the test. . You may take your regular medications prior to the test.  . Take metoprolol (Lopressor) two hours prior to test. . HOLD Furosemide/Hydrochlorothiazide morning of the test. . FEMALES- please wear underwire-free bra if available      After the Test: . Drink plenty of water. . After receiving IV contrast, you may  experience a mild flushed feeling. This is normal. . On occasion, you may experience a mild rash up to 24 hours after the test. This is not dangerous. If this occurs, you can take Benadryl 25 mg and increase your fluid intake. . If you experience trouble breathing, this can be serious. If it is severe call 911 IMMEDIATELY. If it is mild, please call our office. . If you take any of these medications: Glipizide/Metformin, Avandament, Glucavance, please do not take 48 hours after completing test unless otherwise instructed.   Once we have confirmed authorization from your insurance company, we will call you to set up a date and time for your test.   For non-scheduling related questions, please contact the cardiac imaging nurse navigator should you have any questions/concerns: Marchia Bond, RN Navigator Cardiac Imaging [facility] [phone] Office      Electrophysiology/Ablation Procedure Instructions   You are scheduled for a(n)  ablation on _________ with Dr. Allegra Lai.  1.   Pre procedure testing-             A.  LAB WORK --- On _________  for your pre procedure blood work.                 B. COVID TEST-- On ________ @ _________ Dennis Bast will go to [facility] hospital ([place]) for your Covid testing.   This is a drive thru test site.  There will be multiple testing areas.  Be sure to share with the first checkpoint that you are there for pre-procedure/surgery testing. This will put you into the right (yellow) lane that leads to the PAT testing team. Stay in your car and the nurse team will come to your car to test you.  After you are tested please go home and self quarantine until the day of your procedure.     2. On the day of your procedure _________ you will go to [facility] hospital [phone] [employer]) at ___________.  You will go to the main entrance [address] [employer]) and enter where the [employer] parking staff are.  Your driver will drop  you off and you will head down the hallway to ADMITTING.  You may have one support person come in to the hospital with you.  They will be asked to wait in the waiting room.   3.   Do not eat or drink after midnight prior to your procedure.   4.   Do NOT take any medications the morning of your procedure.   5.  Plan for an overnight stay.  If you use your phone frequently bring your phone charger.   6. You will follow up with the AFIB clinic 4 weeks after your procedure.  You will follow up with Dr. Curt Bears  3 months after your procedure.  These appointments will be made for you.   * If you have ANY questions please call the office [phone] and ask for Audrey Eller RN or send me a MyChart message   * Occasionally, EP Studies and ablations can become lengthy.  Please make your family aware of this before your procedure starts.  Average time ranges from 2-8 hours for EP studies/ablations.  Your physician will call your family after the procedure with the results.

## 2019-08-10 ENCOUNTER — Telehealth: Payer: Self-pay | Admitting: Cardiology

## 2019-08-10 DIAGNOSIS — I4819 Other persistent atrial fibrillation: Secondary | ICD-10-CM

## 2019-08-10 NOTE — Telephone Encounter (Signed)
Patient is calling in regards to scheduling his ablation.

## 2019-08-12 NOTE — Telephone Encounter (Signed)
Pt would like to go for ablation on 12/22 Aware I will call in next several weeks to review instructions

## 2019-08-12 NOTE — Telephone Encounter (Signed)
Attempted to reach pt several times.  Could not hear him on the other end of the line and then phone would hang up. Will attempt to reach pt again at later time

## 2019-08-23 ENCOUNTER — Ambulatory Visit (HOSPITAL_COMMUNITY)
Admission: RE | Admit: 2019-08-23 | Discharge: 2019-08-23 | Disposition: A | Discharge status: Home / Self Care | Payer: No Typology Code available for payment source | Source: Ambulatory Visit | Attending: Cardiology | Admitting: Cardiology

## 2019-08-23 ENCOUNTER — Other Ambulatory Visit: Payer: Self-pay

## 2019-08-23 ENCOUNTER — Encounter (HOSPITAL_COMMUNITY): Payer: Self-pay | Admitting: Cardiology

## 2019-08-23 VITALS — BP 84/52 | HR 88 | Wt 185.2 lb

## 2019-08-23 DIAGNOSIS — R9431 Abnormal electrocardiogram [ECG] [EKG]: Secondary | ICD-10-CM | POA: Diagnosis not present

## 2019-08-23 DIAGNOSIS — I5022 Chronic systolic (congestive) heart failure: Secondary | ICD-10-CM | POA: Diagnosis not present

## 2019-08-23 DIAGNOSIS — Z95810 Presence of automatic (implantable) cardiac defibrillator: Secondary | ICD-10-CM | POA: Diagnosis not present

## 2019-08-23 DIAGNOSIS — N183 Chronic kidney disease, stage 3 unspecified: Secondary | ICD-10-CM | POA: Insufficient documentation

## 2019-08-23 DIAGNOSIS — I428 Other cardiomyopathies: Secondary | ICD-10-CM | POA: Diagnosis not present

## 2019-08-23 DIAGNOSIS — I4891 Unspecified atrial fibrillation: Secondary | ICD-10-CM | POA: Diagnosis not present

## 2019-08-23 DIAGNOSIS — G4733 Obstructive sleep apnea (adult) (pediatric): Secondary | ICD-10-CM | POA: Diagnosis not present

## 2019-08-23 DIAGNOSIS — Z87891 Personal history of nicotine dependence: Secondary | ICD-10-CM | POA: Diagnosis not present

## 2019-08-23 DIAGNOSIS — Z8572 Personal history of non-Hodgkin lymphomas: Secondary | ICD-10-CM | POA: Diagnosis not present

## 2019-08-23 DIAGNOSIS — Z7901 Long term (current) use of anticoagulants: Secondary | ICD-10-CM | POA: Insufficient documentation

## 2019-08-23 DIAGNOSIS — Z79899 Other long term (current) drug therapy: Secondary | ICD-10-CM | POA: Insufficient documentation

## 2019-08-23 LAB — COMPREHENSIVE METABOLIC PANEL
ALT: 26 U/L (ref 0–44)
AST: 22 U/L (ref 15–41)
Albumin: 4 g/dL (ref 3.5–5.0)
Alkaline Phosphatase: 75 U/L (ref 38–126)
Anion gap: 13 (ref 5–15)
BUN: 20 mg/dL (ref 8–23)
CO2: 24 mmol/L (ref 22–32)
Calcium: 9.7 mg/dL (ref 8.9–10.3)
Chloride: 101 mmol/L (ref 98–111)
Creatinine, Ser: 1.68 mg/dL — ABNORMAL HIGH (ref 0.61–1.24)
GFR calc Af Amer: 47 mL/min — ABNORMAL LOW (ref >60)
GFR calc non Af Amer: 41 mL/min — ABNORMAL LOW (ref >60)
Glucose, Bld: 162 mg/dL — ABNORMAL HIGH (ref 70–99)
Potassium: 3.7 mmol/L (ref 3.5–5.1)
Sodium: 138 mmol/L (ref 135–145)
Total Bilirubin: 0.9 mg/dL (ref 0.3–1.2)
Total Protein: 7.1 g/dL (ref 6.5–8.1)

## 2019-08-23 LAB — TSH: TSH: 1.359 u[IU]/mL (ref 0.350–4.500)

## 2019-08-23 LAB — DIGOXIN LEVEL: Digoxin Level: 0.4 ng/mL — ABNORMAL LOW (ref 0.8–2.0)

## 2019-08-23 MED ORDER — LOSARTAN POTASSIUM 25 MG PO TABS: 12.5000 mg | ORAL_TABLET | Freq: Every day | ORAL | 3 refills | Status: DC

## 2019-08-23 MED ORDER — TORSEMIDE 20 MG PO TABS: 20.0000 mg | ORAL_TABLET | ORAL | 3 refills | Status: DC

## 2019-08-23 MED ORDER — TORSEMIDE 20 MG PO TABS: 20.0000 mg | ORAL_TABLET | Freq: Every day | ORAL | 3 refills | Status: DC

## 2019-08-23 MED ORDER — MECLIZINE HCL 25 MG PO TABS: 25.0000 mg | ORAL_TABLET | Freq: Two times a day (BID) | ORAL | 2 refills | Status: DC | PRN

## 2019-08-23 MED ORDER — AMIODARONE HCL 200 MG PO TABS: 200.0000 mg | ORAL_TABLET | Freq: Every day | ORAL | 3 refills | Status: DC

## 2019-08-23 NOTE — Patient Instructions (Signed)
Make sure your Amiodarone is only 200 mg Daily  Decrease Losartan to 12.5 mg (1/2 tab) every night  Start Meclizine 25 mg Twice daily AS NEEDED FOR VERTIGO  Continue on Torsemide 20 mg DAILY  Labs done today, we will notify you of abnormal results  Your physician recommends that you schedule a follow-up appointment in: 2 months with echocardiogram  If you have any questions or concerns before your next appointment please send [place] a message through mychart or call our office at [phone].  At the [facility], you and your health needs are our priority. As part of our continuing mission to provide you with exceptional heart care, we have created designated Provider Care Teams. These Care Teams include your primary Cardiologist (physician) and Advanced Practice Providers (APPs- Physician Assistants and Nurse Practitioners) who all work together to provide you with the care you need, when you need it.   You may see any of the following providers on your designated Care Team at your next follow up: Marland Kitchen Dr Glori Bickers . Dr [name] . Darrick Grinder, NP . Lyda Jester, PA   Please be sure to bring in all your medications bottles to every appointment.

## 2019-08-24 NOTE — Progress Notes (Signed)
PCP: [facility] Cardiology: Dr. Stanford Breed HF Cardiology: Dr. [name]  68 y.o. with history of nonischemic cardiomyopathy, prior non-Hodgkins lymphoma, and atrial fibrillation returns for followup of CHF and atrial fibrillation.    Patient has a long-standing history of CHF.  In 2014, EF was 30-35%.  Cath at that time showed no obstructive CAD.  He has a Medtronic ICD. He recently moved to [place]. In 7/20, he was seen at the [facility] in [place] due to worsening dyspnea and palpitations.  He was found to be in atrial fibrillation with RVR.  He was started on Eliquis and TEE-guided DCCV was planned, but TEE showed LA thrombus so he did not have the cardioversion.  The TEE showed EF 25-30% with diffuse hypokinesis, normal RV, mild MR, moderate TR.  He was discharged home on Lasix 20 mg daily. Since then, he continued to be short of breath with exertion.    He was then admitted to [facility] on 04/20/19 with decompensated CHF.  He was diuresed.  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    He had TEE-guided DCCV to NSR in 8/20.  TEE showed EF 30-35% with no LA appendage thrombus.    He remains in NSR today, no palpitations.  Two weeks ago, he had an episode of syncope with rapid standing and was hospitalized at the [place] in [place].  He says that due to a misunderstanding, he was taking both Lasix and torsemide.  It sounds like he was dehydrated and orthostatic.  Currently, he occasionally gets lightheaded with standing but it is not severe. BP is low today, he says that SBP generally runs in the 90s-100s range when he checks at home.  No significant exertional dyspnea.  No chest pain.  No orthopnea/PND.  He also recently has been having episodes of a "spinning sensation."  He notes this when his head is in a certain position such as when he lies down in bed.   This is different from his orthostatic lightheadedness.  Finally, he has daytime sleepiness and has not been using CPAP.   ECG (personally reviewed): NSR, LAFB, septal Qs.   Medtronic device interrogation: fluid index recently > threshold, now just below.  Thoracic impedance has generally trended down recently. No VT.   Labs (8/20): K 4, creatinine 1.7 => 1.48 Labs (9/20): K 4.6, creatinine 1.27, LFTs normal, TSH normal, digoxin level 0.9, hgb 14  Past Medical History: 1. Non-Hodgkins lymphoma: Remote, in remission.  2. Atrial fibrillation: First noted in 7/20.  TEE in 7/20 showed left atrial appendage thrombus.  - DCCV to NSR in 8/20.  3. Chronic systolic CHF: Nonischemic cardiomyopathy.  Echo in 2014 with EF 30-35%, cath at that time showed no significant CAD.  - He has a Medtronic ICD.  - TEE (7/20): EF 25-30% with diffuse hypokinesis, normal RV, moderate TR, mild MR.  There was a LA appendage thrombus.  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  Mean RA 3, PA 34/14, mean PCWP 10, CI 1.81.  - TEE (8/20): EF 30-35%, mildly decreased RV systolic function, no LA appendage thrombus.  4. CKD: Stage 3.  5. OSA: Not using CPAP.  6. Syncope (11/20): Suspect dehydrated, taking both furosemide and torsemide.   Social History   Socioeconomic History  . Marital status:  Married    Spouse name: Not on file  . Number of children: Not on file  . Years of education: Not on file  . Highest education level: Not on file  Occupational History  . Not on file  Social Needs  . Financial resource strain: Not on file  . Food insecurity    Worry: Not on file    Inability: Not on file  . Transportation needs    Medical: Not on file    Non-medical: Not on file  Tobacco Use  . Smoking status: Former Research scientist (life sciences)  . Smokeless tobacco: Never Used  Substance and Sexual Activity  . Alcohol use: Not  Currently  . Drug use: Never  . Sexual activity: Not Currently  Lifestyle  . Physical activity    Days per week: Not on file    Minutes per session: Not on file  . Stress: Not on file  Relationships  . Social Herbalist on phone: Not on file    Gets together: Not on file    Attends religious service: Not on file    Active member of club or organization: Not on file    Attends meetings of clubs or organizations: Not on file    Relationship status: Not on file  . Intimate partner violence    Fear of current or ex partner: Not on file    Emotionally abused: Not on file    Physically abused: Not on file    Forced sexual activity: Not on file  Other Topics Concern  . Not on file  Social History Narrative  . Not on file   Family History  Problem Relation Age of Onset  . Breast cancer Mother   . Stroke Father    ROS: All systems reviewed and negative except as per HPI.   Current Outpatient Medications  Medication Sig Dispense Refill  . amiodarone (PACERONE) 200 MG tablet Take 1 tablet (200 mg total) by mouth daily. 90 tablet 3  . apixaban (ELIQUIS) 5 MG TABS tablet Take 5 mg by mouth 2 (two) times daily.    . digoxin (LANOXIN) 0.125 MG tablet Take 0.125 mg by mouth daily.    Marland Kitchen eplerenone (INSPRA) 25 MG tablet Take 1 tablet (25 mg total) by mouth daily. 90 tablet 3  . ipratropium (ATROVENT) 0.02 % nebulizer solution Take 0.5 mg by nebulization every 6 (six) hours as needed for wheezing or shortness of breath.    . losartan (COZAAR) 25 MG tablet Take 0.5 tablets (12.5 mg total) by mouth at bedtime. 45 tablet 3  . metoprolol succinate (TOPROL-XL) 25 MG 24 hr tablet Take 1 tablet (25 mg total) by mouth 2 (two) times a day. Take with or immediately following a meal. 180 tablet 3  . omeprazole (PRILOSEC) 20 MG capsule Take 40 mg by mouth daily.    . sertraline (ZOLOFT) 50 MG tablet Take 50 mg by mouth daily.     . simvastatin (ZOCOR) 80 MG tablet Take 40 mg by mouth at  bedtime.    . torsemide (DEMADEX) 20 MG tablet Take 1 tablet (20 mg total) by mouth daily. 90 tablet 3  . meclizine (ANTIVERT) 25 MG tablet Take 1 tablet (25 mg total) by mouth 2 (two) times daily as needed for dizziness. 60 tablet 2   No current facility-administered medications for this encounter.    BP (!) 84/52   Pulse 88   Wt 84 kg (185 lb 3.2 oz)   SpO2 95%  BMI 27.35 kg/m  General: NAD Neck: No JVD, no thyromegaly or thyroid nodule.  Lungs: Clear to auscultation bilaterally with normal respiratory effort. CV: Nondisplaced PMI.  Heart regular S1/S2, no S3/S4, no murmur.  No peripheral edema.  No carotid bruit.  Normal pedal pulses.  Abdomen: Soft, nontender, no hepatosplenomegaly, no distention.  Skin: Intact without lesions or rashes.  Neurologic: Alert and oriented x 3.  Psych: Normal affect. Extremities: No clubbing or cyanosis.  HEENT: Normal.   Assessment/Plan: 1. Chronic systolic CHF: Patient has had a nonischemic cardiomyopathy since at least 2014, at that time echo showed EF 30-35%. 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 Medtronic ICD. 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 HIV negative 7/20. TEE in [place] in 7/20 showed EF 25-30% with diffuse hypokinesis, normal RV, mild MR, moderate TR. 7/20 CHF exacerbation appears to have been triggered by new-onset atrial fibrillation. RHC in 8/20 showed normal filling pressures but low cardiac output.  He had TEE-guided DCCV in 8/20 (TEE showed EF 30-35%), and he remains in NSR today.  NYHA class I-II symptoms per his report.  Not volume overloaded on exam but Optivol assessment is suggestive of volume overload.   - Continue torsemide 20 mg daily.  BMET today.    - Continue digoxin, check digoxin level today.   - Continue Toprol XL 25 mg bid.  - Continue eplerenone 25 mg daily.  - With soft BP and occasional  orthostatic-type symptoms, decrease losartan to 12.5 mg daily.  .  - QRS not wide, no indication for CRT upgrade.  - Repeat echo in 2 months while hopefully in NSR.  2. Atrial fibrillation: He is back in NSR after DCCV in 8/20.  - Continue amiodarone 200 mg daily.  Check LFTs and TSH today, he will need a regular eye exam.  - Atrial fibrillation ablation planned for 12/20.  Hopefully, he will be able to stop amiodarone eventually after ablation.   - Continue Eliquis.   3. CKD stage 3: BMET today.  4. 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  It would be helpful to do a coronary CTA to assess the courses of the LAD/LCx (?malignant interarterial).  However, he is 47 and does not appear to have had an arrhythmia or chest pain related to anomalous coronaries so would be very unlikely to recommend CABG, etc. This can be done electively if creatinine remains stable.  5. Vertigo: Patient has symptoms consistent with positional vertigo.  I will give him prn meclizine (has had vertigo in the past, meclizine helped).  If he does not improve, needs followup with PCP.   Followup in 2 months with echo.   [name] 08/24/2019

## 2019-09-06 ENCOUNTER — Telehealth: Payer: Self-pay | Admitting: *Deleted

## 2019-09-06 NOTE — Telephone Encounter (Signed)
Reviewed CT & ablation instructions. Covid screening scheduled for 12/21. Pt office will call to arrange post procedure follow up. Patient verbalized understanding and agreeable to plan.

## 2019-09-08 ENCOUNTER — Ambulatory Visit (HOSPITAL_COMMUNITY): Payer: Self-pay | Attending: Cardiology

## 2019-09-08 ENCOUNTER — Other Ambulatory Visit: Payer: Self-pay

## 2019-09-08 DIAGNOSIS — I5022 Chronic systolic (congestive) heart failure: Secondary | ICD-10-CM | POA: Insufficient documentation

## 2019-09-09 ENCOUNTER — Telehealth (INDEPENDENT_AMBULATORY_CARE_PROVIDER_SITE_OTHER): Payer: No Typology Code available for payment source | Admitting: Cardiology

## 2019-09-09 ENCOUNTER — Telehealth (HOSPITAL_COMMUNITY): Payer: Self-pay | Admitting: Emergency Medicine

## 2019-09-09 DIAGNOSIS — I428 Other cardiomyopathies: Secondary | ICD-10-CM

## 2019-09-09 DIAGNOSIS — Z7901 Long term (current) use of anticoagulants: Secondary | ICD-10-CM

## 2019-09-09 DIAGNOSIS — Z95810 Presence of automatic (implantable) cardiac defibrillator: Secondary | ICD-10-CM | POA: Diagnosis not present

## 2019-09-09 DIAGNOSIS — I4819 Other persistent atrial fibrillation: Secondary | ICD-10-CM | POA: Diagnosis not present

## 2019-09-09 NOTE — Progress Notes (Signed)
Electrophysiology TeleHealth Note   Due to national recommendations of social distancing due to COVID 19, an audio/video telehealth visit is felt to be most appropriate for this patient at this time.  See Epic message for the patient's consent to telehealth for [facility].   Date:  09/09/2019   ID:  Jon Oconnell, DOB 1951/08/28, MRN [id number]  Location: patient's home  Provider location: [address] [place]  Evaluation Performed: Follow-up visit  PCP:  [facility] [place]  Cardiologist:  Mertie Moores, MD  Electrophysiologist:  Dr Curt Bears  Chief Complaint:  ICD  History of Present Illness:    Jon Oconnell is a 68 y.o. male who presents via audio/video conferencing for a telehealth visit today.  Since last being seen in our clinic, the patient reports doing very well.  Today, he denies symptoms of palpitations, chest pain, shortness of breath,  lower extremity edema, dizziness, presyncope, or syncope.  The patient is otherwise without complaint today.  The patient denies symptoms of fevers, chills, cough, or new SOB worrisome for COVID 19.  He has a history of nonischemic cardiomyopathy, non-Hodgkin's lymphoma, and atrial fibrillation.  He is status post Medtronic ICD.  Today, denies symptoms of palpitations, chest pain, shortness of breath, orthopnea, PND, lower extremity edema, claudication, dizziness, presyncope, syncope, bleeding, or neurologic sequela. The patient is tolerating medications without difficulties.  He is planned for AF ablation 09/16/2019.  He remains in sinus rhythm without complaint today.  Past Medical History:  Diagnosis Date  . AICD (automatic cardioverter/defibrillator) present   . Atrial fibrillation with RVR ([place]) 04/08/2019  . Cardiac defibrillator in place 04/08/2019  . Chronic congestive heart failure ([place]) 04/08/2019  . Depression with anxiety 04/08/2019  . Dilated cardiomyopathy ([place]) 04/09/2019  . Dyspnea    with  exertion  . Dysrhythmia    A-fib  . Hyperlipidemia 04/08/2019  . 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 NICM (nonischemic cardiomyopathy) ([place])   . Non-ischemic cardiomyopathy ([place]) 03/03/2017  . 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 CARDIOVERSION N/A 05/19/2019   Procedure: CARDIOVERSION;  Surgeon: Larey Dresser, MD;  Location: [facility] ENDOSCOPY;  Service: Cardiovascular;  Laterality: N/A;  . ICD IMPLANT Left   . RIGHT/LEFT HEART CATH AND CORONARY ANGIOGRAPHY N/A 04/23/2019   Procedure: RIGHT/LEFT HEART CATH AND CORONARY ANGIOGRAPHY;  Surgeon: Larey Dresser, MD;  Location: [place] CV LAB;  Service: Cardiovascular;  Laterality: N/A;  . TEE WITHOUT CARDIOVERSION N/A 05/19/2019   Procedure: TRANSESOPHAGEAL ECHOCARDIOGRAM (TEE);  Surgeon: Larey Dresser, MD;  Location: [facility] ENDOSCOPY;  Service: Cardiovascular;  Laterality: N/A;    Current Outpatient Medications  Medication Sig Dispense Refill  . amiodarone (PACERONE) 200 MG tablet Take 1 tablet (200 mg total) by mouth daily. 90 tablet 3  . apixaban (ELIQUIS) 5 MG TABS tablet Take 5 mg by mouth 2 (two) times daily.    . digoxin (LANOXIN) 0.125 MG tablet Take 0.125 mg by mouth daily.    Marland Kitchen eplerenone (INSPRA) 25 MG tablet Take 1 tablet (25 mg total) by mouth daily. 90 tablet 3  . ipratropium (ATROVENT) 0.02 % nebulizer solution Take 0.5 mg by nebulization every 6 (six) hours as needed for wheezing or shortness of breath.    . losartan (COZAAR) 25 MG tablet Take 0.5 tablets (12.5 mg total) by mouth at bedtime. 45 tablet 3  . meclizine (ANTIVERT) 25 MG tablet Take 1 tablet (  25 mg total) by mouth 2 (two) times daily as needed for dizziness. 60 tablet 2  . metoprolol succinate (TOPROL-XL) 25 MG 24 hr tablet Take 1 tablet (25 mg total) by mouth 2 (two) times a day. Take with or immediately following a meal. 180 tablet 3  . omeprazole (PRILOSEC) 20 MG capsule Take 40  mg by mouth daily.    . sertraline (ZOLOFT) 50 MG tablet Take 50 mg by mouth daily.     . simvastatin (ZOCOR) 80 MG tablet Take 40 mg by mouth at bedtime.    . torsemide (DEMADEX) 20 MG tablet Take 1 tablet (20 mg total) by mouth daily. 90 tablet 3   No current facility-administered medications for this visit.    Allergies:   Patient has no known allergies.   Social History:  The patient  reports that he has quit smoking. He has never used smokeless tobacco. He reports previous alcohol use. He reports that he does not use drugs.   Family History:  The patient's  family history includes Breast cancer in his mother; Stroke in his father.   ROS:  Please see the history of present illness.   All other systems are personally reviewed and negative.    Exam:    Vital Signs:  BP 112/67   no acute distress, no shortness of breath.  Labs/Other Tests and Data Reviewed:    Recent Labs: 04/20/2019: B Natriuretic Peptide 2,049.6 06/18/2019: Hemoglobin 14.0; Magnesium 2.2; Platelets 168 08/23/2019: ALT 26; BUN 20; Creatinine, Ser 1.68; Potassium 3.7; Sodium 138; TSH 1.359   Wt Readings from Last 3 Encounters:  08/23/19 185 lb 3.2 oz (84 kg)  08/02/19 184 lb (83.5 kg)  06/18/19 182 lb 3.2 oz (82.6 kg)     Other studies personally reviewed: Additional studies/ records that were reviewed today include: ECG 08/23/19 personally reviewed  Review of the above records today demonstrates:   Sinus rhythm, septal Q waves, left axis deviation  ASSESSMENT & PLAN:    1.  Persistent atrial fibrillation: Currently on amiodarone and Eliquis.  We Jiah Bari plan for ablation 09/16/2019.  Risks and benefits discussed include bleeding, tamponade, heart block, stroke, damage surrounding organs.  He understands these risks and is agreed to the procedure.  CHA2DS2-VASc of 2.  2.  Nonischemic cardiomyopathy: Currently on optimal medical therapy.  Status post Medtronic ICD.  Device functioning appropriately.  No  changes.   COVID 19 screen The patient denies symptoms of COVID 19 at this time.  The importance of social distancing was discussed today.  Follow-up:  3 months   Current medicines are reviewed at length with the patient today.   The patient does not have concerns regarding his medicines.  The following changes were made today:  none  Labs/ tests ordered today include:  No orders of the defined types were placed in this encounter.    Patient Risk:  after full review of this patients clinical status, I feel that they are at moderate risk at this time.  Today, I have spent 5 minutes with the patient with telehealth technology discussing AF .    Signed, Virgilia Quigg Meredith Leeds, MD  09/09/2019 3:16 PM     [address] [place] [address] [phone] (office) [phone] (fax)

## 2019-09-09 NOTE — Telephone Encounter (Signed)
Reaching out to patient to offer assistance regarding upcoming cardiac imaging study; pt verbalizes understanding of appt date/time, parking situation and where to check in, pre-test NPO status and medications ordered, and verified current allergies; name and call back number provided for further questions should they arise Marchia Bond RN [place] [facility] [phone] office [phone] cell   Clarified location of procedure with patient that he is to arrive at [place] DEPT 30 mins prior to appt

## 2019-09-10 ENCOUNTER — Ambulatory Visit (HOSPITAL_COMMUNITY)
Admission: RE | Admit: 2019-09-10 | Discharge: 2019-09-10 | Disposition: A | Discharge status: Home / Self Care | Payer: Self-pay | Source: Ambulatory Visit | Attending: Cardiology | Admitting: Cardiology

## 2019-09-10 ENCOUNTER — Other Ambulatory Visit: Payer: Self-pay

## 2019-09-10 DIAGNOSIS — I4819 Other persistent atrial fibrillation: Secondary | ICD-10-CM

## 2019-09-10 IMAGING — CT CT HEART MORPH/PULM VEIN W/ CM & W/O CA SCORE
1 of 19 series · 1 of 20 positions shown, 2 images · IV contrast (Omni 300)
Comparison: Chest radiograph [DATE]
COMPARISON: Chest radiograph [DATE]

Addendum:
EXAM:
OVER-READ INTERPRETATION  CT CHEST

The following report is an over-read performed by radiologist Dr.
over-read does not include interpretation of cardiac or coronary
anatomy or pathology. The coronary CTA interpretation by the
cardiologist is attached.
CLINICAL DATA: Atrial fibrillation scheduled for an ablation.
Cardiac CT/CTA
TECHNIQUE: The patient was scanned on a Siemens Somatom scanner.

[Series 6: thins · axial · 0.35mm/px · z∈[+1161,+1161]mm · 1 of 138 slices shown, 2 images]
[im 1/138  vessel]
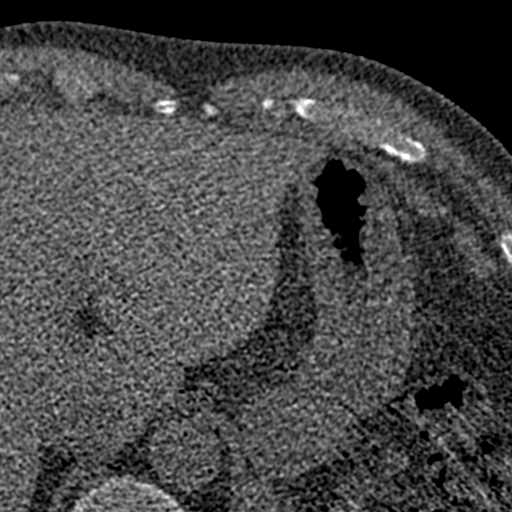
[im 1/138  lung]
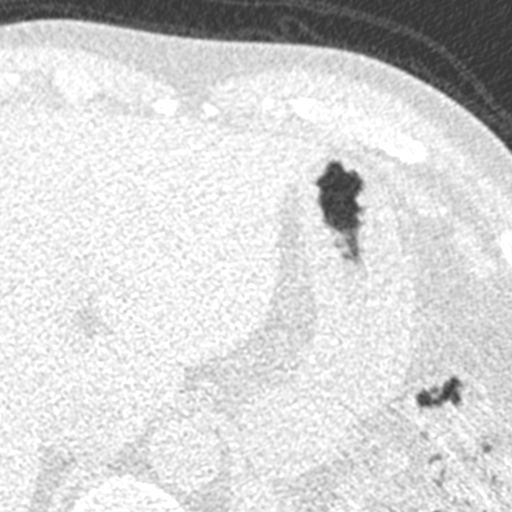

[1 of 20 positions shown; findings below may reference images not displayed]

FINDINGS: Vascular: Aortic atherosclerosis. No dissection. No central
pulmonary embolism, on this non-dedicated study.

Mediastinum/Nodes: No imaged thoracic adenopathy.

Lungs/Pleura: No pleural fluid.  Lingular scarring or atelectasis.

Upper Abdomen: Normal imaged portions of the liver, spleen.

Musculoskeletal: Mild right-sided gynecomastia. No acute osseous
abnormality.
IMPRESSION: 1.  No acute findings in the imaged extracardiac chest.
2.  Aortic Atherosclerosis ([33]-[33]).
3. Mild right-sided gynecomastia.
FINDINGS: A 120 kV prospective scan was triggered in the descending thoracic
aorta at 111 HU's. Gantry rotation speed was 280 msecs and
collimation was .9 mm. No beta blockade and no NTG was given. The 3D
data set was reconstructed in 5% intervals of the 60-80 % of the R-R
cycle. Diastolic phases were analyzed on a dedicated work station
using MPR, MIP and VRT modes. The patient received 80 cc of
contrast.

There is normal pulmonary vein drainage into the left atrium (3 on
the right and 2 on the left). Right middle lobe pulmonary vein
enters the left atrium through a separate antrum.

Ostial measurements as follows:

RUPV: 24.6 x 18.7 mm, widely patent

RMLPV: 8.76 x 6.74 mm, widely patent

RLPV: 18.3 x 17.0 mm, widely patent

LUPV: 19.2 x 11.3 mm, widely patent

LLPV: 15.1 x 12.1 mm, widely patent

No anomalous pulmonary venous drainage.

Normal left atrial appendage, no left atrial appendage thrombus. No
intracardiac mass or thrombus.

The esophagus runs adjacent to the left inferior pulmonary vein.

Aorta: Normal caliber, 31 mm at the mid ascending aorta measured
double oblique. No dissection or calcifications.

Aortic Valve: No calcifications.

Coronary Arteries: Anomalous coronary artery origins. Apparent
separate ostia vs. very short common origin. The RCA originates off
the right coronary cusp and has a normal course. The LAD is a dual
artery. There is a normal caliber LAD that originates off the right
coronary cusp with an anterior course around the pulmonary artery.
There is a smaller caliber LAD with a high takeoff of the left
coronary cusp. The left circumflex artery originates off the right
coronary cusp and takes a retroaortic course. No slit like origins.

Right dominance.

The study was performed without use of NTG and insufficient for
plaque evaluation.

Pulmonary artery: Normal caliber main pulmonary artery.

Moderate left atrial chamber enlargement.

Defibrillator lead noted in SVC, RA and RV.
IMPRESSION: 1. There is normal pulmonary vein drainage into the left atrium. No
pulmonary vein stenosis.

2. Normal left atrial appendage, no left atrial appendage thrombus.
No intracardiac mass or thrombus.

3. The esophagus runs adjacent to the left inferior pulmonary vein.

4. Anomalous coronary artery origins. Apparent separate ostia vs.
very short common origin. The RCA originates off the right coronary
cusp and has a normal course. The LAD is a dual artery. There is a
normal caliber LAD that originates off the right coronary cusp with
an anterior course around the pulmonary artery. There is a smaller
caliber LAD with a high takeoff of the left coronary cusp. The left
circumflex artery originates off the right coronary cusp and takes a
retroaortic course. No slit like origins. Coronary angiogram images
[DATE] reviewed for comparison. See representative images in
series 998.

*** End of Addendum ***
EXAM:
OVER-READ INTERPRETATION  CT CHEST

The following report is an over-read performed by radiologist Dr.
over-read does not include interpretation of cardiac or coronary
anatomy or pathology. The coronary CTA interpretation by the
cardiologist is attached.
FINDINGS: Vascular: Aortic atherosclerosis. No dissection. No central
pulmonary embolism, on this non-dedicated study.

Mediastinum/Nodes: No imaged thoracic adenopathy.

Lungs/Pleura: No pleural fluid.  Lingular scarring or atelectasis.

Upper Abdomen: Normal imaged portions of the liver, spleen.

Musculoskeletal: Mild right-sided gynecomastia. No acute osseous
abnormality.
IMPRESSION: 1.  No acute findings in the imaged extracardiac chest.
2.  Aortic Atherosclerosis ([33]-[33]).
3. Mild right-sided gynecomastia.

## 2019-09-10 MED ORDER — IOHEXOL 350 MG/ML SOLN
100.0000 mL | Freq: Once | INTRAVENOUS | Status: AC | PRN
  Administered 2019-09-10: 100 mL via INTRAVENOUS

## 2019-09-13 ENCOUNTER — Telehealth: Payer: Self-pay | Admitting: Cardiology

## 2019-09-13 ENCOUNTER — Other Ambulatory Visit (HOSPITAL_COMMUNITY)
Admission: RE | Admit: 2019-09-13 | Discharge: 2019-09-13 | Disposition: A | Discharge status: Home / Self Care | Payer: HRSA Program | Source: Ambulatory Visit | Attending: Cardiology | Admitting: Cardiology

## 2019-09-13 DIAGNOSIS — Z20828 Contact with and (suspected) exposure to other viral communicable diseases: Secondary | ICD-10-CM | POA: Insufficient documentation

## 2019-09-13 LAB — SARS CORONAVIRUS 2 (TAT 6-24 HRS): SARS Coronavirus 2: NEGATIVE

## 2019-09-13 NOTE — Telephone Encounter (Signed)
Charity from [facility] is calling in regards to pre-authorization for his procedure on 09/16/19 with Dr. Curt Bears.

## 2019-09-15 ENCOUNTER — Other Ambulatory Visit: Payer: Self-pay

## 2019-09-16 ENCOUNTER — Other Ambulatory Visit: Payer: Self-pay

## 2019-09-16 ENCOUNTER — Ambulatory Visit (HOSPITAL_COMMUNITY)
Admission: RE | Admit: 2019-09-16 | Discharge: 2019-09-16 | Disposition: A | Discharge status: Home / Self Care | Payer: Self-pay | Attending: Cardiology | Admitting: Cardiology

## 2019-09-16 ENCOUNTER — Encounter (HOSPITAL_COMMUNITY)
Admission: RE | Disposition: A | Discharge status: Home / Self Care | Payer: Self-pay | Source: Home / Self Care | Attending: Cardiology

## 2019-09-16 ENCOUNTER — Ambulatory Visit (HOSPITAL_COMMUNITY): Payer: Self-pay | Admitting: Anesthesiology

## 2019-09-16 DIAGNOSIS — F329 Major depressive disorder, single episode, unspecified: Secondary | ICD-10-CM | POA: Insufficient documentation

## 2019-09-16 DIAGNOSIS — Z79899 Other long term (current) drug therapy: Secondary | ICD-10-CM | POA: Insufficient documentation

## 2019-09-16 DIAGNOSIS — I4819 Other persistent atrial fibrillation: Secondary | ICD-10-CM | POA: Insufficient documentation

## 2019-09-16 DIAGNOSIS — Z7901 Long term (current) use of anticoagulants: Secondary | ICD-10-CM | POA: Insufficient documentation

## 2019-09-16 DIAGNOSIS — F419 Anxiety disorder, unspecified: Secondary | ICD-10-CM | POA: Insufficient documentation

## 2019-09-16 DIAGNOSIS — Z87891 Personal history of nicotine dependence: Secondary | ICD-10-CM | POA: Insufficient documentation

## 2019-09-16 DIAGNOSIS — I428 Other cardiomyopathies: Secondary | ICD-10-CM | POA: Insufficient documentation

## 2019-09-16 DIAGNOSIS — Z95810 Presence of automatic (implantable) cardiac defibrillator: Secondary | ICD-10-CM | POA: Insufficient documentation

## 2019-09-16 DIAGNOSIS — E785 Hyperlipidemia, unspecified: Secondary | ICD-10-CM | POA: Insufficient documentation

## 2019-09-16 DIAGNOSIS — I509 Heart failure, unspecified: Secondary | ICD-10-CM | POA: Insufficient documentation

## 2019-09-16 HISTORY — PX: ATRIAL FIBRILLATION ABLATION: EP1191

## 2019-09-16 LAB — CBC
HCT: 40.7 % (ref 39.0–52.0)
Hemoglobin: 13 g/dL (ref 13.0–17.0)
MCH: 29 pg (ref 26.0–34.0)
MCHC: 31.9 g/dL (ref 30.0–36.0)
MCV: 90.6 fL (ref 80.0–100.0)
Platelets: 180 10*3/uL (ref 150–400)
RBC: 4.49 MIL/uL (ref 4.22–5.81)
RDW: 14.1 % (ref 11.5–15.5)
WBC: 10.9 10*3/uL — ABNORMAL HIGH (ref 4.0–10.5)
nRBC: 0 % (ref 0.0–0.2)

## 2019-09-16 LAB — BASIC METABOLIC PANEL
Anion gap: 13 (ref 5–15)
BUN: 18 mg/dL (ref 8–23)
CO2: 28 mmol/L (ref 22–32)
Calcium: 9.5 mg/dL (ref 8.9–10.3)
Chloride: 100 mmol/L (ref 98–111)
Creatinine, Ser: 1.64 mg/dL — ABNORMAL HIGH (ref 0.61–1.24)
GFR calc Af Amer: 49 mL/min — ABNORMAL LOW (ref >60)
GFR calc non Af Amer: 42 mL/min — ABNORMAL LOW (ref >60)
Glucose, Bld: 106 mg/dL — ABNORMAL HIGH (ref 70–99)
Potassium: 3.9 mmol/L (ref 3.5–5.1)
Sodium: 141 mmol/L (ref 135–145)

## 2019-09-16 LAB — POCT ACTIVATED CLOTTING TIME
Activated Clotting Time: 329 seconds
Activated Clotting Time: 323 seconds
Activated Clotting Time: 345 seconds

## 2019-09-16 SURGERY — ATRIAL FIBRILLATION ABLATION
Anesthesia: General

## 2019-09-16 MED ORDER — DOBUTAMINE IN D5W 4-5 MG/ML-% IV SOLN
INTRAVENOUS | Status: DC | PRN
  Administered 2019-09-16: 20 ug/kg/min via INTRAVENOUS

## 2019-09-16 MED ORDER — HEPARIN SODIUM (PORCINE) 1000 UNIT/ML IJ SOLN
INTRAMUSCULAR | Status: DC | PRN
  Administered 2019-09-16: 14000 [IU] via INTRAVENOUS
  Administered 2019-09-16: 2000 [IU] via INTRAVENOUS
  Administered 2019-09-16: 1000 [IU] via INTRAVENOUS
  Administered 2019-09-16: 2000 [IU] via INTRAVENOUS

## 2019-09-16 MED ORDER — DOBUTAMINE IN D5W 4-5 MG/ML-% IV SOLN
INTRAVENOUS | Status: AC
  Filled 2019-09-16: qty 250

## 2019-09-16 MED ORDER — FENTANYL CITRATE (PF) 250 MCG/5ML IJ SOLN
INTRAMUSCULAR | Status: DC | PRN
  Administered 2019-09-16: 100 ug via INTRAVENOUS

## 2019-09-16 MED ORDER — HEPARIN SODIUM (PORCINE) 1000 UNIT/ML IJ SOLN
INTRAMUSCULAR | Status: AC
  Filled 2019-09-16: qty 1

## 2019-09-16 MED ORDER — HEPARIN (PORCINE) IN NACL 1000-0.9 UT/500ML-% IV SOLN
INTRAVENOUS | Status: AC
  Filled 2019-09-16: qty 500

## 2019-09-16 MED ORDER — SODIUM CHLORIDE 0.9% FLUSH: 3.0000 mL | Freq: Two times a day (BID) | INTRAVENOUS | Status: DC

## 2019-09-16 MED ORDER — HEPARIN (PORCINE) IN NACL 1000-0.9 UT/500ML-% IV SOLN
INTRAVENOUS | Status: DC | PRN
  Administered 2019-09-16 (×5): 500 mL

## 2019-09-16 MED ORDER — SODIUM CHLORIDE 0.9 % IV SOLN: 250.0000 mL | INTRAVENOUS | Status: DC | PRN

## 2019-09-16 MED ORDER — PROTAMINE SULFATE 10 MG/ML IV SOLN
INTRAVENOUS | Status: DC | PRN
  Administered 2019-09-16 (×5): 10 mg via INTRAVENOUS

## 2019-09-16 MED ORDER — HEPARIN SODIUM (PORCINE) 1000 UNIT/ML IJ SOLN
INTRAMUSCULAR | Status: DC | PRN
  Administered 2019-09-16: 1000 [IU] via INTRAVENOUS

## 2019-09-16 MED ORDER — ONDANSETRON HCL 4 MG/2ML IJ SOLN: 4.0000 mg | Freq: Four times a day (QID) | INTRAMUSCULAR | Status: DC | PRN

## 2019-09-16 MED ORDER — ACETAMINOPHEN 325 MG PO TABS
650.0000 mg | ORAL_TABLET | ORAL | Status: DC | PRN
  Filled 2019-09-16: qty 2

## 2019-09-16 MED ORDER — SUGAMMADEX SODIUM 200 MG/2ML IV SOLN
INTRAVENOUS | Status: DC | PRN
  Administered 2019-09-16: 170 mg via INTRAVENOUS

## 2019-09-16 MED ORDER — MIDAZOLAM HCL 2 MG/2ML IJ SOLN
INTRAMUSCULAR | Status: DC | PRN
  Administered 2019-09-16: 2 mg via INTRAVENOUS

## 2019-09-16 MED ORDER — BUPIVACAINE HCL (PF) 0.25 % IJ SOLN
INTRAMUSCULAR | Status: AC
  Filled 2019-09-16: qty 60

## 2019-09-16 MED ORDER — PROPOFOL 10 MG/ML IV BOLUS
INTRAVENOUS | Status: DC | PRN
  Administered 2019-09-16: 100 mg via INTRAVENOUS

## 2019-09-16 MED ORDER — SODIUM CHLORIDE 0.9 % IV SOLN: INTRAVENOUS | Status: DC

## 2019-09-16 MED ORDER — ROCURONIUM BROMIDE 10 MG/ML (PF) SYRINGE
PREFILLED_SYRINGE | INTRAVENOUS | Status: DC | PRN
  Administered 2019-09-16: 100 mg via INTRAVENOUS

## 2019-09-16 MED ORDER — SODIUM CHLORIDE 0.9% FLUSH: 3.0000 mL | INTRAVENOUS | Status: DC | PRN

## 2019-09-16 MED ORDER — ONDANSETRON HCL 4 MG/2ML IJ SOLN
INTRAMUSCULAR | Status: DC | PRN
  Administered 2019-09-16: 4 mg via INTRAVENOUS

## 2019-09-16 MED ORDER — SUCCINYLCHOLINE CHLORIDE 200 MG/10ML IV SOSY
PREFILLED_SYRINGE | INTRAVENOUS | Status: DC | PRN
  Administered 2019-09-16: 140 mg via INTRAVENOUS

## 2019-09-16 MED ORDER — LIDOCAINE 2% (20 MG/ML) 5 ML SYRINGE
INTRAMUSCULAR | Status: DC | PRN
  Administered 2019-09-16: 100 mg via INTRAVENOUS

## 2019-09-16 MED ORDER — BUPIVACAINE HCL (PF) 0.25 % IJ SOLN
INTRAMUSCULAR | Status: DC | PRN
  Administered 2019-09-16: 60 mL

## 2019-09-16 MED ORDER — PHENYLEPHRINE HCL-NACL 10-0.9 MG/250ML-% IV SOLN
INTRAVENOUS | Status: DC | PRN
  Administered 2019-09-16: 10 ug/min via INTRAVENOUS

## 2019-09-16 SURGICAL SUPPLY — 24 items
BAG SNAP BAND KOVER 36X36 (MISCELLANEOUS) ×2 IMPLANT
BLANKET WARM UNDERBOD FULL ACC (MISCELLANEOUS) ×2 IMPLANT
CABLE ADAPT PACING TEMP 12FT (ADAPTER) ×2 IMPLANT
CATH MAPPNG PENTARAY F 2-6-2MM (CATHETERS) ×1 IMPLANT
CATH SMTCH THERMOCOOL SF DF (CATHETERS) ×2 IMPLANT
CATH SOUNDSTAR ECO REPROCESSED (CATHETERS) ×2 IMPLANT
CATH WEBSTER BI DIR CS D-F CRV (CATHETERS) ×2 IMPLANT
COVER SWIFTLINK CONNECTOR (BAG) ×2 IMPLANT
DEVICE CLOSURE PERCLS PRGLD 6F (VASCULAR PRODUCTS) ×4 IMPLANT
PACK EP LATEX FREE (CUSTOM PROCEDURE TRAY) ×1
PACK EP LF (CUSTOM PROCEDURE TRAY) ×1 IMPLANT
PAD PRO RADIOLUCENT 2001M-C (PAD) ×2 IMPLANT
PATCH CARTO3 (PAD) ×2 IMPLANT
PENTARAY F 2-6-2MM (CATHETERS) ×2
PERCLOSE PROGLIDE 6F (VASCULAR PRODUCTS) ×8
SHEATH AVANTI 11F 11CM (SHEATH) ×2 IMPLANT
SHEATH BAYLIS SUREFLEX  M 8.5 (SHEATH) ×1
SHEATH BAYLIS SUREFLEX M 8.5 (SHEATH) ×1 IMPLANT
SHEATH BAYLIS TRANSSEPTAL 98CM (NEEDLE) ×4 IMPLANT
SHEATH CARTO VIZIGO SM CVD (SHEATH) ×2 IMPLANT
SHEATH PINNACLE 7F 10CM (SHEATH) ×2 IMPLANT
SHEATH PINNACLE 8F 10CM (SHEATH) ×4 IMPLANT
SHEATH PROBE COVER 6X72 (BAG) ×2 IMPLANT
TUBING SMART ABLATE COOLFLOW (TUBING) ×2 IMPLANT

## 2019-09-16 NOTE — Anesthesia Postprocedure Evaluation (Signed)
Anesthesia Post Note  Patient: Jon Oconnell  Procedure(s) Performed: ATRIAL FIBRILLATION ABLATION (N/A )     Patient location during evaluation: PACU Anesthesia Type: General Level of consciousness: awake and alert Pain management: pain level controlled Vital Signs Assessment: post-procedure vital signs reviewed and stable Respiratory status: spontaneous breathing, nonlabored ventilation, respiratory function stable and patient connected to nasal cannula oxygen Cardiovascular status: blood pressure returned to baseline and stable Postop Assessment: no apparent nausea or vomiting Anesthetic complications: no    Last Vitals:  Vitals:   09/16/19 1143 09/16/19 1154  BP: (!) 106/52 101/60  Pulse: 67 68  Resp: 13 16  Temp:    SpO2: 92% 94%    Last Pain:  Vitals:   09/16/19 1154  TempSrc:   PainSc: 0-No pain                 Quaran Kedzierski DAVID

## 2019-09-16 NOTE — Progress Notes (Signed)
Pt up and ambulated in hallway.  Bilateral groin sites unremarkable.  Pt discharged with wife.

## 2019-09-16 NOTE — Discharge Instructions (Signed)
Post procedure care instructions No driving for 4 days. No lifting over 5 lbs for 1 week. No vigorours or sexual activity for 1 week. You may return to work/your usual activities on 09/23/2019. Keep procedure site clean & dry. If you notice increased pain, swelling, bleeding or pus, call/return!  You may shower, but no soaking baths/hot tubs/pools for 1 week.     Cardiac Ablation, Care After This sheet gives you information about how to care for yourself after your procedure. Your health care provider may also give you more specific instructions. If you have problems or questions, contact your health care provider. What can I expect after the procedure? After the procedure, it is common to have:  Bruising around your puncture site.  Tenderness around your puncture site.  Skipped heartbeats.  Tiredness (fatigue). Follow these instructions at home: Puncture site care   Follow instructions from your health care provider about how to take care of your puncture site. Make sure you: ? Wash your hands with soap and water before you change your bandage (dressing). If soap and water are not available, use hand sanitizer. ? Change your dressing as told by your health care provider. ? Leave stitches (sutures), skin glue, or adhesive strips in place. These skin closures may need to stay in place for up to 2 weeks. If adhesive strip edges start to loosen and curl up, you may trim the loose edges. Do not remove adhesive strips completely unless your health care provider tells you to do that.  Check your puncture site every day for signs of infection. Check for: ? Redness, swelling, or pain. ? Fluid or blood. If your puncture site starts to bleed, lie down on your back, apply firm pressure to the area, and contact your health care provider. ? Warmth. ? Pus or a bad smell. Driving  Ask your health care provider when it is safe for you to drive again after the procedure.  Do not drive or use heavy  machinery while taking prescription pain medicine.  Do not drive for 24 hours if you were given a medicine to help you relax (sedative) during your procedure. Activity  Avoid activities that take a lot of effort for at least 3 days after your procedure.  Do not lift anything that is heavier than 10 lb (4.5 kg), or the limit that you are told, until your health care provider says that it is safe.  Return to your normal activities as told by your health care provider. Ask your health care provider what activities are safe for you. General instructions  Take over-the-counter and prescription medicines only as told by your health care provider.  Do not use any products that contain nicotine or tobacco, such as cigarettes and e-cigarettes. If you need help quitting, ask your health care provider.  Do not take baths, swim, or use a hot tub until your health care provider approves.  Do not drink alcohol for 24 hours after your procedure.  Keep all follow-up visits as told by your health care provider. This is important. Contact a health care provider if:  You have redness, mild swelling, or pain around your puncture site.  You have fluid or blood coming from your puncture site that stops after applying firm pressure to the area.  Your puncture site feels warm to the touch.  You have pus or a bad smell coming from your puncture site.  You have a fever.  You have chest pain or discomfort that spreads to  your neck, jaw, or arm.  You are sweating a lot.  You feel nauseous.  You have a fast or irregular heartbeat.  You have shortness of breath.  You are dizzy or light-headed and feel the need to lie down.  You have pain or numbness in the arm or leg closest to your puncture site. Get help right away if:  Your puncture site suddenly swells.  Your puncture site is bleeding and the bleeding does not stop after applying firm pressure to the area. These symptoms may represent a  serious problem that is an emergency. Do not wait to see if the symptoms will go away. Get medical help right away. Call your local emergency services (911 in the [place] Do not drive yourself to the hospital. Summary  After the procedure, it is normal to have bruising and tenderness at the puncture site in your groin, neck, or forearm.  Check your puncture site every day for signs of infection.  Get help right away if your puncture site is bleeding and the bleeding does not stop after applying firm pressure to the area. This is a medical emergency. This information is not intended to replace advice given to you by your health care provider. Make sure you discuss any questions you have with your health care provider. Document Released: 12/19/2016 Document Revised: 08/22/2017 Document Reviewed: 12/19/2016 Elsevier Patient Education  2020 [employer].

## 2019-09-16 NOTE — Progress Notes (Addendum)
Discharge instructions reviewed with pt and his wife (via telephone) both voice understanding.  

## 2019-09-16 NOTE — Transfer of Care (Signed)
Immediate Anesthesia Transfer of Care Note  Patient: Jon Oconnell  Procedure(s) Performed: ATRIAL FIBRILLATION ABLATION (N/A )  Patient Location: PACU and Cath Lab  Anesthesia Type:General  Level of Consciousness: awake, alert  and oriented  Airway & Oxygen Therapy: Patient Spontanous Breathing  Post-op Assessment: Report given to RN and Post -op Vital signs reviewed and stable  Post vital signs: Reviewed and stable  Last Vitals:  Vitals Value Taken Time  BP 100/49 09/16/19 1100  Temp    Pulse 71 09/16/19 1103  Resp 14 09/16/19 1103  SpO2 99 % 09/16/19 1103  Vitals shown include unvalidated device data.  Last Pain:  Vitals:   09/16/19 0549  TempSrc: Skin  PainSc:          Complications: No apparent anesthesia complications

## 2019-09-16 NOTE — H&P (Signed)
Jon Oconnell has presented today for surgery, with the diagnosis of atrial fibrillation.  The various methods of treatment have been discussed with the patient and family. After consideration of risks, benefits and other options for treatment, the patient has consented to  Procedure(s): Catheter ablation as a surgical intervention .  Risks include but not limited to bleeding, tamponade, heart block, stroke, damage to surrounding organs, among others. The patient's history has been reviewed, patient examined, no change in status, stable for surgery.  I have reviewed the patient's chart and labs.  Questions were answered to the patient's satisfaction.    Steven Basso Curt Bears, MD 09/16/2019 7:10 AM

## 2019-09-16 NOTE — Anesthesia Procedure Notes (Signed)
Procedure Name: Intubation Date/Time: 09/16/2019 7:45 AM Performed by: Valda Favia, CRNA Pre-anesthesia Checklist: Patient identified, Emergency Drugs available, Suction available, Patient being monitored and Timeout performed Patient Re-evaluated:Patient Re-evaluated prior to induction Oxygen Delivery Method: Circle system utilized Preoxygenation: Pre-oxygenation with 100% oxygen Induction Type: IV induction and Rapid sequence Laryngoscope Size: Mac and 4 Grade View: Grade III Tube type: Oral Tube size: 7.5 mm Number of attempts: 1 Airway Equipment and Method: Stylet Placement Confirmation: ETT inserted through vocal cords under direct vision,  positive ETCO2 and breath sounds checked- equal and bilateral Secured at: 24 cm Tube secured with: Tape Dental Injury: Teeth and Oropharynx as per pre-operative assessment

## 2019-09-16 NOTE — Anesthesia Preprocedure Evaluation (Signed)
Anesthesia Evaluation  Patient identified by MRN, date of birth, ID band Patient awake    Reviewed: Allergy & Precautions, NPO status , Patient's Chart, lab work & pertinent test results  Airway Mallampati: I  TM Distance: >3 FB Neck ROM: Full    Dental   Pulmonary former smoker,    Pulmonary exam normal        Cardiovascular Normal cardiovascular exam+ dysrhythmias Atrial Fibrillation + Cardiac Defibrillator      Neuro/Psych Anxiety Depression    GI/Hepatic   Endo/Other    Renal/GU      Musculoskeletal   Abdominal   Peds  Hematology   Anesthesia Other Findings   Reproductive/Obstetrics                             Anesthesia Physical Anesthesia Plan  ASA: III  Anesthesia Plan: General   Post-op Pain Management:    Induction: Intravenous  PONV Risk Score and Plan: 2  Airway Management Planned: Oral ETT  Additional Equipment:   Intra-op Plan:   Post-operative Plan: Extubation in OR  Informed Consent: I have reviewed the patients History and Physical, chart, labs and discussed the procedure including the risks, benefits and alternatives for the proposed anesthesia with the patient or authorized representative who has indicated his/her understanding and acceptance.       Plan Discussed with: CRNA and Surgeon  Anesthesia Plan Comments:         Anesthesia Quick Evaluation

## 2019-09-20 LAB — POCT ACTIVATED CLOTTING TIME: Activated Clotting Time: 307 seconds

## 2019-09-27 ENCOUNTER — Other Ambulatory Visit: Payer: Self-pay | Admitting: Cardiology

## 2019-09-27 MED ORDER — AMIODARONE HCL 200 MG PO TABS: 200.0000 mg | ORAL_TABLET | Freq: Every day | ORAL | 3 refills | Status: DC

## 2019-09-27 MED ORDER — LOSARTAN POTASSIUM 25 MG PO TABS: 12.5000 mg | ORAL_TABLET | Freq: Every day | ORAL | 3 refills | Status: DC

## 2019-09-27 MED ORDER — APIXABAN 5 MG PO TABS: 5.0000 mg | ORAL_TABLET | Freq: Two times a day (BID) | ORAL | 5 refills | Status: DC

## 2019-09-27 MED ORDER — TORSEMIDE 20 MG PO TABS: 20.0000 mg | ORAL_TABLET | Freq: Every day | ORAL | 3 refills | Status: DC

## 2019-09-27 NOTE — Telephone Encounter (Signed)
New message   *STAT* If patient is at the pharmacy, call can be transferred to refill team.   1. Which medications need to be refilled? (please list name of each medication and dose if known) amiodarone (PACERONE) 200 MG tablet apixaban (ELIQUIS) 5 MG TABS tablet meclizine (ANTIVERT) 25 MG tablet losartan (COZAAR) 25 MG tablet torsemide (DEMADEX) 20 MG tablet  2. Which pharmacy/location (including street and city if local pharmacy) is medication to be sent to? [place], [place]  3. Do they need a 30 day or 90 day supply? 90 day

## 2019-09-27 NOTE — Telephone Encounter (Signed)
Pt last saw Dr Curt Bears 09/09/19 telemedicine Covid-19, last labs 09/16/19 Creat 1.64, age 69, weight 83.5kg, based on specified criteria pt is on appropriate dosage of Eliquis 5mg  BID.  Will refill rx.

## 2019-09-29 ENCOUNTER — Other Ambulatory Visit: Payer: Self-pay | Admitting: *Deleted

## 2019-09-29 MED ORDER — APIXABAN 5 MG PO TABS: 5.0000 mg | ORAL_TABLET | Freq: Two times a day (BID) | ORAL | 5 refills | Status: AC

## 2019-09-29 NOTE — Telephone Encounter (Signed)
Refill was sent on 09/27/2019 by another RN but just received an E-prescribing error so will resend.  Eliquis 5mg  refill request received, pt is 69 yrs old, weight-83.5kg, Crea-1.64 on 09/16/2019, Diagnosis-Afib, and last seen by Dr. Curt Bears via Telemedicine Visit on 09/09/2019 . Dose is appropriate based on dosing criteria. Will send in refill to requested pharmacy.

## 2019-10-14 ENCOUNTER — Ambulatory Visit (HOSPITAL_COMMUNITY)
Admission: RE | Admit: 2019-10-14 | Discharge: 2019-10-14 | Disposition: A | Discharge status: Home / Self Care | Payer: No Typology Code available for payment source | Source: Ambulatory Visit | Attending: Physician Assistant | Admitting: Physician Assistant

## 2019-10-14 ENCOUNTER — Other Ambulatory Visit: Payer: Self-pay

## 2019-10-14 VITALS — BP 106/66 | HR 69 | Ht 69.0 in | Wt 187.4 lb

## 2019-10-14 DIAGNOSIS — Z823 Family history of stroke: Secondary | ICD-10-CM | POA: Insufficient documentation

## 2019-10-14 DIAGNOSIS — D6869 Other thrombophilia: Secondary | ICD-10-CM | POA: Insufficient documentation

## 2019-10-14 DIAGNOSIS — I5022 Chronic systolic (congestive) heart failure: Secondary | ICD-10-CM | POA: Insufficient documentation

## 2019-10-14 DIAGNOSIS — I440 Atrioventricular block, first degree: Secondary | ICD-10-CM | POA: Insufficient documentation

## 2019-10-14 DIAGNOSIS — Z87891 Personal history of nicotine dependence: Secondary | ICD-10-CM | POA: Insufficient documentation

## 2019-10-14 DIAGNOSIS — Z8572 Personal history of non-Hodgkin lymphomas: Secondary | ICD-10-CM | POA: Insufficient documentation

## 2019-10-14 DIAGNOSIS — Z7901 Long term (current) use of anticoagulants: Secondary | ICD-10-CM | POA: Insufficient documentation

## 2019-10-14 DIAGNOSIS — I428 Other cardiomyopathies: Secondary | ICD-10-CM | POA: Insufficient documentation

## 2019-10-14 DIAGNOSIS — Z803 Family history of malignant neoplasm of breast: Secondary | ICD-10-CM | POA: Insufficient documentation

## 2019-10-14 DIAGNOSIS — I4819 Other persistent atrial fibrillation: Secondary | ICD-10-CM | POA: Diagnosis not present

## 2019-10-14 DIAGNOSIS — F418 Other specified anxiety disorders: Secondary | ICD-10-CM | POA: Insufficient documentation

## 2019-10-14 DIAGNOSIS — G4733 Obstructive sleep apnea (adult) (pediatric): Secondary | ICD-10-CM | POA: Insufficient documentation

## 2019-10-14 DIAGNOSIS — I444 Left anterior fascicular block: Secondary | ICD-10-CM | POA: Insufficient documentation

## 2019-10-14 DIAGNOSIS — Z79899 Other long term (current) drug therapy: Secondary | ICD-10-CM | POA: Insufficient documentation

## 2019-10-14 DIAGNOSIS — E785 Hyperlipidemia, unspecified: Secondary | ICD-10-CM | POA: Insufficient documentation

## 2019-10-14 DIAGNOSIS — Z95810 Presence of automatic (implantable) cardiac defibrillator: Secondary | ICD-10-CM | POA: Insufficient documentation

## 2019-10-14 DIAGNOSIS — R9431 Abnormal electrocardiogram [ECG] [EKG]: Secondary | ICD-10-CM | POA: Insufficient documentation

## 2019-10-14 NOTE — Progress Notes (Signed)
Primary Care Physician: [facility] Primary Cardiologist: Dr [name] Primary Electrophysiologist: Dr Curt Bears Referring Physician: Dr Henrine Screws is a 69 y.o. male with a history of nonischemic cardiomyopathy s/p ICD, prior non-Hodgkins lymphoma, OSA, and persistent atrial fibrillation who presents for follow up in the [facility]. In 2014 he had a left heart catheterization that showed no obstructive coronary artery disease. He went to the hospital in [place] and was found to have atrial fibrillation with rapid rates.  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  He was admitted to [facility] 04/20/2019 with decompensated heart failure and was diuresed.  He underwent TEE guided cardioversion to sinus rhythm on 05/13/2019.  TEE at the time showed an EF of 30 to 35% with no left atrial thrombus. Patient is s/p afib ablation with Dr Curt Bears on 09/16/19. Patient reports that he feels well with no heart racing or palpitations. He denies CP, swallowing, or groin issues.  Today, he denies symptoms of palpitations, chest pain, shortness of breath, orthopnea, PND, lower extremity edema, dizziness, presyncope, syncope, snoring, daytime somnolence, bleeding, or neurologic sequela. The patient is tolerating medications without difficulties and is otherwise without complaint today.    Atrial Fibrillation Risk Factors:  he does have symptoms or diagnosis of sleep apnea. he is not compliant with CPAP therapy.   he has a BMI of Body mass index is 27.67 kg/m.Marland Kitchen Filed Weights   10/14/19 0909  Weight: 85 kg    Family History  Problem Relation Age of Onset  . Breast cancer Mother   . Stroke Father      Atrial Fibrillation Management history:  Previous antiarrhythmic drugs: amiodarone Previous cardioversions: 05/19/19 Previous ablations:  09/16/19 CHADS2VASC score: 2 Anticoagulation history: Eliquis   Past Medical History:  Diagnosis Date  . AICD (automatic cardioverter/defibrillator) present   . Atrial fibrillation with RVR ([place]) 04/08/2019  . Cardiac defibrillator in place 04/08/2019  . Chronic congestive heart failure ([place]) 04/08/2019  . Depression with anxiety 04/08/2019  . Dilated cardiomyopathy ([place]) 04/09/2019  . Dyspnea    with exertion  . Dysrhythmia    A-fib  . Hyperlipidemia 04/08/2019  . 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 NICM (nonischemic cardiomyopathy) ([place])   . Non-ischemic cardiomyopathy ([place]) 03/03/2017  . 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    Past Surgical History:  Procedure Laterality Date  . ATRIAL FIBRILLATION ABLATION N/A 09/16/2019   Procedure: ATRIAL FIBRILLATION ABLATION;  Surgeon: Constance Haw, MD;  Location: [place] [facility];  Service: Cardiovascular;  Laterality: N/A;  . CARDIOVERSION N/A 05/19/2019   Procedure: CARDIOVERSION;  Surgeon: Larey Dresser, MD;  Location: [facility] ENDOSCOPY;  Service: Cardiovascular;  Laterality: N/A;  . ICD IMPLANT Left   . RIGHT/LEFT HEART CATH AND CORONARY ANGIOGRAPHY N/A 04/23/2019   Procedure: RIGHT/LEFT HEART CATH AND CORONARY ANGIOGRAPHY;  Surgeon: Larey Dresser, MD;  Location: [place] [facility];  Service: Cardiovascular;  Laterality: N/A;  . TEE WITHOUT CARDIOVERSION N/A 05/19/2019   Procedure: TRANSESOPHAGEAL ECHOCARDIOGRAM (TEE);  Surgeon: Larey Dresser, MD;  Location: [facility] ENDOSCOPY;  Service: Cardiovascular;  Laterality: N/A;    Current Outpatient Medications  Medication Sig Dispense Refill  . amiodarone (PACERONE) 200 MG tablet Take 1 tablet (200 mg total) by mouth daily. 90 tablet 3  . apixaban (ELIQUIS) 5 MG TABS tablet Take 1 tablet (5  mg total) by mouth 2 (two) times daily. 60 tablet 5  . digoxin (LANOXIN) 0.125 MG tablet Take 0.125 mg by mouth daily.    Marland Kitchen eplerenone (INSPRA) 25 MG tablet Take 1 tablet  (25 mg total) by mouth daily. 90 tablet 3  . ipratropium (ATROVENT) 0.02 % nebulizer solution Take 0.5 mg by nebulization every 6 (six) hours as needed for wheezing or shortness of breath.    . losartan (COZAAR) 25 MG tablet Take 0.5 tablets (12.5 mg total) by mouth at bedtime. 45 tablet 3  . meclizine (ANTIVERT) 25 MG tablet Take 1 tablet (25 mg total) by mouth 2 (two) times daily as needed for dizziness. 60 tablet 2  . metoprolol succinate (TOPROL-XL) 25 MG 24 hr tablet Take 1 tablet (25 mg total) by mouth 2 (two) times a day. Take with or immediately following a meal. 180 tablet 3  . omeprazole (PRILOSEC) 20 MG capsule Take 40 mg by mouth daily.    . sertraline (ZOLOFT) 50 MG tablet Take 50 mg by mouth daily.     . simvastatin (ZOCOR) 80 MG tablet Take 40 mg by mouth at bedtime.    . torsemide (DEMADEX) 20 MG tablet Take 1 tablet (20 mg total) by mouth daily. 90 tablet 3   No current facility-administered medications for this encounter.    No Known Allergies  Social History   Socioeconomic History  . Marital status: Married    Spouse name: Not on file  . Number of children: Not on file  . Years of education: Not on file  . Highest education level: Not on file  Occupational History  . Not on file  Tobacco Use  . Smoking status: Former Research scientist (life sciences)  . Smokeless tobacco: Never Used  Substance and Sexual Activity  . Alcohol use: Not Currently  . Drug use: Never  . Sexual activity: Not Currently  Other Topics Concern  . Not on file  Social History Narrative  . Not on file   Social Determinants of Health   Financial Resource Strain:   . Difficulty of Paying Living Expenses: Not on file  Food Insecurity:   . Worried About Charity fundraiser in the Last Year: Not on file  . Ran Out of Food in the Last Year: Not on file  Transportation Needs:   . Lack of Transportation (Medical): Not on file  . Lack of Transportation (Non-Medical): Not on file  Physical Activity:   . Days of  Exercise per Week: Not on file  . Minutes of Exercise per Session: Not on file  Stress:   . Feeling of Stress : Not on file  Social Connections:   . Frequency of Communication with Friends and Family: Not on file  . Frequency of Social Gatherings with Friends and Family: Not on file  . Attends Religious Services: Not on file  . Active Member of Clubs or Organizations: Not on file  . Attends Archivist Meetings: Not on file  . Marital Status: Not on file  Intimate Partner Violence:   . Fear of Current or Ex-Partner: Not on file  . Emotionally Abused: Not on file  . Physically Abused: Not on file  . Sexually Abused: Not on file     ROS- All systems are reviewed and negative except as per the HPI above.  Physical Exam: Vitals:   10/14/19 0909  BP: 106/66  Pulse: 69  Weight: 85 kg  Height: 5\' 9"  (1.753 m)    GEN- The  patient is well appearing, alert and oriented x 3 today.   Head- normocephalic, atraumatic Eyes-  Sclera clear, conjunctiva pink Ears- hearing intact Oropharynx- clear Neck- supple  Lungs- Clear to ausculation bilaterally, normal work of breathing Heart- Regular rate and rhythm, no murmurs, rubs or gallops  GI- soft, NT, ND, + BS Extremities- no clubbing, cyanosis, or edema MS- no significant deformity or atrophy Skin- no rash or lesion Psych- euthymic mood, full affect Neuro- strength and sensation are intact  Wt Readings from Last 3 Encounters:  10/14/19 85 kg  09/16/19 83.5 kg  08/23/19 84 kg    EKG today demonstrates SR HR 69, 1st degree AV block, LAFB, PR 222, QRS 100, QTc 475  Echo 09/08/19 demonstrated  1. Left ventricular ejection fraction, by visual estimation, is 30 to 35%. The left ventricle has moderately decreased function. There is no left ventricular hypertrophy.  2. Left ventricular diastolic parameters are consistent with Grade I diastolic dysfunction (impaired relaxation).  3. The left ventricle demonstrates global  hypokinesis.  4. Global right ventricle has normal systolic function.The right ventricular size is normal. No increase in right ventricular wall thickness.  5. A pacer wire is visualized in the RV.  6. Left atrial size was moderately dilated.  7. Right atrial size was normal.  8. The mitral valve is normal in structure. Trivial mitral valve regurgitation. No evidence of mitral stenosis.  9. The tricuspid valve is normal in structure. Tricuspid valve regurgitation is trivial. 10. The aortic valve is tricuspid. Aortic valve regurgitation is not visualized. No evidence of aortic valve sclerosis or stenosis. 11. The tricuspid regurgitant velocity is 2.58 m/s, and with an assumed right atrial pressure of 3 mmHg, the estimated right ventricular systolic pressure is normal at 29.6 mmHg. 12. The inferior vena cava is normal in size with greater than 50% respiratory variability, suggesting right atrial pressure of 3 mmHg.   Epic records are reviewed at length today  CHA2DS2-VASc Score = 2 The patient's score is based upon: CHF History: Yes HTN History: No Age : 51-74 Diabetes History: No Stroke History: No Vascular Disease History: No Gender: Male      ASSESSMENT AND PLAN: 1. Persistent Atrial Fibrillation (ICD10:  I48.19) The patient's CHA2DS2-VASc score is 2, indicating a 2.2% annual risk of stroke.   S/p afib ablation with Dr Curt Bears 09/16/19. Patient appears to be maintaining SR. Continue amiodarone 200 mg daily for now Continue digoxin 0.125 mg daily Continue Toprol 25 mg BID  2. Secondary Hypercoagulable State (ICD10:  D68.69) The patient is at significant risk for stroke/thromboembolism based upon his CHA2DS2-VASc Score of 2.  Continue Apixaban (Eliquis).   3. Obstructive sleep apnea The importance of adequate treatment of sleep apnea was discussed today in order to improve our ability to maintain sinus rhythm long term. Encourage compliance with CPAP.  4. Chronic systolic  CHF NICM, s/p ICD No signs or symptoms of fluid overload. Followed by Dr [name].   Follow up with Dr [name] and Dr Curt Bears as scheduled.   [facility] [address] [place], [place] [address] [phone] 10/14/2019 9:48 AM

## 2019-10-21 ENCOUNTER — Other Ambulatory Visit: Payer: Self-pay

## 2019-10-21 ENCOUNTER — Ambulatory Visit (HOSPITAL_COMMUNITY)
Admission: RE | Admit: 2019-10-21 | Discharge: 2019-10-21 | Disposition: A | Discharge status: Home / Self Care | Payer: No Typology Code available for payment source | Source: Ambulatory Visit | Attending: Cardiology | Admitting: Cardiology

## 2019-10-21 VITALS — BP 100/62 | HR 73 | Wt 190.8 lb

## 2019-10-21 DIAGNOSIS — I444 Left anterior fascicular block: Secondary | ICD-10-CM | POA: Insufficient documentation

## 2019-10-21 DIAGNOSIS — Z79899 Other long term (current) drug therapy: Secondary | ICD-10-CM | POA: Insufficient documentation

## 2019-10-21 DIAGNOSIS — Z7901 Long term (current) use of anticoagulants: Secondary | ICD-10-CM | POA: Insufficient documentation

## 2019-10-21 DIAGNOSIS — Z87891 Personal history of nicotine dependence: Secondary | ICD-10-CM | POA: Insufficient documentation

## 2019-10-21 DIAGNOSIS — I4891 Unspecified atrial fibrillation: Secondary | ICD-10-CM | POA: Insufficient documentation

## 2019-10-21 DIAGNOSIS — Z823 Family history of stroke: Secondary | ICD-10-CM | POA: Insufficient documentation

## 2019-10-21 DIAGNOSIS — Q245 Malformation of coronary vessels: Secondary | ICD-10-CM | POA: Insufficient documentation

## 2019-10-21 DIAGNOSIS — I4819 Other persistent atrial fibrillation: Secondary | ICD-10-CM

## 2019-10-21 DIAGNOSIS — I5022 Chronic systolic (congestive) heart failure: Secondary | ICD-10-CM | POA: Diagnosis not present

## 2019-10-21 DIAGNOSIS — I440 Atrioventricular block, first degree: Secondary | ICD-10-CM | POA: Insufficient documentation

## 2019-10-21 DIAGNOSIS — G4733 Obstructive sleep apnea (adult) (pediatric): Secondary | ICD-10-CM | POA: Insufficient documentation

## 2019-10-21 DIAGNOSIS — I428 Other cardiomyopathies: Secondary | ICD-10-CM | POA: Insufficient documentation

## 2019-10-21 DIAGNOSIS — Z803 Family history of malignant neoplasm of breast: Secondary | ICD-10-CM | POA: Insufficient documentation

## 2019-10-21 DIAGNOSIS — C8590 Non-Hodgkin lymphoma, unspecified, unspecified site: Secondary | ICD-10-CM | POA: Insufficient documentation

## 2019-10-21 DIAGNOSIS — Z95810 Presence of automatic (implantable) cardiac defibrillator: Secondary | ICD-10-CM | POA: Insufficient documentation

## 2019-10-21 DIAGNOSIS — N183 Chronic kidney disease, stage 3 unspecified: Secondary | ICD-10-CM | POA: Insufficient documentation

## 2019-10-21 LAB — COMPREHENSIVE METABOLIC PANEL
ALT: 30 U/L (ref 0–44)
AST: 24 U/L (ref 15–41)
Albumin: 3.9 g/dL (ref 3.5–5.0)
Alkaline Phosphatase: 77 U/L (ref 38–126)
Anion gap: 12 (ref 5–15)
BUN: 23 mg/dL (ref 8–23)
CO2: 25 mmol/L (ref 22–32)
Calcium: 9.5 mg/dL (ref 8.9–10.3)
Chloride: 99 mmol/L (ref 98–111)
Creatinine, Ser: 1.78 mg/dL — ABNORMAL HIGH (ref 0.61–1.24)
GFR calc Af Amer: 44 mL/min — ABNORMAL LOW (ref >60)
GFR calc non Af Amer: 38 mL/min — ABNORMAL LOW (ref >60)
Glucose, Bld: 115 mg/dL — ABNORMAL HIGH (ref 70–99)
Potassium: 4.2 mmol/L (ref 3.5–5.1)
Sodium: 136 mmol/L (ref 135–145)
Total Bilirubin: 0.9 mg/dL (ref 0.3–1.2)
Total Protein: 6.9 g/dL (ref 6.5–8.1)

## 2019-10-21 LAB — CBC
HCT: 41.9 % (ref 39.0–52.0)
Hemoglobin: 13.6 g/dL (ref 13.0–17.0)
MCH: 29.1 pg (ref 26.0–34.0)
MCHC: 32.5 g/dL (ref 30.0–36.0)
MCV: 89.5 fL (ref 80.0–100.0)
Platelets: 173 10*3/uL (ref 150–400)
RBC: 4.68 MIL/uL (ref 4.22–5.81)
RDW: 13.2 % (ref 11.5–15.5)
WBC: 10.5 10*3/uL (ref 4.0–10.5)
nRBC: 0 % (ref 0.0–0.2)

## 2019-10-21 LAB — DIGOXIN LEVEL: Digoxin Level: 0.8 ng/mL (ref 0.8–2.0)

## 2019-10-21 LAB — TSH: TSH: 2.209 u[IU]/mL (ref 0.350–4.500)

## 2019-10-21 MED ORDER — AMIODARONE HCL 200 MG PO TABS: 100.0000 mg | ORAL_TABLET | Freq: Every day | ORAL | 3 refills | Status: DC

## 2019-10-21 MED ORDER — LOSARTAN POTASSIUM 25 MG PO TABS: 25.0000 mg | ORAL_TABLET | Freq: Every day | ORAL | 3 refills | Status: DC

## 2019-10-21 NOTE — Progress Notes (Signed)
PCP: [facility] Cardiology: Dr. Stanford Breed HF Cardiology: Dr. [name]  69 y.o. with history of nonischemic cardiomyopathy, prior non-Hodgkins lymphoma, and atrial fibrillation returns for followup of CHF and atrial fibrillation.    Patient has a long-standing history of CHF.  In 2014, EF was 30-35%.  Cath at that time showed no obstructive CAD.  He has a Medtronic ICD. He recently moved to [place]. In 7/20, he was seen at the [facility] in [place] due to worsening dyspnea and palpitations.  He was found to be in atrial fibrillation with RVR.  He was started on Eliquis and TEE-guided DCCV was planned, but TEE showed LA thrombus so he did not have the cardioversion.  The TEE showed EF 25-30% with diffuse hypokinesis, normal RV, mild MR, moderate TR.  He was discharged home on Lasix 20 mg daily. Since then, he continued to be short of breath with exertion.    He was then admitted to [facility] on 04/20/19 with decompensated CHF.  He was diuresed.  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    He had TEE-guided DCCV to NSR in 8/20.  TEE showed EF 30-35% with no LA appendage thrombus.   He had an atrial fibrillation ablation in 12/20.   He remains in NSR today, no palpitations.  He rarely has some mild lightheadedness with standing.  Walking regularly, no problems walking up hills.  Overall feels better with minimal exertional dyspnea.  No chest pain.    ECG (personally reviewed): NSR, 1st degree AV block, anteroseptal Qs, LAFB  Labs (8/20): K 4, creatinine 1.7 => 1.48 Labs (9/20): K 4.6, creatinine 1.27, LFTs normal, TSH normal, digoxin level 0.9, hgb 14 Labs (11/20): digoxin 0.4 Labs (12/20): K 3.9, creatinine 1.64  Past Medical History: 1. Non-Hodgkins lymphoma: Remote, in remission.  2. Atrial fibrillation: First noted in 7/20.  TEE in 7/20 showed left  atrial appendage thrombus.  - DCCV to NSR in 8/20.   - Atrial fibrillation ablation in 12/20 3. Chronic systolic CHF: Nonischemic cardiomyopathy.  Echo in 2014 with EF 30-35%, cath at that time showed no significant CAD.  - He has a Medtronic ICD.  - TEE (7/20): EF 25-30% with diffuse hypokinesis, normal RV, moderate TR, mild MR.  There was a LA appendage thrombus.  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  Mean RA 3, PA 34/14, mean PCWP 10, CI 1.81.  - TEE (8/20): EF 30-35%, mildly decreased RV systolic function, no LA appendage thrombus.  4. CKD: Stage 3.  5. OSA: Not using CPAP.  6. Syncope (11/20): Suspect dehydrated, taking both furosemide and torsemide.   Social History   Socioeconomic History  . Marital status: Married    Spouse name: Not on file  . Number of children: Not on file  . Years of education: Not on file  . Highest education level: Not on file  Occupational History  . Not on file  Tobacco Use  . Smoking status: Former Research scientist (life sciences)  . Smokeless tobacco: Never Used  Substance and Sexual Activity  . Alcohol use: Not Currently  . Drug use: Never  . Sexual activity: Not Currently  Other Topics Concern  . Not on file  Social History Narrative  . Not on file   Social Determinants of Health   Financial  Resource Strain:   . Difficulty of Paying Living Expenses: Not on file  Food Insecurity:   . Worried About Charity fundraiser in the Last Year: Not on file  . Ran Out of Food in the Last Year: Not on file  Transportation Needs:   . Lack of Transportation (Medical): Not on file  . Lack of Transportation (Non-Medical): Not on file  Physical Activity:   . Days of Exercise per Week: Not on file  . Minutes of Exercise per Session: Not on file  Stress:   . Feeling of Stress : Not on file  Social Connections:   . Frequency of Communication with Friends and  Family: Not on file  . Frequency of Social Gatherings with Friends and Family: Not on file  . Attends Religious Services: Not on file  . Active Member of Clubs or Organizations: Not on file  . Attends Archivist Meetings: Not on file  . Marital Status: Not on file  Intimate Partner Violence:   . Fear of Current or Ex-Partner: Not on file  . Emotionally Abused: Not on file  . Physically Abused: Not on file  . Sexually Abused: Not on file   Family History  Problem Relation Age of Onset  . Breast cancer Mother   . Stroke Father    ROS: All systems reviewed and negative except as per HPI.   Current Outpatient Medications  Medication Sig Dispense Refill  . amiodarone (PACERONE) 200 MG tablet Take 0.5 tablets (100 mg total) by mouth daily. 45 tablet 3  . apixaban (ELIQUIS) 5 MG TABS tablet Take 1 tablet (5 mg total) by mouth 2 (two) times daily. 60 tablet 5  . digoxin (LANOXIN) 0.125 MG tablet Take 0.125 mg by mouth daily.    Marland Kitchen eplerenone (INSPRA) 25 MG tablet Take 1 tablet (25 mg total) by mouth daily. 90 tablet 3  . ipratropium (ATROVENT) 0.02 % nebulizer solution Take 0.5 mg by nebulization every 6 (six) hours as needed for wheezing or shortness of breath.    . losartan (COZAAR) 25 MG tablet Take 1 tablet (25 mg total) by mouth at bedtime. 90 tablet 3  . meclizine (ANTIVERT) 25 MG tablet Take 1 tablet (25 mg total) by mouth 2 (two) times daily as needed for dizziness. 60 tablet 2  . metoprolol succinate (TOPROL-XL) 25 MG 24 hr tablet Take 1 tablet (25 mg total) by mouth 2 (two) times a day. Take with or immediately following a meal. 180 tablet 3  . omeprazole (PRILOSEC) 20 MG capsule Take 40 mg by mouth daily.    . sertraline (ZOLOFT) 50 MG tablet Take 50 mg by mouth daily.     . simvastatin (ZOCOR) 80 MG tablet Take 40 mg by mouth at bedtime.    . torsemide (DEMADEX) 20 MG tablet Take 1 tablet (20 mg total) by mouth daily. 90 tablet 3   No current facility-administered  medications for this encounter.   BP 100/62   Pulse 73   Wt 86.5 kg (190 lb 12.8 oz)   SpO2 96%   BMI 28.18 kg/m  General: NAD Neck: No JVD, no thyromegaly or thyroid nodule.  Lungs: Clear to auscultation bilaterally with normal respiratory effort. CV: Nondisplaced PMI.  Heart regular S1/S2, no S3/S4, no murmur.  No peripheral edema.  No carotid bruit.  Normal pedal pulses.  Abdomen: Soft, nontender, no hepatosplenomegaly, no distention.  Skin: Intact without lesions or rashes.  Neurologic: Alert and oriented x 3.  Psych:  Normal affect. Extremities: No clubbing or cyanosis.  HEENT: Normal.   Assessment/Plan: 1. Chronic systolic CHF: Patient has had a nonischemic cardiomyopathy since at least 2014, at that time echo showed EF 30-35%. 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 Medtronic ICD. 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 HIV negative 7/20. TEE in [place] in 7/20 showed EF 25-30% with diffuse hypokinesis, normal RV, mild MR, moderate TR. 7/20 CHF exacerbation appears to have been triggered by new-onset atrial fibrillation. RHC in 8/20 showed normal filling pressures but low cardiac output.  He had TEE-guided DCCV in 8/20 (TEE showed EF 30-35%) then atrial fibrillation ablation in 12/20, and he remains in NSR today.  NYHA class I-II symptoms per his report.  Not volume overloaded on exam.   - Continue torsemide 20 mg daily.  BMET today.    - Continue digoxin, check digoxin level today.   - Continue Toprol XL 25 mg bid.  - Continue eplerenone 25 mg daily.  - No further orthostatic symptoms, increase losartan back to 25 mg qhs.  I do not think that he has BP room to start Entresto at this time.  BMET in 10 days.  - QRS not wide, no indication for CRT upgrade.  - Repeat echo at followup in 3 months.  2. Atrial fibrillation: S/p atrial fibrillation ablation in 12/20, he is in  NSR today.   - Decrease amiodarone to 100 mg daily.  Check LFTs and TSH today, he will need a regular eye exam.  Hopefully can eventually stop amiodarone now that he has had ablation.   - Continue Eliquis.   3. CKD stage 3: BMET today.  4. 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  It would be helpful to do a coronary CTA to assess the courses of the LAD/LCx (?malignant interarterial).  However, he is 43 and does not appear to have had an arrhythmia or chest pain related to anomalous coronaries so would be very unlikely to recommend CABG, etc.    Followup in 3 months with echo.   [name] 10/21/2019

## 2019-10-21 NOTE — Patient Instructions (Signed)
Decrease Amiodarone to 100 mg (1/2 tab) daily  Increase Losartan to 25 mg daily  Labs done today, we will call you for abnormal results  Please get labs drawn in 7-10 days, we have provided you with a prescription for this, you can take the prescription to any lab in your area or your Primary Care doctor's office  Please use your CPAP nightly  Your physician recommends that you schedule a follow-up appointment in: 3 months with echocardiogram  If you have any questions or concerns before your next appointment please send [place] a message through mychart or call our office at [phone].  At the [facility], you and your health needs are our priority. As part of our continuing mission to provide you with exceptional heart care, we have created designated Provider Care Teams. These Care Teams include your primary Cardiologist (physician) and Advanced Practice Providers (APPs- Physician Assistants and Nurse Practitioners) who all work together to provide you with the care you need, when you need it.   You may see any of the following providers on your designated Care Team at your next follow up: Marland Kitchen Dr Glori Bickers . Dr [name] . Darrick Grinder, NP . Lyda Jester, PA . Audry Riles, PharmD   Please be sure to bring in all your medications bottles to every appointment.

## 2019-11-22 LAB — CUP PACEART INCLINIC DEVICE CHECK
Date Time Interrogation Session: 20201109082751
Implantable Lead Implant Date: 20140818
Implantable Lead Location: 753860
Implantable Pulse Generator Implant Date: 20140818

## 2019-12-21 ENCOUNTER — Other Ambulatory Visit: Payer: Self-pay

## 2019-12-21 ENCOUNTER — Encounter: Payer: Self-pay | Admitting: Cardiology

## 2019-12-21 ENCOUNTER — Ambulatory Visit (INDEPENDENT_AMBULATORY_CARE_PROVIDER_SITE_OTHER): Payer: Self-pay | Admitting: Cardiology

## 2019-12-21 VITALS — BP 122/72 | HR 69 | Ht 69.0 in | Wt 192.0 lb

## 2019-12-21 DIAGNOSIS — I4819 Other persistent atrial fibrillation: Secondary | ICD-10-CM

## 2019-12-21 LAB — CUP PACEART INCLINIC DEVICE CHECK
Battery Remaining Longevity: 68 mo
Battery Voltage: 2.98 V
Brady Statistic RV Percent Paced: 0.01 %
Date Time Interrogation Session: 20210330115459
HighPow Impedance: 75 Ohm
Implantable Lead Implant Date: 20140818
Implantable Lead Location: 753860
Implantable Pulse Generator Implant Date: 20140818
Lead Channel Impedance Value: 380 Ohm
Lead Channel Impedance Value: 456 Ohm
Lead Channel Pacing Threshold Amplitude: 0.75 V
Lead Channel Pacing Threshold Pulse Width: 0.4 ms
Lead Channel Sensing Intrinsic Amplitude: 17.875 mV
Lead Channel Setting Pacing Amplitude: 2 V
Lead Channel Setting Pacing Pulse Width: 0.4 ms
Lead Channel Setting Sensing Sensitivity: 0.3 mV

## 2019-12-21 NOTE — Patient Instructions (Signed)
Medication Instructions:  Your physician has recommended you make the following change in your medication:  1. STOP Amiodarone  *If you need a refill on your cardiac medications before your next appointment, please call your pharmacy*   Lab Work: None ordered If you have labs (blood work) drawn today and your tests are completely normal, you will receive your results only by: Marland Kitchen MyChart Message (if you have MyChart) OR . A paper copy in the mail If you have any lab test that is abnormal or we need to change your treatment, we will call you to review the results.   Testing/Procedures: None ordered   Follow-Up: At [facility], you and your health needs are our priority.  As part of our continuing mission to provide you with exceptional heart care, we have created designated Provider Care Teams.  These Care Teams include your primary Cardiologist (physician) and Advanced Practice Providers (APPs -  Physician Assistants and Nurse Practitioners) who all work together to provide you with the care you need, when you need it.  We recommend signing up for the patient portal called "MyChart".  Sign up information is provided on this After Visit Summary.  MyChart is used to connect with patients for Virtual Visits (Telemedicine).  Patients are able to view lab/test results, encounter notes, upcoming appointments, etc.  Non-urgent messages can be sent to your provider as well.   To learn more about what you can do with MyChart, go to [email or web address].    Your next appointment:   6 month(s)  The format for your next appointment:   In Person  Provider:   Allegra Lai, MD   Thank you for choosing [facility]!!   Trinidad Curet, RN [phone]    Other Instructions

## 2019-12-21 NOTE — Progress Notes (Signed)
Electrophysiology Office Note   Date:  12/21/2019   ID:  Jon Oconnell, DOB Jun 10, 1951, MRN [id number]  PCP:  [facility] [place]  Cardiologist:  Marica Otter Primary Electrophysiologist:  Rodriques Badie Meredith Leeds, MD    Chief Complaint: CHF   History of Present Illness: Jon Oconnell is a 69 y.o. male who is being seen today for the evaluation of CHF at the request of Kirk Ruths. Presenting today for electrophysiology evaluation.  He has a history significant for nonischemic cardiomyopathy, prior Hodgkin's lymphoma, and atrial fibrillation.  In 2014 he had a left heart catheterization that showed no obstructive coronary artery disease.  He has a Medtronic ICD.  He went to the hospital in [place] and was found to have atrial fibrillation with rapid rates.  He was started on Eliquis and had a planned TEE and cardioversion, but a left atrial thrombus was found.  He was admitted to [facility] 04/20/2019 with decompensated heart failure and was diuresed.  He underwent TEE guided cardioversion to sinus rhythm on 05/13/2019.  TEE at the time showed an EF of 30 to 35% with no left atrial thrombus.  Today, denies symptoms of palpitations, chest pain, shortness of breath, orthopnea, PND, lower extremity edema, claudication, dizziness, presyncope, syncope, bleeding, or neurologic sequela. The patient is tolerating medications without difficulties.  Overall he is doing well.  He is noted no further episodes of atrial fibrillation.  He has had a few episodes where he has passed out.  Each episode, he was kneeling down, turning his head, and standing up quickly.  This has not happened at any other time.  Aside from that he has felt well without complaint.   Past Medical History:  Diagnosis Date  . AICD (automatic cardioverter/defibrillator) present   . Atrial fibrillation with RVR ([place]) 04/08/2019  . Cardiac defibrillator in place 04/08/2019  . Chronic congestive heart failure ([place])  04/08/2019  . Depression with anxiety 04/08/2019  . Dilated cardiomyopathy ([place]) 04/09/2019  . Dyspnea    with exertion  . Dysrhythmia    A-fib  . Hyperlipidemia 04/08/2019  . 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 NICM (nonischemic cardiomyopathy) ([place])   . Non-ischemic cardiomyopathy ([place]) 03/03/2017  . 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    Past Surgical History:  Procedure Laterality Date  . ATRIAL FIBRILLATION ABLATION N/A 09/16/2019   Procedure: ATRIAL FIBRILLATION ABLATION;  Surgeon: Constance Haw, MD;  Location: [place] CV LAB;  Service: Cardiovascular;  Laterality: N/A;  . CARDIOVERSION N/A 05/19/2019   Procedure: CARDIOVERSION;  Surgeon: Larey Dresser, MD;  Location: [facility] ENDOSCOPY;  Service: Cardiovascular;  Laterality: N/A;  . ICD IMPLANT Left   . RIGHT/LEFT HEART CATH AND CORONARY ANGIOGRAPHY N/A 04/23/2019   Procedure: RIGHT/LEFT HEART CATH AND CORONARY ANGIOGRAPHY;  Surgeon: Larey Dresser, MD;  Location: [place] CV LAB;  Service: Cardiovascular;  Laterality: N/A;  . TEE WITHOUT CARDIOVERSION N/A 05/19/2019   Procedure: TRANSESOPHAGEAL ECHOCARDIOGRAM (TEE);  Surgeon: Larey Dresser, MD;  Location: [facility] ENDOSCOPY;  Service: Cardiovascular;  Laterality: N/A;     Current Outpatient Medications  Medication Sig Dispense Refill  . amiodarone (PACERONE) 200 MG tablet Take 0.5 tablets (100 mg total) by mouth daily. 45 tablet 3  . apixaban (ELIQUIS) 5 MG TABS tablet Take 1 tablet (5 mg total) by mouth 2 (two) times daily. 60 tablet 5  . digoxin (LANOXIN) 0.125 MG tablet Take 0.125 mg by mouth daily.    Marland Kitchen eplerenone (INSPRA) 25 MG tablet  Take 1 tablet (25 mg total) by mouth daily. 90 tablet 3  . ipratropium (ATROVENT) 0.02 % nebulizer solution Take 0.5 mg by nebulization every 6 (six) hours as needed for wheezing or shortness of breath.    . losartan (COZAAR) 25 MG tablet Take 1 tablet (25 mg total) by mouth at bedtime. 90 tablet 3  .  meclizine (ANTIVERT) 25 MG tablet Take 1 tablet (25 mg total) by mouth 2 (two) times daily as needed for dizziness. 60 tablet 2  . metoprolol succinate (TOPROL-XL) 25 MG 24 hr tablet Take 1 tablet (25 mg total) by mouth 2 (two) times a day. Take with or immediately following a meal. 180 tablet 3  . omeprazole (PRILOSEC) 20 MG capsule Take 40 mg by mouth daily.    . sertraline (ZOLOFT) 50 MG tablet Take 50 mg by mouth daily.     . simvastatin (ZOCOR) 80 MG tablet Take 40 mg by mouth at bedtime.    . torsemide (DEMADEX) 20 MG tablet Take 1 tablet (20 mg total) by mouth daily. 90 tablet 3   No current facility-administered medications for this visit.    Allergies:   Patient has no known allergies.   Social History:  The patient  reports that he has quit smoking. He has never used smokeless tobacco. He reports previous alcohol use. He reports that he does not use drugs.   Family History:  The patient's family history includes Breast cancer in his mother; Stroke in his father.    ROS:  Please see the history of present illness.   Otherwise, review of systems is positive for none.   All other systems are reviewed and negative.   PHYSICAL EXAM: VS:  BP 122/72   Pulse 69   Ht 5\' 9"  (1.753 m)   Wt 192 lb (87.1 kg)   SpO2 98%   BMI 28.35 kg/m  , BMI Body mass index is 28.35 kg/m. GEN: Well nourished, well developed, in no acute distress  HEENT: normal  Neck: no JVD, carotid bruits, or masses Cardiac: RRR; no murmurs, rubs, or gallops,no edema  Respiratory:  clear to auscultation bilaterally, normal work of breathing GI: soft, nontender, nondistended, + BS MS: no deformity or atrophy  Skin: warm and dry, device site well healed Neuro:  Strength and sensation are intact Psych: euthymic mood, full affect  EKG:  EKG is ordered today. Personal review of the ekg ordered shows sinus rhythm, rate 69  Personal review of the device interrogation today. Results in Learned: 04/20/2019: B Natriuretic Peptide 2,049.6 06/18/2019: Magnesium 2.2 10/21/2019: ALT 30; BUN 23; Creatinine, Ser 1.78; Hemoglobin 13.6; Platelets 173; Potassium 4.2; Sodium 136; TSH 2.209    Lipid Panel  No results found for: CHOL, TRIG, HDL, CHOLHDL, VLDL, LDLCALC, LDLDIRECT   Wt Readings from Last 3 Encounters:  12/21/19 192 lb (87.1 kg)  10/21/19 190 lb 12.8 oz (86.5 kg)  10/14/19 187 lb 6.4 oz (85 kg)      Other studies Reviewed: Additional studies/ records that were reviewed today include: TEE 05/23/2019 Review of the above records today demonstrates:   1. The left ventricle has moderate-severely reduced systolic function, with an ejection fraction of 30-35%. The cavity size was normal. Left ventricular diffuse hypokinesis.  2. The right ventricle has mildly reduced systolic function. The cavity was mildly enlarged.  3. Moderate left atrial enlargement, smoke in LA but no LA appendage thrombus.  4. No evidence of mitral valve stenosis. Trivial  mitral regurgitation.  5. Trivial tricuspid regurgitation, peak RV-RA gradient 24 mmHg.  6. Normal caliber aorta with mild plaque in descending thoracic aorta.  7. Moderately dilated right atrium. No evidence for PFO or ASD by color doppler.   ASSESSMENT AND PLAN:  1.  Systolic heart failure due to nonischemic cardiomyopathy: Currently on optimal medical therapy with losartan, Toprol-XL, eplerenone.  Is status post Medtronic ICD.  Device is functioning appropriately.  No changes at this time.    2.  Persistent atrial fibrillation: Currently on amiodarone and Eliquis.  He is status post AF ablation 09/16/2019.  CHA2DS2-VASc of 2.  We Ally Knodel stop amiodarone today.  Aside from that, he is remained in sinus rhythm.  3.  Dizziness: Dizziness appears to be somewhat orthostatic.  Occurs when he is bending down, turns his head and stands up.  I have cautioned him against standing up too quickly, reminded him to remain hydrated, and told him to be  aware that he may need to grab onto something when he stands up.   Current medicines are reviewed at length with the patient today.   The patient does not have concerns regarding his medicines.  The following changes were made today: Stop amiodarone  Labs/ tests ordered today include:  Orders Placed This Encounter  Procedures  . EKG 12-Lead     Disposition:   FU with Megin Consalvo 6 months  Signed, Yeni Jiggetts Meredith Leeds, MD  12/21/2019 12:02 PM     [facility] [place] [address] [phone] (office) [phone] (fax)

## 2020-01-12 ENCOUNTER — Telehealth: Payer: Self-pay | Admitting: *Deleted

## 2020-01-12 NOTE — Telephone Encounter (Signed)
LMOVM for DC at [facility] requesting patient's release in [facility]. Transfer requested on 11/12/19.

## 2020-01-13 NOTE — Telephone Encounter (Signed)
[facility] RELEAS[facility] THE PT

## 2020-01-17 ENCOUNTER — Ambulatory Visit (INDEPENDENT_AMBULATORY_CARE_PROVIDER_SITE_OTHER): Payer: No Typology Code available for payment source | Admitting: *Deleted

## 2020-01-17 DIAGNOSIS — I4819 Other persistent atrial fibrillation: Secondary | ICD-10-CM

## 2020-01-17 LAB — CUP PACEART REMOTE DEVICE CHECK
Battery Remaining Longevity: 65 mo
Battery Voltage: 2.98 V
Brady Statistic RV Percent Paced: 0.01 %
Date Time Interrogation Session: 20210426054210
HighPow Impedance: 70 Ohm
Implantable Lead Implant Date: 20140818
Implantable Lead Location: 753860
Implantable Pulse Generator Implant Date: 20140818
Lead Channel Impedance Value: 323 Ohm
Lead Channel Impedance Value: 399 Ohm
Lead Channel Pacing Threshold Amplitude: 0.625 V
Lead Channel Pacing Threshold Pulse Width: 0.4 ms
Lead Channel Sensing Intrinsic Amplitude: 14.625 mV
Lead Channel Sensing Intrinsic Amplitude: 14.625 mV
Lead Channel Setting Pacing Amplitude: 2 V
Lead Channel Setting Pacing Pulse Width: 0.4 ms
Lead Channel Setting Sensing Sensitivity: 0.3 mV

## 2020-01-17 NOTE — Progress Notes (Signed)
ICD Remote  

## 2020-01-19 ENCOUNTER — Telehealth (HOSPITAL_COMMUNITY): Payer: Self-pay

## 2020-01-19 NOTE — Telephone Encounter (Signed)
Please reach out to [place] to see if we can get approval for patient to continue to see [place]. Patient is suppose to have a echo 4/30

## 2020-01-20 NOTE — Telephone Encounter (Signed)
[facility] sent 01/19/20

## 2020-01-21 ENCOUNTER — Ambulatory Visit (HOSPITAL_COMMUNITY)
Admission: RE | Admit: 2020-01-21 | Discharge: 2020-01-21 | Disposition: A | Discharge status: Home / Self Care | Payer: No Typology Code available for payment source | Source: Ambulatory Visit | Attending: Cardiology | Admitting: Cardiology

## 2020-01-21 ENCOUNTER — Ambulatory Visit (HOSPITAL_BASED_OUTPATIENT_CLINIC_OR_DEPARTMENT_OTHER)
Admission: RE | Admit: 2020-01-21 | Discharge: 2020-01-21 | Disposition: A | Discharge status: Home / Self Care | Payer: No Typology Code available for payment source | Source: Ambulatory Visit | Attending: Cardiology | Admitting: Cardiology

## 2020-01-21 ENCOUNTER — Other Ambulatory Visit: Payer: Self-pay

## 2020-01-21 ENCOUNTER — Encounter (HOSPITAL_COMMUNITY): Payer: Self-pay | Admitting: Cardiology

## 2020-01-21 VITALS — BP 100/58 | HR 73 | Wt 193.6 lb

## 2020-01-21 DIAGNOSIS — Z87891 Personal history of nicotine dependence: Secondary | ICD-10-CM | POA: Insufficient documentation

## 2020-01-21 DIAGNOSIS — I5022 Chronic systolic (congestive) heart failure: Secondary | ICD-10-CM | POA: Insufficient documentation

## 2020-01-21 DIAGNOSIS — I4891 Unspecified atrial fibrillation: Secondary | ICD-10-CM | POA: Insufficient documentation

## 2020-01-21 DIAGNOSIS — I428 Other cardiomyopathies: Secondary | ICD-10-CM | POA: Insufficient documentation

## 2020-01-21 DIAGNOSIS — Z8572 Personal history of non-Hodgkin lymphomas: Secondary | ICD-10-CM | POA: Insufficient documentation

## 2020-01-21 DIAGNOSIS — Z95810 Presence of automatic (implantable) cardiac defibrillator: Secondary | ICD-10-CM | POA: Insufficient documentation

## 2020-01-21 DIAGNOSIS — Z7901 Long term (current) use of anticoagulants: Secondary | ICD-10-CM | POA: Insufficient documentation

## 2020-01-21 DIAGNOSIS — G4733 Obstructive sleep apnea (adult) (pediatric): Secondary | ICD-10-CM | POA: Insufficient documentation

## 2020-01-21 DIAGNOSIS — Z79899 Other long term (current) drug therapy: Secondary | ICD-10-CM | POA: Insufficient documentation

## 2020-01-21 DIAGNOSIS — N183 Chronic kidney disease, stage 3 unspecified: Secondary | ICD-10-CM | POA: Insufficient documentation

## 2020-01-21 LAB — DIGOXIN LEVEL: Digoxin Level: 0.8 ng/mL (ref 0.8–2.0)

## 2020-01-21 LAB — BASIC METABOLIC PANEL
Anion gap: 13 (ref 5–15)
BUN: 22 mg/dL (ref 8–23)
CO2: 27 mmol/L (ref 22–32)
Calcium: 9.5 mg/dL (ref 8.9–10.3)
Chloride: 98 mmol/L (ref 98–111)
Creatinine, Ser: 1.79 mg/dL — ABNORMAL HIGH (ref 0.61–1.24)
GFR calc Af Amer: 44 mL/min — ABNORMAL LOW (ref >60)
GFR calc non Af Amer: 38 mL/min — ABNORMAL LOW (ref >60)
Glucose, Bld: 127 mg/dL — ABNORMAL HIGH (ref 70–99)
Potassium: 3.9 mmol/L (ref 3.5–5.1)
Sodium: 138 mmol/L (ref 135–145)

## 2020-01-21 NOTE — Patient Instructions (Signed)
STOP Digoxin   STOP Torsemide   Labs today We will only contact you if something comes back abnormal or we need to make some changes. Otherwise no news is good news!  Your physician wants you to follow-up in: 6 months. You will receive a reminder letter in the mail two months in advance. If you don't receive a letter, please call our office to schedule the follow-up appointment.   Please call office at [phone] option 2 if you have any questions or concerns.    At the [facility], you and your health needs are our priority. As part of our continuing mission to provide you with exceptional heart care, we have created designated Provider Care Teams. These Care Teams include your primary Cardiologist (physician) and Advanced Practice Providers (APPs- Physician Assistants and Nurse Practitioners) who all work together to provide you with the care you need, when you need it.   You may see any of the following providers on your designated Care Team at your next follow up: Marland Kitchen Dr Glori Bickers . Dr [name] . Darrick Grinder, NP . Lyda Jester, PA . Audry Riles, PharmD   Please be sure to bring in all your medications bottles to every appointment.

## 2020-01-21 NOTE — Progress Notes (Signed)
  Echocardiogram 2D Echocardiogram has been performed.  Jon Oconnell 01/21/2020, 11:30 AM

## 2020-01-23 NOTE — Progress Notes (Signed)
PCP: [facility] [place] [facility]: Dr. Stanford Breed HF Cardiology: Dr. [name]  69 y.o. with history of nonischemic cardiomyopathy, prior non-Hodgkins lymphoma, and atrial fibrillation returns for followup of CHF and atrial fibrillation.    Patient has a long-standing history of CHF.  In 2014, EF was 30-35%.  Cath at that time showed no obstructive CAD.  He has a Medtronic ICD. He recently moved to [place]. In 7/20, he was seen at the [facility] in [place] due to worsening dyspnea and palpitations.  He was found to be in atrial fibrillation with RVR.  He was started on Eliquis and TEE-guided DCCV was planned, but TEE showed LA thrombus so he did not have the cardioversion.  The TEE showed EF 25-30% with diffuse hypokinesis, normal RV, mild MR, moderate TR.  He was discharged home on Lasix 20 mg daily. Since then, he continued to be short of breath with exertion.    He was then [facility] to [facility] with decompensated CHF.  He was diuresed.  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    He had TEE-guided DCCV to NSR in 8/20.  TEE showed EF 30-35% with no LA appendage thrombus.   He had an atrial fibrillation ablation in 12/20.   Echo was done today and reviewed, EF up to 55%.   He remains in NSR today, no palpitations.  Feels good, no lightheadedness.  No significant exertional dyspnea.  No chest pain.    ECG (personally reviewed): NSR, 1st degree AV block, LAFB  Medtronic device interrogation: Fluid index < threshold, stable thoracic impedance  Labs (8/20): K 4, creatinine 1.7 => 1.48 Labs (9/20): K 4.6, creatinine 1.27, LFTs normal, TSH normal, digoxin level 0.9, hgb 14 Labs (11/20): digoxin 0.4 Labs (12/20): K 3.9, creatinine 1.64 Labs (1/21): K 4.2, creatinine 1.78, digoxin 0.8, TSH normal, LFTs normal, hgb 13.6  Past Medical History: 1.  Non-Hodgkins lymphoma: Remote, in remission.  2. Atrial fibrillation: First noted in 7/20.  TEE in 7/20 showed left atrial appendage thrombus.  - DCCV to NSR in 8/20.   - Atrial fibrillation ablation in 12/20 3. Chronic systolic CHF: Nonischemic cardiomyopathy.  Echo in 2014 with EF 30-35%, cath at that time showed no significant CAD.  - He has a Medtronic ICD.  - TEE (7/20): EF 25-30% with diffuse hypokinesis, normal RV, moderate TR, mild MR.  There was a LA appendage thrombus.  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  Mean RA 3, PA 34/14, mean PCWP 10, CI 1.81.  - TEE (8/20): EF 30-35%, mildly decreased RV systolic function, no LA appendage thrombus.  - Echo (4/21): EF 55%, normal RV, PASP 31 mmHg.  4. CKD: Stage 3.  5. OSA: Not using CPAP.  6. Syncope (11/20): Suspect dehydrated, taking both furosemide and torsemide.   Social History   Socioeconomic History  . Marital status: Married    Spouse name: Not on file  . Number of children: Not on file  . Years of education: Not on file  . Highest education level: Not on file  Occupational History  . Not on file  Tobacco Use  . Smoking status: Former Research scientist (life sciences)  . Smokeless tobacco: Never Used  Substance and Sexual Activity  . Alcohol use: Not Currently  . Drug use: Never  .  Sexual activity: Not Currently  Other Topics Concern  . Not on file  Social History Narrative  . Not on file   Social Determinants of Health   Financial Resource Strain:   . Difficulty of Paying Living Expenses:   Food Insecurity:   . Worried About Charity fundraiser in the Last Year:   . Arboriculturist in the Last Year:   Transportation Needs:   . Film/video editor (Medical):   Marland Kitchen Lack of Transportation (Non-Medical):   Physical Activity:   . Days of Exercise per Week:   . Minutes of Exercise per Session:   Stress:   . Feeling of Stress  :   Social Connections:   . Frequency of Communication with Friends and Family:   . Frequency of Social Gatherings with Friends and Family:   . Attends Religious Services:   . Active Member of Clubs or Organizations:   . Attends Archivist Meetings:   Marland Kitchen Marital Status:   Intimate Partner Violence:   . Fear of Current or Ex-Partner:   . Emotionally Abused:   Marland Kitchen Physically Abused:   . Sexually Abused:    Family History  Problem Relation Age of Onset  . Breast cancer Mother   . Stroke Father    ROS: All systems reviewed and negative except as per HPI.   Current Outpatient Medications  Medication Sig Dispense Refill  . apixaban (ELIQUIS) 5 MG TABS tablet Take 1 tablet (5 mg total) by mouth 2 (two) times daily. 60 tablet 5  . eplerenone (INSPRA) 25 MG tablet Take 1 tablet (25 mg total) by mouth daily. 90 tablet 3  . ipratropium (ATROVENT) 0.02 % nebulizer solution Take 0.5 mg by nebulization every 6 (six) hours as needed for wheezing or shortness of breath.    . losartan (COZAAR) 25 MG tablet Take 1 tablet (25 mg total) by mouth at bedtime. 90 tablet 3  . meclizine (ANTIVERT) 25 MG tablet Take 1 tablet (25 mg total) by mouth 2 (two) times daily as needed for dizziness. 60 tablet 2  . metoprolol succinate (TOPROL-XL) 25 MG 24 hr tablet Take 1 tablet (25 mg total) by mouth 2 (two) times a day. Take with or immediately following a meal. 180 tablet 3  . omeprazole (PRILOSEC) 20 MG capsule Take 40 mg by mouth daily.    . sertraline (ZOLOFT) 50 MG tablet Take 50 mg by mouth daily.     . simvastatin (ZOCOR) 80 MG tablet Take 40 mg by mouth at bedtime.     No current facility-administered medications for this encounter.   BP (!) 100/58   Pulse 73   Wt 87.8 kg (193 lb 9.6 oz)   SpO2 95%   BMI 28.59 kg/m  General: NAD Neck: No JVD, no thyromegaly or thyroid nodule.  Lungs: Clear to auscultation bilaterally with normal respiratory effort. CV: Nondisplaced PMI.  Heart regular  S1/S2, no S3/S4, no murmur.  No peripheral edema.  No carotid bruit.  Normal pedal pulses.  Abdomen: Soft, nontender, no hepatosplenomegaly, no distention.  Skin: Intact without lesions or rashes.  Neurologic: Alert and oriented x 3.  Psych: Normal affect. Extremities: No clubbing or cyanosis.  HEENT: Normal.   Assessment/Plan: 1. Chronic systolic CHF: Patient has had a nonischemic cardiomyopathy since at least 2014, at that time echo showed EF 30-35%. 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 Medtronic ICD. Possible cardiomyopathy related to chemotherapy received during  treatment for non-Hodgkins lymphoma. HIV negative 7/20. TEE in [place] in 7/20 showed EF 25-30% with diffuse hypokinesis, normal RV, mild MR, moderate TR. 7/20 CHF exacerbation appears to have been triggered by new-onset atrial fibrillation. RHC in 8/20 showed normal filling pressures but low cardiac output.  He had TEE-guided DCCV in 8/20 (TEE showed EF 30-35%) then atrial fibrillation ablation in 12/20, and he remains in NSR today.  Echo was done today and reviewed, EF up to 55%. NYHA class I-II symptoms per his report.  Not volume overloaded on exam or by Optivol.   - I think that we can stop torsemide.    - Stop digoxin with normalized EF.    - Continue Toprol XL 25 mg bid.  - Continue eplerenone 25 mg daily.  - No further orthostatic symptoms, continue losartan 25 mg qhs.  2. Atrial fibrillation: S/p atrial fibrillation ablation in 12/20, he is in NSR today.  He is now off amiodarone.  - Continue Eliquis.   3. CKD stage 3: BMET today.  4. 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  It would be helpful to do a coronary CTA to assess the courses of the LAD/LCx (?malignant interarterial).  However, he is 40 and does not appear to have had an arrhythmia or chest pain related to anomalous coronaries so would be very  unlikely to recommend CABG, etc.    Followup in 6 months  [name] 01/23/2020

## 2020-04-17 ENCOUNTER — Ambulatory Visit: Payer: No Typology Code available for payment source

## 2020-05-05 ENCOUNTER — Ambulatory Visit (INDEPENDENT_AMBULATORY_CARE_PROVIDER_SITE_OTHER): Payer: No Typology Code available for payment source | Admitting: *Deleted

## 2020-05-05 DIAGNOSIS — I5022 Chronic systolic (congestive) heart failure: Secondary | ICD-10-CM

## 2020-05-07 LAB — CUP PACEART REMOTE DEVICE CHECK
Battery Remaining Longevity: 49 mo
Battery Voltage: 2.98 V
Brady Statistic RV Percent Paced: 0.01 %
Date Time Interrogation Session: 20210814002521
HighPow Impedance: 75 Ohm
Implantable Lead Implant Date: 20140818
Implantable Lead Location: 753860
Implantable Pulse Generator Implant Date: 20140818
Lead Channel Impedance Value: 323 Ohm
Lead Channel Impedance Value: 399 Ohm
Lead Channel Pacing Threshold Amplitude: 0.75 V
Lead Channel Pacing Threshold Pulse Width: 0.4 ms
Lead Channel Sensing Intrinsic Amplitude: 13.875 mV
Lead Channel Sensing Intrinsic Amplitude: 13.875 mV
Lead Channel Setting Pacing Amplitude: 2 V
Lead Channel Setting Pacing Pulse Width: 0.4 ms
Lead Channel Setting Sensing Sensitivity: 0.3 mV

## 2020-05-08 NOTE — Progress Notes (Signed)
Remote ICD transmission.   

## 2020-05-26 ENCOUNTER — Telehealth: Payer: Self-pay | Admitting: Cardiology
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
## 2020-06-06 ENCOUNTER — Ambulatory Visit (INDEPENDENT_AMBULATORY_CARE_PROVIDER_SITE_OTHER): Payer: Self-pay | Admitting: Cardiology

## 2020-06-06 ENCOUNTER — Other Ambulatory Visit: Payer: Self-pay

## 2020-06-06 ENCOUNTER — Encounter: Payer: Self-pay | Admitting: Cardiology

## 2020-06-06 VITALS — BP 80/47 | HR 80 | Ht 69.0 in | Wt 200.0 lb

## 2020-06-06 DIAGNOSIS — Z95810 Presence of automatic (implantable) cardiac defibrillator: Secondary | ICD-10-CM

## 2020-06-06 DIAGNOSIS — I428 Other cardiomyopathies: Secondary | ICD-10-CM

## 2020-06-06 DIAGNOSIS — I4819 Other persistent atrial fibrillation: Secondary | ICD-10-CM
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
## 2020-06-06 NOTE — Progress Notes (Signed)
Heather: With dizzy spells and syncope, can we get him in to see me or NP/PA soon please? Dr. Curt Bears as adjusted his meds.

## 2020-06-06 NOTE — Patient Instructions (Signed)
Medication Instructions:  Your physician has recommended you make the following change in your medication:  1. STOP Losartan 2. STOP Metoprolol Succinate (Toprol)  *If you need a refill on your cardiac medications before your next appointment, please call your pharmacy*   Lab Work: None ordered    Testing/Procedures: None ordered   Follow-Up: At [facility], you and your health needs are our priority.  As part of our continuing mission to provide you with exceptional heart care, we have created designated Provider Care Teams.  These Care Teams include your primary Cardiologist (physician) and Advanced Practice Providers (APPs -  Physician Assistants and Nurse Practitioners) who all work together to provide you with the care you need, when you need it.  We recommend signing up for the patient portal called "MyChart".  Sign up information is provided on this After Visit Summary.  MyChart is used to connect with patients for Virtual Visits (Telemedicine).  Patients are able to view lab/test results, encounter notes, upcoming appointments, etc.  Non-urgent messages can be sent to your provider as well.   To learn more about what you can do with MyChart, go to [email or web address].    Your next appointment:   6 month(s)  The format for your next appointment:   In Person  Provider:   Allegra Lai, MD   Thank you for choosing [facility] [place]!!   Trinidad Curet, RN [phone]    Other Instructions

## 2020-06-06 NOTE — Addendum Note (Signed)
Addended by: Stanton Kidney on: 06/06/2020 11:12 AM   Modules accepted: Orders

## 2020-06-07 NOTE — Progress Notes (Signed)
lmtrc

## 2020-06-08 ENCOUNTER — Telehealth (HOSPITAL_COMMUNITY): Payer: Self-pay | Admitting: Vascular Surgery

## 2020-06-08 NOTE — Telephone Encounter (Signed)
Left pt message giving appt w/ appt  06/20/20 @ 1:30 PM, asked pt to call back to confirm

## 2020-06-20 ENCOUNTER — Ambulatory Visit (HOSPITAL_COMMUNITY)
Admission: RE | Admit: 2020-06-20 | Discharge: 2020-06-20 | Disposition: A | Discharge status: Home / Self Care | Payer: Self-pay | Source: Ambulatory Visit | Attending: Cardiology | Admitting: Cardiology

## 2020-06-20 ENCOUNTER — Other Ambulatory Visit: Payer: Self-pay

## 2020-06-20 ENCOUNTER — Encounter (HOSPITAL_COMMUNITY): Payer: Self-pay

## 2020-06-20 VITALS — BP 115/85 | HR 81 | Wt 202.8 lb

## 2020-06-20 DIAGNOSIS — I5022 Chronic systolic (congestive) heart failure: Secondary | ICD-10-CM | POA: Insufficient documentation

## 2020-06-20 DIAGNOSIS — Z9119 Patient's noncompliance with other medical treatment and regimen: Secondary | ICD-10-CM | POA: Insufficient documentation

## 2020-06-20 DIAGNOSIS — G4733 Obstructive sleep apnea (adult) (pediatric): Secondary | ICD-10-CM | POA: Insufficient documentation

## 2020-06-20 DIAGNOSIS — I4891 Unspecified atrial fibrillation: Secondary | ICD-10-CM | POA: Insufficient documentation

## 2020-06-20 DIAGNOSIS — N183 Chronic kidney disease, stage 3 unspecified: Secondary | ICD-10-CM | POA: Insufficient documentation

## 2020-06-20 DIAGNOSIS — Z95810 Presence of automatic (implantable) cardiac defibrillator: Secondary | ICD-10-CM | POA: Insufficient documentation

## 2020-06-20 DIAGNOSIS — Z87891 Personal history of nicotine dependence: Secondary | ICD-10-CM | POA: Insufficient documentation

## 2020-06-20 DIAGNOSIS — Z8572 Personal history of non-Hodgkin lymphomas: Secondary | ICD-10-CM | POA: Insufficient documentation

## 2020-06-20 DIAGNOSIS — Z7901 Long term (current) use of anticoagulants: Secondary | ICD-10-CM | POA: Insufficient documentation

## 2020-06-20 DIAGNOSIS — I428 Other cardiomyopathies: Secondary | ICD-10-CM | POA: Insufficient documentation

## 2020-06-20 DIAGNOSIS — I2510 Atherosclerotic heart disease of native coronary artery without angina pectoris: Secondary | ICD-10-CM | POA: Insufficient documentation

## 2020-06-20 LAB — BASIC METABOLIC PANEL
Anion gap: 9 (ref 5–15)
BUN: 11 mg/dL (ref 8–23)
CO2: 23 mmol/L (ref 22–32)
Calcium: 9.3 mg/dL (ref 8.9–10.3)
Chloride: 104 mmol/L (ref 98–111)
Creatinine, Ser: 1.24 mg/dL (ref 0.61–1.24)
GFR calc Af Amer: >60 mL/min (ref >60)
GFR calc non Af Amer: 59 mL/min — ABNORMAL LOW (ref >60)
Glucose, Bld: 88 mg/dL (ref 70–99)
Potassium: 4.2 mmol/L (ref 3.5–5.1)
Sodium: 136 mmol/L (ref 135–145)

## 2020-06-20 LAB — CBC
HCT: 41.3 % (ref 39.0–52.0)
Hemoglobin: 13.1 g/dL (ref 13.0–17.0)
MCH: 27 pg (ref 26.0–34.0)
MCHC: 31.7 g/dL (ref 30.0–36.0)
MCV: 85.2 fL (ref 80.0–100.0)
Platelets: 154 10*3/uL (ref 150–400)
RBC: 4.85 MIL/uL (ref 4.22–5.81)
RDW: 14.5 % (ref 11.5–15.5)
WBC: 8 10*3/uL (ref 4.0–10.5)
nRBC: 0 % (ref 0.0–0.2)

## 2020-06-20 LAB — BRAIN NATRIURETIC PEPTIDE: B Natriuretic Peptide: 250.4 pg/mL — ABNORMAL HIGH (ref 0.0–100.0)

## 2020-06-20 MED ORDER — FUROSEMIDE 20 MG PO TABS: ORAL_TABLET | ORAL | 11 refills | Status: DC

## 2020-06-20 MED ORDER — TORSEMIDE 20 MG PO TABS: ORAL_TABLET | ORAL | 3 refills | Status: DC

## 2020-06-20 NOTE — Patient Instructions (Signed)
TAKE Torsemide 20 mg daily for 2 days, then as needed for weight gain or swelling  Labs today We will only contact you if something comes back abnormal or we need to make some changes. Otherwise no news is good news!  Your physician recommends that you schedule a follow-up appointment in: 8 weeks  in the Advanced Practitioners (PA/NP) Clinic    If you have any questions or concerns before your next appointment please send [place] a message through [place] or call our office at [phone].    TO LEAVE A MESSAGE FOR THE NURSE SELECT OPTION 2, PLEASE LEAVE A MESSAGE INCLUDING: . YOUR NAME . DATE OF BIRTH . CALL BACK NUMBER . REASON FOR CALL**this is important as we prioritize the call backs  YOU WILL RECEIVE A CALL BACK THE SAME DAY AS LONG AS YOU CALL BEFORE 4:00 PM
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
## 2020-07-03 ENCOUNTER — Telehealth: Payer: Self-pay | Admitting: Cardiology

## 2020-07-03 NOTE — Telephone Encounter (Signed)
Received a fax from [facility] for surgical clearance. The writing on the clearance is very illegible  and cannot read it. I have contacted the requesting surgeon's office and left a detailed message for them to either refax a legible copy or they can call it in.

## 2020-07-03 NOTE — Telephone Encounter (Signed)
Called and informed pt that we have not received faxed cardiac clearance. He will call and have them fax request.

## 2020-07-03 NOTE — Telephone Encounter (Signed)
Follow Up:   Pt is caling to check on his clearance that was sent on 06-09-20.

## 2020-07-04 NOTE — Telephone Encounter (Signed)
Pt returning call .    Best number [phone]

## 2020-07-04 NOTE — Telephone Encounter (Signed)
Patient with diagnosis of A Fib on Eliquis for anticoagulation.    Procedure: 17 extractions Date of procedure: TBD  CHADS2-VASc score of  4 (CHF,  stroke/tia x 2, AGE)  CrCl 62.11 mL/min using adjusted body weight Platelet count 154K  Per office protocol, patient can hold Eliquis for 2-3 days prior to procedure.    Patient  will not need bridging with Lovenox (enoxaparin) around procedure.

## 2020-07-04 NOTE — Telephone Encounter (Signed)
1. What dental office are you calling from? [facility]  2. What is your office phone number? [phone]  3. What is your fax number?[phone]  4. What type of procedure is the patient having performed?  17 extractions  5. What date is procedure scheduled or is the patient there now? TBD  (if the patient is at the dentist's office question goes to their cardiologist if he/she is in the office.  If not, question should go to the DOD).   6. What is your question (ex. Antibiotics prior to procedure, holding medication-we need to know how long dentist wants pt to hold med)? Do to health conditions if local or IV sedation is appropriate.

## 2020-07-04 NOTE — Telephone Encounter (Signed)
° °  Primary Cardiologist: Mertie Moores, MD  Chart reviewed as part of pre-operative protocol coverage. Given past medical history and time since last visit, based on ACC/AHA guidelines, Ermal Haberer would be at acceptable risk for the planned procedure without further cardiovascular testing.   Patient with diagnosis of A Fib on Eliquis for anticoagulation.    Procedure: 17 extractions Date of procedure: TBD  CHADS2-VASc score of  4 (CHF,  stroke/tia x 2, AGE)  CrCl 62.11 mL/min using adjusted body weight Platelet count 154K  Per office protocol, patient can hold Eliquis for 2-3 days prior to procedure.    Patient  will not need bridging with Lovenox (enoxaparin) around procedure.  Patient was advised that if he develops new symptoms prior to surgery to contact our office to arrange a follow-up appointment.  He verbalized understanding.  I will route this recommendation to the requesting party via Epic fax function and remove from pre-op pool.  Please call with questions.  Jossie Ng. Eeshan Verbrugge NP-C    07/04/2020, 1:55 PM [address] Office [phone] Fax [phone]

## 2020-07-04 NOTE — Telephone Encounter (Signed)
No cardiac complaints at this time patient blood pressure trending much better in the 100s over 80s.

## 2020-07-11 ENCOUNTER — Telehealth: Payer: Self-pay

## 2020-07-11 NOTE — Telephone Encounter (Signed)
Unscheduled manual transmission received, no clear indication of why.  Spoke with pt.  He says he sent transmission to see if machine was working.  Advised pt that we receive transmissions on schedule and if a transmission is not received whenexpected we will contact him, he does not need to send transmission nless we request it.  He denies any current symptoms.   Transmission does show elevated HR 115bpm on presenting.  Pt confirms he was active just prior to sending transmission.  It also show elevated optivol level.  Pt does not feel he is retaining fluid and denies any new/ increased SOB or cough.  Pt reports he recently stopped taking Torsemide at instruction of [facility] clinic.  Per med list it was stopped as of 10/2.    D/w pt ICM clinic which he would like to participate in.  Advised I would also forward info to Dr. [name] since it seems like he may need something for fluid management.

## 2020-07-14 NOTE — Telephone Encounter (Signed)
Attempted ICM call for enrollment and introduction.  Left message with ICM number for return call.

## 2020-08-01 NOTE — Telephone Encounter (Signed)
Attempted ICM Intro call to patient and no answer.  Unable to reach patient for ICM enrollment at this time.  91 day device remote monitoring will continue per protocol.

## 2020-08-04 ENCOUNTER — Ambulatory Visit (INDEPENDENT_AMBULATORY_CARE_PROVIDER_SITE_OTHER): Payer: Self-pay

## 2020-08-04 DIAGNOSIS — I4819 Other persistent atrial fibrillation: Secondary | ICD-10-CM

## 2020-08-06 LAB — CUP PACEART REMOTE DEVICE CHECK
Battery Remaining Longevity: 43 mo
Battery Voltage: 2.98 V
Brady Statistic RV Percent Paced: 0 %
Date Time Interrogation Session: 20211113104223
HighPow Impedance: 75 Ohm
Implantable Lead Implant Date: 20140818
Implantable Lead Location: 753860
Implantable Pulse Generator Implant Date: 20140818
Lead Channel Impedance Value: 323 Ohm
Lead Channel Impedance Value: 399 Ohm
Lead Channel Pacing Threshold Amplitude: 0.625 V
Lead Channel Pacing Threshold Pulse Width: 0.4 ms
Lead Channel Sensing Intrinsic Amplitude: 12 mV
Lead Channel Sensing Intrinsic Amplitude: 12 mV
Lead Channel Setting Pacing Amplitude: 2 V
Lead Channel Setting Pacing Pulse Width: 0.4 ms
Lead Channel Setting Sensing Sensitivity: 0.3 mV

## 2020-08-07 NOTE — Progress Notes (Signed)
Remote ICD transmission.   

## 2020-08-08 ENCOUNTER — Other Ambulatory Visit: Payer: Self-pay | Admitting: Cardiology
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
## 2020-08-21 ENCOUNTER — Ambulatory Visit (HOSPITAL_COMMUNITY)
Admission: RE | Admit: 2020-08-21 | Discharge: 2020-08-21 | Disposition: A | Discharge status: Home / Self Care | Payer: Self-pay | Source: Ambulatory Visit | Attending: Adult Health | Admitting: Adult Health

## 2020-08-21 ENCOUNTER — Other Ambulatory Visit: Payer: Self-pay

## 2020-08-21 ENCOUNTER — Encounter (HOSPITAL_COMMUNITY): Payer: Self-pay

## 2020-08-21 VITALS — BP 108/74 | HR 85 | Wt 202.2 lb

## 2020-08-21 DIAGNOSIS — Z95810 Presence of automatic (implantable) cardiac defibrillator: Secondary | ICD-10-CM | POA: Insufficient documentation

## 2020-08-21 DIAGNOSIS — I480 Paroxysmal atrial fibrillation: Secondary | ICD-10-CM

## 2020-08-21 DIAGNOSIS — Z79899 Other long term (current) drug therapy: Secondary | ICD-10-CM | POA: Insufficient documentation

## 2020-08-21 DIAGNOSIS — I5022 Chronic systolic (congestive) heart failure: Secondary | ICD-10-CM | POA: Insufficient documentation

## 2020-08-21 DIAGNOSIS — Z7901 Long term (current) use of anticoagulants: Secondary | ICD-10-CM | POA: Insufficient documentation

## 2020-08-21 DIAGNOSIS — I428 Other cardiomyopathies: Secondary | ICD-10-CM | POA: Insufficient documentation

## 2020-08-21 DIAGNOSIS — N1831 Chronic kidney disease, stage 3a: Secondary | ICD-10-CM

## 2020-08-21 DIAGNOSIS — I4891 Unspecified atrial fibrillation: Secondary | ICD-10-CM | POA: Insufficient documentation

## 2020-08-21 DIAGNOSIS — Z8572 Personal history of non-Hodgkin lymphomas: Secondary | ICD-10-CM | POA: Insufficient documentation

## 2020-08-21 DIAGNOSIS — Z87891 Personal history of nicotine dependence: Secondary | ICD-10-CM | POA: Insufficient documentation

## 2020-08-21 DIAGNOSIS — N183 Chronic kidney disease, stage 3 unspecified: Secondary | ICD-10-CM | POA: Insufficient documentation

## 2020-08-21 LAB — BASIC METABOLIC PANEL
Anion gap: 9 (ref 5–15)
BUN: 12 mg/dL (ref 8–23)
CO2: 24 mmol/L (ref 22–32)
Calcium: 9.3 mg/dL (ref 8.9–10.3)
Chloride: 104 mmol/L (ref 98–111)
Creatinine, Ser: 1.28 mg/dL — ABNORMAL HIGH (ref 0.61–1.24)
GFR, Estimated: >60 mL/min (ref >60)
Glucose, Bld: 91 mg/dL (ref 70–99)
Potassium: 3.9 mmol/L (ref 3.5–5.1)
Sodium: 137 mmol/L (ref 135–145)

## 2020-08-21 NOTE — Patient Instructions (Signed)
Labs done today, your results will be available in MyChart, we will contact you for abnormal readings.  Your physician recommends that you schedule a follow-up appointment in: 3 months with Dr. [name]  If you have any questions or concerns before your next appointment please send [place] a message through [facility] or call our office at [phone].    TO LEAVE A MESSAGE FOR THE NURSE SELECT OPTION 2, PLEASE LEAVE A MESSAGE INCLUDING: . YOUR NAME . DATE OF BIRTH . CALL BACK NUMBER . REASON FOR CALL**this is important as we prioritize the call backs  YOU WILL RECEIVE A CALL BACK THE SAME DAY AS LONG AS YOU CALL BEFORE 4:00 PM

## 2020-08-21 NOTE — Progress Notes (Signed)
ReDS Vest / Clip - 08/21/20 1000      ReDS Vest / Clip   Station Marker D    Ruler Value 35    ReDS Value Range Low volume    ReDS Actual Value 35    Anatomical Comments sitting

## 2020-10-21 ENCOUNTER — Other Ambulatory Visit: Payer: Self-pay | Admitting: Internal Medicine

## 2020-11-03 ENCOUNTER — Ambulatory Visit (INDEPENDENT_AMBULATORY_CARE_PROVIDER_SITE_OTHER): Payer: No Typology Code available for payment source

## 2020-11-03 DIAGNOSIS — I428 Other cardiomyopathies: Secondary | ICD-10-CM

## 2020-11-03 LAB — CUP PACEART REMOTE DEVICE CHECK
Battery Remaining Longevity: 38 mo
Battery Voltage: 2.98 V
Brady Statistic RV Percent Paced: 0 %
Date Time Interrogation Session: 20220211151806
HighPow Impedance: 65 Ohm
Implantable Lead Implant Date: 20140818
Implantable Lead Location: 753860
Implantable Pulse Generator Implant Date: 20140818
Lead Channel Impedance Value: 323 Ohm
Lead Channel Impedance Value: 399 Ohm
Lead Channel Pacing Threshold Amplitude: 0.75 V
Lead Channel Pacing Threshold Pulse Width: 0.4 ms
Lead Channel Sensing Intrinsic Amplitude: 11.5 mV
Lead Channel Sensing Intrinsic Amplitude: 11.5 mV
Lead Channel Setting Pacing Amplitude: 2 V
Lead Channel Setting Pacing Pulse Width: 0.4 ms
Lead Channel Setting Sensing Sensitivity: 0.3 mV

## 2020-11-08 NOTE — Progress Notes (Signed)
Remote ICD transmission.   

## 2020-11-21 ENCOUNTER — Encounter (HOSPITAL_COMMUNITY): Payer: Self-pay | Admitting: Cardiology

## 2020-12-08 ENCOUNTER — Ambulatory Visit: Payer: Self-pay | Admitting: Cardiovascular Disease

## 2020-12-11 ENCOUNTER — Telehealth: Payer: Self-pay

## 2020-12-11 NOTE — Telephone Encounter (Signed)
Carelink alert received for increased fluid retention as diagnosed by OptiVol. Spoke with patient who denies increased lower extremity edema, shortness of breath, or chest discomfort. Patient states he may have extra abdominal fluid retention however denies weight gain of 5 lbs in 1 week or 3 lbs overnight. CHF action plan reviewed including sodium restrictions, daily weight adherence, and medication compliance. Patient states he has "a little white fluid pill" prescribed by [place] that he "probably should take". Patient encouraged to follow CHF action plan and take diuretic as indicated. Confirmed patient aware of CHF clinic appointment tomorrow 12/12/20 at 12:00 with Dr. [name] and with Dr. Curt Bears at 2:30. Patient encouraged to bring all VA prescribed medications to appointments as diuretics are not on patient current profile. Reviewed green, yellow, and red HF zones and when to seek medical care. Patient verbalized understanding.

## 2020-12-12 ENCOUNTER — Encounter: Payer: Self-pay | Admitting: Cardiology

## 2020-12-12 ENCOUNTER — Encounter (HOSPITAL_COMMUNITY): Payer: Self-pay | Admitting: Cardiology

## 2020-12-12 ENCOUNTER — Ambulatory Visit (HOSPITAL_COMMUNITY)
Admission: RE | Admit: 2020-12-12 | Discharge: 2020-12-12 | Disposition: A | Discharge status: Home / Self Care | Payer: Self-pay | Source: Ambulatory Visit | Attending: Cardiology | Admitting: Cardiology

## 2020-12-12 ENCOUNTER — Other Ambulatory Visit: Payer: Self-pay

## 2020-12-12 VITALS — BP 108/78 | HR 96 | Wt 197.2 lb

## 2020-12-12 DIAGNOSIS — N183 Chronic kidney disease, stage 3 unspecified: Secondary | ICD-10-CM | POA: Insufficient documentation

## 2020-12-12 DIAGNOSIS — Z8572 Personal history of non-Hodgkin lymphomas: Secondary | ICD-10-CM | POA: Insufficient documentation

## 2020-12-12 DIAGNOSIS — Z87891 Personal history of nicotine dependence: Secondary | ICD-10-CM | POA: Insufficient documentation

## 2020-12-12 DIAGNOSIS — Z95810 Presence of automatic (implantable) cardiac defibrillator: Secondary | ICD-10-CM | POA: Insufficient documentation

## 2020-12-12 DIAGNOSIS — G4733 Obstructive sleep apnea (adult) (pediatric): Secondary | ICD-10-CM | POA: Insufficient documentation

## 2020-12-12 DIAGNOSIS — I480 Paroxysmal atrial fibrillation: Secondary | ICD-10-CM

## 2020-12-12 DIAGNOSIS — I4891 Unspecified atrial fibrillation: Secondary | ICD-10-CM | POA: Insufficient documentation

## 2020-12-12 DIAGNOSIS — Z7901 Long term (current) use of anticoagulants: Secondary | ICD-10-CM | POA: Insufficient documentation

## 2020-12-12 DIAGNOSIS — I428 Other cardiomyopathies: Secondary | ICD-10-CM | POA: Insufficient documentation

## 2020-12-12 DIAGNOSIS — Z79899 Other long term (current) drug therapy: Secondary | ICD-10-CM | POA: Insufficient documentation

## 2020-12-12 DIAGNOSIS — I5022 Chronic systolic (congestive) heart failure: Secondary | ICD-10-CM | POA: Insufficient documentation

## 2020-12-12 LAB — CBC
HCT: 47.1 % (ref 39.0–52.0)
Hemoglobin: 15.1 g/dL (ref 13.0–17.0)
MCH: 27.8 pg (ref 26.0–34.0)
MCHC: 32.1 g/dL (ref 30.0–36.0)
MCV: 86.7 fL (ref 80.0–100.0)
Platelets: 172 10*3/uL (ref 150–400)
RBC: 5.43 MIL/uL (ref 4.22–5.81)
RDW: 14.6 % (ref 11.5–15.5)
WBC: 8.5 10*3/uL (ref 4.0–10.5)
nRBC: 0 % (ref 0.0–0.2)

## 2020-12-12 LAB — BASIC METABOLIC PANEL
Anion gap: 7 (ref 5–15)
BUN: 13 mg/dL (ref 8–23)
CO2: 23 mmol/L (ref 22–32)
Calcium: 9.3 mg/dL (ref 8.9–10.3)
Chloride: 105 mmol/L (ref 98–111)
Creatinine, Ser: 1.34 mg/dL — ABNORMAL HIGH (ref 0.61–1.24)
GFR, Estimated: 57 mL/min — ABNORMAL LOW (ref >60)
Glucose, Bld: 104 mg/dL — ABNORMAL HIGH (ref 70–99)
Potassium: 4.3 mmol/L (ref 3.5–5.1)
Sodium: 135 mmol/L (ref 135–145)

## 2020-12-12 MED ORDER — FUROSEMIDE 20 MG PO TABS
20.0000 mg | ORAL_TABLET | Freq: Every day | ORAL | 3 refills | Status: DC
Start: 2020-12-12 — End: 2021-04-12

## 2020-12-12 MED ORDER — FUROSEMIDE 20 MG PO TABS: 20.0000 mg | ORAL_TABLET | Freq: Every day | ORAL | 3 refills | Status: DC

## 2020-12-12 NOTE — Progress Notes (Deleted)
Electrophysiology Office Note   Date:  12/12/2020   ID:  Jon Oconnell, DOB [birth year], MRN [id number]  PCP:  [facility]  Cardiologist:  Marica Otter Primary Electrophysiologist:  Will Meredith Leeds, MD    Chief Complaint: CHF   History of Present Illness: Jon Oconnell is a 70 y.o. male who is being seen today for the evaluation of CHF at the request of Jon Oconnell. Presenting today for electrophysiology evaluation.  He has a history significant for nonischemic cardiomyopathy, prior Hodgkin's lymphoma, and atrial fibrillation.  In 2014 he had a left heart catheterization showed no obstructive coronary artery disease.  He is status post Medtronic ICD.  He went to the hospital in [place] and was found to have rapid atrial fibrillation.  He started on Eliquis and had a TEE and cardioversion but he did have a left atrial appendage thrombus.  He was admitted to [facility] 04/20/2019 with decompensated heart failure.  He underwent TEE and cardioversion 05/13/2019.  His ejection fraction was found to be 30 to 35%.  He is now status post AF ablation 09/16/2019.  Post ablation, his ejection fraction recovered.  Today, denies symptoms of palpitations, chest pain, shortness of breath, orthopnea, PND, lower extremity edema, claudication, dizziness, presyncope, syncope, bleeding, or neurologic sequela. The patient is tolerating medications without difficulties. ***    Past Medical History:  Diagnosis Date  . AICD (automatic cardioverter/defibrillator) present   . Atrial fibrillation with RVR ([place]) 04/08/2019  . Cardiac defibrillator in place 04/08/2019  . Chronic congestive heart failure ([place]) 04/08/2019  . Depression with anxiety 04/08/2019  . Dilated cardiomyopathy ([place]) 04/09/2019  . Dyspnea    with exertion  . Dysrhythmia    A-fib  . Hyperlipidemia 04/08/2019  . 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 NICM (nonischemic cardiomyopathy) ([place])   . Non-ischemic cardiomyopathy  ([place]) 03/03/2017  . 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    Past Surgical History:  Procedure Laterality Date  . ATRIAL FIBRILLATION ABLATION N/A 09/16/2019   Procedure: ATRIAL FIBRILLATION ABLATION;  Surgeon: Constance Haw, MD;  Location: [place] CV LAB;  Service: Cardiovascular;  Laterality: N/A;  . CARDIOVERSION N/A 05/19/2019   Procedure: CARDIOVERSION;  Surgeon: Larey Dresser, MD;  Location: [facility] ENDOSCOPY;  Service: Cardiovascular;  Laterality: N/A;  . ICD IMPLANT Left   . RIGHT/LEFT HEART CATH AND CORONARY ANGIOGRAPHY N/A 04/23/2019   Procedure: RIGHT/LEFT HEART CATH AND CORONARY ANGIOGRAPHY;  Surgeon: Larey Dresser, MD;  Location: [place] CV LAB;  Service: Cardiovascular;  Laterality: N/A;  . TEE WITHOUT CARDIOVERSION N/A 05/19/2019   Procedure: TRANSESOPHAGEAL ECHOCARDIOGRAM (TEE);  Surgeon: Larey Dresser, MD;  Location: [facility] ENDOSCOPY;  Service: Cardiovascular;  Laterality: N/A;     Current Outpatient Medications  Medication Sig Dispense Refill  . apixaban (ELIQUIS) 5 MG TABS tablet Take 1 tablet (5 mg total) by mouth 2 (two) times daily. 60 tablet 5  . DOXYCYCLINE MONOHYDRATE PO Take 50 mg by mouth daily.    Marland Kitchen eplerenone (INSPRA) 25 MG tablet Take 1 tablet (25 mg total) by mouth daily. 90 tablet 3  . ipratropium (ATROVENT) 0.02 % nebulizer solution Take 0.5 mg by nebulization every 6 (six) hours as needed for wheezing or shortness of breath.    . losartan (COZAAR) 25 MG tablet Take 12.5 mg by mouth daily.    . meclizine (ANTIVERT) 25 MG tablet Take 1 tablet (25 mg total) by mouth 2 (two) times daily as needed for dizziness. [place]  tablet 2  . metoprolol succinate (TOPROL-XL) 25 MG 24 hr tablet Take 12.5 mg by mouth daily.    Marland Kitchen omeprazole (PRILOSEC) 20 MG capsule Take 40 mg by mouth daily.    . Rosuvastatin Calcium 20 MG CPSP Take by mouth.    . sertraline (ZOLOFT) 50 MG tablet Take 50 mg by mouth daily.     . vitamin B-12  (CYANOCOBALAMIN) 250 MCG tablet Take 500 mcg by mouth daily.     No current facility-administered medications for this visit.    Allergies:   Patient has no known allergies.   Social History:  The patient  reports that he has quit smoking. He has never used smokeless tobacco. He reports previous alcohol use. He reports that he does not use drugs.   Family History:  The patient's family history includes Breast cancer in his mother; Stroke in his father.   ROS:  Please see the history of present illness.   Otherwise, review of systems is positive for none.   All other systems are reviewed and negative.   PHYSICAL EXAM: VS:  There were no vitals taken for this visit. , BMI There is no height or weight on file to calculate BMI. GEN: Well nourished, well developed, in no acute distress  HEENT: normal  Neck: no JVD, carotid bruits, or masses Cardiac: ***RRR; no murmurs, rubs, or gallops,no edema  Respiratory:  clear to auscultation bilaterally, normal work of breathing GI: soft, nontender, nondistended, + BS MS: no deformity or atrophy  Skin: warm and dry, device site well healed Neuro:  Strength and sensation are intact Psych: euthymic mood, full affect  EKG:  EKG {ACTION; IS/IS [id number]} ordered today. Personal review of the ekg ordered *** shows ***  Personal review of the device interrogation today. Results in [place]: 06/20/2020: B Natriuretic Peptide 250.4; Hemoglobin 13.1; Platelets 154 08/21/2020: BUN 12; Creatinine, Ser 1.28; Potassium 3.9; Sodium 137    Lipid Panel  No results found for: CHOL, TRIG, HDL, CHOLHDL, VLDL, LDLCALC, LDLDIRECT   Wt Readings from Last 3 Encounters:  08/21/20 202 lb 3.2 oz (91.7 kg)  06/20/20 202 lb 12.8 oz (92 kg)  06/06/20 200 lb (90.7 kg)      Other studies Reviewed: Additional studies/ records that were reviewed today include: TEE 01/21/2020 Review of the above records today demonstrates:  1. Left ventricular  ejection fraction, by estimation, is 55%. The left  ventricle has normal function. The left ventricle has no regional wall  motion abnormalities. Left ventricular diastolic parameters are consistent  with Grade I diastolic dysfunction  (impaired relaxation).  2. Right ventricular systolic function is normal. The right ventricular  size is normal. There is normal pulmonary artery systolic pressure. The  estimated right ventricular systolic pressure is 86.5 mmHg.  3. Left atrial size was mildly dilated.  4. The mitral valve is normal in structure. No evidence of mitral valve  regurgitation. No evidence of mitral stenosis.  5. The aortic valve is tricuspid. Aortic valve regurgitation is not  visualized. No aortic stenosis is present.  6. The inferior vena cava is normal in size with greater than 50%  respiratory variability, suggesting right atrial pressure of 3 mmHg.    ASSESSMENT AND PLAN:  1.  Chronic systolic heart failure due to nonischemic cardiomyopathy: Currently on eplerenone.  He is status post Medtronic ICD.  Fortunately ejection fraction has improved back to normal post ablation.  Device functioning appropriately.  No changes at this time.  ***  2.  Persistent atrial fibrillation: Currently on Eliquis.  Status post AF ablation 09/16/2019.  ***  3.  Syncope: ***   Current medicines are reviewed at length with the patient today.   The patient does not have concerns regarding his medicines.  The following changes were made today: ***  Labs/ tests ordered today include:  No orders of the defined types were placed in this encounter.    Disposition:   FU with Will Camnitz *** months  Signed, Will Meredith Leeds, MD  12/12/2020 11:31 AM     [facility] [address] [place] [address] [phone] (office) [phone] (fax)

## 2020-12-12 NOTE — Patient Instructions (Signed)
Start Furosemide 20 mg Daily  Labs done today, we will call you for abnormal results  Your physician recommends that you return for lab work in: 1 week, we have provided you a prescription to have this done locally  Your physician recommends that you schedule a follow-up appointment in: 3 months with echocardiogram  If you have any questions or concerns before your next appointment please send [place] a message through [place] or call our office at [phone].    TO LEAVE A MESSAGE FOR THE NURSE SELECT OPTION 2, PLEASE LEAVE A MESSAGE INCLUDING: . YOUR NAME . DATE OF BIRTH . CALL BACK NUMBER . REASON FOR CALL**this is important as we prioritize the call backs  [place] AS LONG AS YOU CALL BEFORE 4:00 PM  At the [facility], you and your health needs are our priority. As part of our continuing mission to provide you with exceptional heart care, we have created designated Provider Care Teams. These Care Teams include your primary Cardiologist (physician) and Advanced Practice Providers (APPs- Physician Assistants and Nurse Practitioners) who all work together to provide you with the care you need, when you need it.   You may see any of the following providers on your designated Care Team at your next follow up: Marland Kitchen Dr Glori Bickers . Dr [name] . Dr Vickki Muff . Darrick Grinder, NP . Lyda Jester, [place] . Audry Riles, PharmD   Please be sure to bring in all your medications bottles to every appointment.
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
## 2021-02-02 ENCOUNTER — Ambulatory Visit (INDEPENDENT_AMBULATORY_CARE_PROVIDER_SITE_OTHER): Payer: No Typology Code available for payment source

## 2021-02-02 DIAGNOSIS — I428 Other cardiomyopathies: Secondary | ICD-10-CM

## 2021-02-02 LAB — CUP PACEART REMOTE DEVICE CHECK
Battery Remaining Longevity: 46 mo
Battery Voltage: 2.98 V
Brady Statistic RV Percent Paced: 0 %
Date Time Interrogation Session: 20220513105238
HighPow Impedance: 61 Ohm
Implantable Lead Implant Date: 20140818
Implantable Lead Location: 753860
Implantable Pulse Generator Implant Date: 20140818
Lead Channel Impedance Value: 323 Ohm
Lead Channel Impedance Value: 380 Ohm
Lead Channel Pacing Threshold Amplitude: 0.75 V
Lead Channel Pacing Threshold Pulse Width: 0.4 ms
Lead Channel Sensing Intrinsic Amplitude: 11.5 mV
Lead Channel Sensing Intrinsic Amplitude: 11.5 mV
Lead Channel Setting Pacing Amplitude: 2 V
Lead Channel Setting Pacing Pulse Width: 0.4 ms
Lead Channel Setting Sensing Sensitivity: 0.3 mV

## 2021-02-15 NOTE — Progress Notes (Signed)
Remote ICD transmission.   

## 2021-03-07 ENCOUNTER — Other Ambulatory Visit: Payer: Self-pay

## 2021-03-07 ENCOUNTER — Ambulatory Visit (HOSPITAL_COMMUNITY)
Admission: RE | Admit: 2021-03-07 | Discharge: 2021-03-07 | Disposition: A | Discharge status: Home / Self Care | Payer: Self-pay | Source: Ambulatory Visit | Attending: Cardiology | Admitting: Cardiology

## 2021-03-07 ENCOUNTER — Other Ambulatory Visit (HOSPITAL_COMMUNITY): Payer: Self-pay

## 2021-03-07 ENCOUNTER — Encounter (HOSPITAL_COMMUNITY): Payer: Self-pay | Admitting: Cardiology

## 2021-03-07 ENCOUNTER — Telehealth (HOSPITAL_COMMUNITY): Payer: Self-pay | Admitting: Pharmacy Technician

## 2021-03-07 ENCOUNTER — Ambulatory Visit (HOSPITAL_BASED_OUTPATIENT_CLINIC_OR_DEPARTMENT_OTHER)
Admission: RE | Admit: 2021-03-07 | Discharge: 2021-03-07 | Disposition: A | Discharge status: Home / Self Care | Payer: Self-pay | Source: Ambulatory Visit | Attending: Cardiology | Admitting: Cardiology

## 2021-03-07 VITALS — BP 98/70 | HR 92 | Wt 195.8 lb

## 2021-03-07 DIAGNOSIS — E785 Hyperlipidemia, unspecified: Secondary | ICD-10-CM | POA: Insufficient documentation

## 2021-03-07 DIAGNOSIS — Z9221 Personal history of antineoplastic chemotherapy: Secondary | ICD-10-CM | POA: Insufficient documentation

## 2021-03-07 DIAGNOSIS — Z7984 Long term (current) use of oral hypoglycemic drugs: Secondary | ICD-10-CM | POA: Insufficient documentation

## 2021-03-07 DIAGNOSIS — I5022 Chronic systolic (congestive) heart failure: Secondary | ICD-10-CM | POA: Insufficient documentation

## 2021-03-07 DIAGNOSIS — Z7901 Long term (current) use of anticoagulants: Secondary | ICD-10-CM | POA: Insufficient documentation

## 2021-03-07 DIAGNOSIS — I428 Other cardiomyopathies: Secondary | ICD-10-CM | POA: Insufficient documentation

## 2021-03-07 DIAGNOSIS — R55 Syncope and collapse: Secondary | ICD-10-CM | POA: Insufficient documentation

## 2021-03-07 DIAGNOSIS — Z95810 Presence of automatic (implantable) cardiac defibrillator: Secondary | ICD-10-CM | POA: Insufficient documentation

## 2021-03-07 DIAGNOSIS — G4733 Obstructive sleep apnea (adult) (pediatric): Secondary | ICD-10-CM | POA: Insufficient documentation

## 2021-03-07 DIAGNOSIS — Z79899 Other long term (current) drug therapy: Secondary | ICD-10-CM | POA: Insufficient documentation

## 2021-03-07 DIAGNOSIS — I4891 Unspecified atrial fibrillation: Secondary | ICD-10-CM | POA: Insufficient documentation

## 2021-03-07 DIAGNOSIS — Z8572 Personal history of non-Hodgkin lymphomas: Secondary | ICD-10-CM | POA: Insufficient documentation

## 2021-03-07 DIAGNOSIS — N183 Chronic kidney disease, stage 3 unspecified: Secondary | ICD-10-CM | POA: Insufficient documentation

## 2021-03-07 DIAGNOSIS — I480 Paroxysmal atrial fibrillation: Secondary | ICD-10-CM

## 2021-03-07 LAB — BASIC METABOLIC PANEL
Anion gap: 8 (ref 5–15)
BUN: 15 mg/dL (ref 8–23)
CO2: 27 mmol/L (ref 22–32)
Calcium: 9.4 mg/dL (ref 8.9–10.3)
Chloride: 103 mmol/L (ref 98–111)
Creatinine, Ser: 1.38 mg/dL — ABNORMAL HIGH (ref 0.61–1.24)
GFR, Estimated: 55 mL/min — ABNORMAL LOW (ref >60)
Glucose, Bld: 88 mg/dL (ref 70–99)
Potassium: 4.2 mmol/L (ref 3.5–5.1)
Sodium: 138 mmol/L (ref 135–145)

## 2021-03-07 LAB — ECHOCARDIOGRAM COMPLETE
Area-P 1/2: 3.48 cm2
Calc EF: 30.2 %
S' Lateral: 4.8 cm
Single Plane A2C EF: 34.4 %
Single Plane A4C EF: 21.2 %

## 2021-03-07 LAB — BRAIN NATRIURETIC PEPTIDE: B Natriuretic Peptide: 524.3 pg/mL — ABNORMAL HIGH (ref 0.0–100.0)

## 2021-03-07 MED ORDER — DAPAGLIFLOZIN PROPANEDIOL 10 MG PO TABS: 10.0000 mg | ORAL_TABLET | Freq: Every day | ORAL | 11 refills | Status: DC
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
## 2021-03-07 NOTE — Telephone Encounter (Signed)
Advanced Heart Failure Patient Advocate Encounter  Patient was seen in clinic today and started on Farxiga. The patient is currently uninsured. Started an application for [employer].   Will fax in once signatures are obtained.

## 2021-03-07 NOTE — Patient Instructions (Signed)
Labs done today. We will contact you only if your labs are abnormal.  START Jon Oconnell 10mg  (1 tablet) by mouth daily.   No other medication changes were made. Please continue all current medications as prescribed.  Your physician recommends that you schedule a follow-up appointment in: 10 days for a lab only appointment in [place] and in 3 weeks with our clinic pharmacist and in 2 months with Dr. [name]  If you have any questions or concerns before your next appointment please send [place] a message through [facility] or call our office at [phone].    TO LEAVE A MESSAGE FOR THE NURSE SELECT OPTION 2, PLEASE LEAVE A MESSAGE INCLUDING: YOUR NAME DATE OF BIRTH CALL BACK NUMBER REASON FOR CALL**this is important as we prioritize the call backs  YOU WILL RECEIVE A CALL BACK THE SAME DAY AS LONG AS YOU CALL BEFORE 4:00 PM   Do the following things EVERYDAY: Weigh yourself in the morning before breakfast. Write it down and keep it in a log. Take your medicines as prescribed Eat low salt foods--Limit salt (sodium) to 2000 mg per day.  Stay as active as you can everyday Limit all fluids for the day to less than 2 liters   At the [facility], you and your health needs are our priority. As part of our continuing mission to provide you with exceptional heart care, we have created designated Provider Care Teams. These Care Teams include your primary Cardiologist (physician) and Advanced Practice Providers (APPs- Physician Assistants and Nurse Practitioners) who all work together to provide you with the care you need, when you need it.   You may see any of the following providers on your designated Care Team at your next follow up: Dr Glori Bickers Dr Haynes Kerns, NP Lyda Jester, [place] Audry Riles, PharmD   Please be sure to bring in all your medications bottles to every appointment.

## 2021-03-07 NOTE — Progress Notes (Signed)
Echocardiogram 2D Echocardiogram has been performed.  Oneal Deputy Kerron Sedano 03/07/2021, 10:26 AM

## 2021-03-09 NOTE — Telephone Encounter (Addendum)
Sent in application via fax.  Will follow up.  

## 2021-03-20 ENCOUNTER — Other Ambulatory Visit (HOSPITAL_COMMUNITY): Payer: Self-pay | Admitting: *Deleted

## 2021-03-20 MED ORDER — EMPAGLIFLOZIN 10 MG PO TABS: 10.0000 mg | ORAL_TABLET | Freq: Every day | ORAL | 11 refills | Status: DC

## 2021-03-21 ENCOUNTER — Telehealth (HOSPITAL_COMMUNITY): Payer: Self-pay | Admitting: Pharmacy Technician

## 2021-03-21 NOTE — Telephone Encounter (Signed)
Advanced Heart Failure Patient Advocate Encounter  Patient receives VA benefits, switched to [place] and sent over to [place].  No Farxiga assistance needed.  Charlann Boxer, CPhT

## 2021-03-23 ENCOUNTER — Telehealth (HOSPITAL_COMMUNITY): Payer: Self-pay | Admitting: Pharmacy Technician

## 2021-03-23 NOTE — Telephone Encounter (Signed)
Advanced Heart Failure Patient Advocate Encounter  Patient was approved to receive Farxiga from [employer]. I called [employer] and canceled the patient's assistance. He is now on Jardiance through the [place]. I ensured that the representative canceled the RX so that no medication would be delivered to the patient.  Charlann Boxer, CPhT

## 2021-04-12 ENCOUNTER — Ambulatory Visit (HOSPITAL_COMMUNITY)
Admission: RE | Admit: 2021-04-12 | Discharge: 2021-04-12 | Disposition: A | Discharge status: Home / Self Care | Payer: Medicare PPO | Source: Ambulatory Visit | Attending: Cardiology | Admitting: Cardiology

## 2021-04-12 ENCOUNTER — Other Ambulatory Visit: Payer: Self-pay

## 2021-04-12 ENCOUNTER — Encounter (HOSPITAL_COMMUNITY): Payer: Self-pay

## 2021-04-12 VITALS — BP 104/72 | HR 88 | Wt 190.6 lb

## 2021-04-12 DIAGNOSIS — N183 Chronic kidney disease, stage 3 unspecified: Secondary | ICD-10-CM | POA: Insufficient documentation

## 2021-04-12 DIAGNOSIS — Z9221 Personal history of antineoplastic chemotherapy: Secondary | ICD-10-CM | POA: Insufficient documentation

## 2021-04-12 DIAGNOSIS — Z8572 Personal history of non-Hodgkin lymphomas: Secondary | ICD-10-CM | POA: Insufficient documentation

## 2021-04-12 DIAGNOSIS — Z79899 Other long term (current) drug therapy: Secondary | ICD-10-CM | POA: Diagnosis not present

## 2021-04-12 DIAGNOSIS — Q245 Malformation of coronary vessels: Secondary | ICD-10-CM | POA: Insufficient documentation

## 2021-04-12 DIAGNOSIS — Z95810 Presence of automatic (implantable) cardiac defibrillator: Secondary | ICD-10-CM | POA: Diagnosis not present

## 2021-04-12 DIAGNOSIS — Z8616 Personal history of COVID-19: Secondary | ICD-10-CM | POA: Insufficient documentation

## 2021-04-12 DIAGNOSIS — I5022 Chronic systolic (congestive) heart failure: Secondary | ICD-10-CM | POA: Insufficient documentation

## 2021-04-12 DIAGNOSIS — I4891 Unspecified atrial fibrillation: Secondary | ICD-10-CM | POA: Diagnosis not present

## 2021-04-12 DIAGNOSIS — I428 Other cardiomyopathies: Secondary | ICD-10-CM | POA: Diagnosis not present

## 2021-04-12 LAB — BASIC METABOLIC PANEL
Anion gap: 8 (ref 5–15)
BUN: 12 mg/dL (ref 8–23)
CO2: 24 mmol/L (ref 22–32)
Calcium: 9.2 mg/dL (ref 8.9–10.3)
Chloride: 104 mmol/L (ref 98–111)
Creatinine, Ser: 1.24 mg/dL (ref 0.61–1.24)
GFR, Estimated: >60 mL/min (ref >60)
Glucose, Bld: 152 mg/dL — ABNORMAL HIGH (ref 70–99)
Potassium: 4 mmol/L (ref 3.5–5.1)
Sodium: 136 mmol/L (ref 135–145)

## 2021-04-12 MED ORDER — FUROSEMIDE 20 MG PO TABS: 20.0000 mg | ORAL_TABLET | Freq: Every day | ORAL | 3 refills | Status: DC | PRN
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
## 2021-04-12 NOTE — Addendum Note (Signed)
Encounter addended by: Marya Landry, [place] on: 04/12/2021 3:48 PM  Actions taken: Clinical Note Signed

## 2021-04-12 NOTE — Patient Instructions (Signed)
It was a pleasure seeing you today!  MEDICATIONS: -We are changing your medications today -Decrease your lasix to 1 tablet (20 mg) daily AS NEEDED for fluid or swelling -Call if you have questions about your medications.  LABS: -We will call you if your labs need attention.  NEXT APPOINTMENT: Return to clinic in 2 weeks with NP/PA.  In general, to take care of your heart failure: -Limit your fluid intake to 2 Liters (half-gallon) per day.   -Limit your salt intake to ideally 2-3 grams (2000-3000 mg) per day. -Weigh yourself daily and record, and bring that "weight diary" to your next appointment.  (Weight gain of 2-3 pounds in 1 day typically means fluid weight.) -The medications for your heart are to help your heart and help you live longer.   -Please contact us before stopping any of your heart medications.  Call the clinic at [phone] with questions or to reschedule future appointments.

## 2021-04-30 ENCOUNTER — Encounter (HOSPITAL_COMMUNITY): Payer: Medicare PPO

## 2021-05-04 ENCOUNTER — Ambulatory Visit: Payer: No Typology Code available for payment source

## 2021-05-17 ENCOUNTER — Encounter (HOSPITAL_COMMUNITY): Payer: Self-pay | Admitting: Cardiology

## 2021-05-17 ENCOUNTER — Other Ambulatory Visit: Payer: Self-pay

## 2021-05-17 ENCOUNTER — Ambulatory Visit (HOSPITAL_COMMUNITY)
Admission: RE | Admit: 2021-05-17 | Discharge: 2021-05-17 | Disposition: A | Discharge status: Home / Self Care | Payer: Medicare PPO | Source: Ambulatory Visit | Attending: Cardiology | Admitting: Cardiology

## 2021-05-17 VITALS — BP 100/60 | HR 76 | Wt 186.0 lb

## 2021-05-17 DIAGNOSIS — Z7901 Long term (current) use of anticoagulants: Secondary | ICD-10-CM | POA: Insufficient documentation

## 2021-05-17 DIAGNOSIS — E785 Hyperlipidemia, unspecified: Secondary | ICD-10-CM | POA: Diagnosis not present

## 2021-05-17 DIAGNOSIS — Z8616 Personal history of COVID-19: Secondary | ICD-10-CM | POA: Insufficient documentation

## 2021-05-17 DIAGNOSIS — I4891 Unspecified atrial fibrillation: Secondary | ICD-10-CM | POA: Insufficient documentation

## 2021-05-17 DIAGNOSIS — I5022 Chronic systolic (congestive) heart failure: Secondary | ICD-10-CM | POA: Diagnosis not present

## 2021-05-17 DIAGNOSIS — I428 Other cardiomyopathies: Secondary | ICD-10-CM | POA: Insufficient documentation

## 2021-05-17 DIAGNOSIS — Z87891 Personal history of nicotine dependence: Secondary | ICD-10-CM | POA: Diagnosis not present

## 2021-05-17 DIAGNOSIS — Z95810 Presence of automatic (implantable) cardiac defibrillator: Secondary | ICD-10-CM | POA: Diagnosis not present

## 2021-05-17 DIAGNOSIS — Z09 Encounter for follow-up examination after completed treatment for conditions other than malignant neoplasm: Secondary | ICD-10-CM | POA: Insufficient documentation

## 2021-05-17 DIAGNOSIS — N183 Chronic kidney disease, stage 3 unspecified: Secondary | ICD-10-CM | POA: Insufficient documentation

## 2021-05-17 DIAGNOSIS — Z79899 Other long term (current) drug therapy: Secondary | ICD-10-CM | POA: Diagnosis not present

## 2021-05-17 LAB — BASIC METABOLIC PANEL
Anion gap: 8 (ref 5–15)
BUN: 9 mg/dL (ref 8–23)
CO2: 26 mmol/L (ref 22–32)
Calcium: 9.4 mg/dL (ref 8.9–10.3)
Chloride: 103 mmol/L (ref 98–111)
Creatinine, Ser: 1.29 mg/dL — ABNORMAL HIGH (ref 0.61–1.24)
GFR, Estimated: 59 mL/min — ABNORMAL LOW (ref >60)
Glucose, Bld: 91 mg/dL (ref 70–99)
Potassium: 4.2 mmol/L (ref 3.5–5.1)
Sodium: 137 mmol/L (ref 135–145)

## 2021-05-17 LAB — CBC
HCT: 46.7 % (ref 39.0–52.0)
Hemoglobin: 15.3 g/dL (ref 13.0–17.0)
MCH: 28.7 pg (ref 26.0–34.0)
MCHC: 32.8 g/dL (ref 30.0–36.0)
MCV: 87.6 fL (ref 80.0–100.0)
Platelets: 177 10*3/uL (ref 150–400)
RBC: 5.33 MIL/uL (ref 4.22–5.81)
RDW: 14.3 % (ref 11.5–15.5)
WBC: 7.9 10*3/uL (ref 4.0–10.5)
nRBC: 0 % (ref 0.0–0.2)

## 2021-05-17 LAB — LIPID PANEL
Cholesterol: 172 mg/dL (ref 0–200)
HDL: 48 mg/dL (ref >40)
LDL Cholesterol: 107 mg/dL — ABNORMAL HIGH (ref 0–99)
Total CHOL/HDL Ratio: 3.6 RATIO
Triglycerides: 87 mg/dL (ref <150)
VLDL: 17 mg/dL (ref 0–40)

## 2021-05-17 MED ORDER — METOPROLOL SUCCINATE ER 25 MG PO TB24: 25.0000 mg | ORAL_TABLET | Freq: Every day | ORAL | 3 refills | Status: DC
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
## 2021-05-17 NOTE — Patient Instructions (Signed)
EKG done today.   Labs done today. We will contact you only if your labs are abnormal.  INCREASE Metoprolol to '25mg'$  (1 tablet) by mouth daily.  No other medication changes were made. Please continue all current medications as prescribed.  Your physician recommends that you schedule a follow-up appointment in: 3-4 weeks with our Clinic Pharmacist and in 2 months with our APP Clinic here in our office.   If you have any questions or concerns before your next appointment please send [place] a message through [place] or call our office at [phone].    TO LEAVE A MESSAGE FOR THE NURSE SELECT OPTION 2, PLEASE LEAVE A MESSAGE INCLUDING: YOUR NAME DATE OF BIRTH CALL BACK NUMBER REASON FOR CALL**this is important as we prioritize the call backs  YOU WILL RECEIVE A CALL BACK THE SAME DAY AS LONG AS YOU CALL BEFORE 4:00 PM   Do the following things EVERYDAY: Weigh yourself in the morning before breakfast. Write it down and keep it in a log. Take your medicines as prescribed Eat low salt foods--Limit salt (sodium) to 2000 mg per day.  Stay as active as you can everyday Limit all fluids for the day to less than 2 liters   At the [facility], you and your health needs are our priority. As part of our continuing mission to provide you with exceptional heart care, we have created designated Provider Care Teams. These Care Teams include your primary Cardiologist (physician) and Advanced Practice Providers (APPs- Physician Assistants and Nurse Practitioners) who all work together to provide you with the care you need, when you need it.   You may see any of the following providers on your designated Care Team at your next follow up: Dr Glori Bickers Dr Haynes Kerns, NP Lyda Jester, [place] Audry Riles, PharmD   Please be sure to bring in all your medications bottles to every appointment.

## 2021-05-29 ENCOUNTER — Other Ambulatory Visit (HOSPITAL_COMMUNITY): Payer: Self-pay | Admitting: *Deleted

## 2021-05-29 MED ORDER — METOPROLOL SUCCINATE ER 25 MG PO TB24: 12.5000 mg | ORAL_TABLET | Freq: Every day | ORAL | 3 refills | Status: DC

## 2021-05-29 NOTE — Telephone Encounter (Signed)
Pt called stating he passed out and went to the [facility] hospital and they decreased his metoprolol from '25mg'$  to 12.5 daily. Pt said he feels better and bp is stable. Pt wants to know if he needs to make any other changes.  Routed to [place] for advice

## 2021-06-11 ENCOUNTER — Other Ambulatory Visit: Payer: Self-pay

## 2021-06-11 ENCOUNTER — Ambulatory Visit (HOSPITAL_COMMUNITY)
Admission: RE | Admit: 2021-06-11 | Discharge: 2021-06-11 | Disposition: A | Discharge status: Home / Self Care | Payer: Medicare PPO | Source: Ambulatory Visit | Attending: Internal Medicine | Admitting: Internal Medicine

## 2021-06-11 DIAGNOSIS — I5022 Chronic systolic (congestive) heart failure: Secondary | ICD-10-CM
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
## 2021-06-11 NOTE — Patient Instructions (Signed)
It was a pleasure seeing you today!  MEDICATIONS: -No medication changes today -Call if you have questions about your medications.  NEXT APPOINTMENT: Return to clinic in 5 weeks with APP Clinic.  In general, to take care of your heart failure: -Limit your fluid intake to 2 Liters (half-gallon) per day.   -Limit your salt intake to ideally 2-3 grams (2000-3000 mg) per day. -Weigh yourself daily and record, and bring that "weight diary" to your next appointment.  (Weight gain of 2-3 pounds in 1 day typically means fluid weight.) -The medications for your heart are to help your heart and help you live longer.   -Please contact us before stopping any of your heart medications.  Call the clinic at [phone] with questions or to reschedule future appointments.  

## 2021-07-17 ENCOUNTER — Encounter (HOSPITAL_COMMUNITY): Payer: Medicare PPO

## 2021-07-20 ENCOUNTER — Encounter (HOSPITAL_COMMUNITY): Payer: Medicare PPO

## 2021-07-26 ENCOUNTER — Other Ambulatory Visit: Payer: Self-pay

## 2021-07-26 ENCOUNTER — Ambulatory Visit (HOSPITAL_COMMUNITY)
Admission: RE | Admit: 2021-07-26 | Discharge: 2021-07-26 | Disposition: A | Discharge status: Home / Self Care | Payer: Medicare PPO | Source: Ambulatory Visit | Attending: Cardiology | Admitting: Cardiology

## 2021-07-26 ENCOUNTER — Encounter (HOSPITAL_COMMUNITY): Payer: Self-pay

## 2021-07-26 VITALS — BP 86/69 | HR 85 | Wt 186.4 lb

## 2021-07-26 DIAGNOSIS — Z95810 Presence of automatic (implantable) cardiac defibrillator: Secondary | ICD-10-CM | POA: Insufficient documentation

## 2021-07-26 DIAGNOSIS — Z7984 Long term (current) use of oral hypoglycemic drugs: Secondary | ICD-10-CM | POA: Diagnosis not present

## 2021-07-26 DIAGNOSIS — N1831 Chronic kidney disease, stage 3a: Secondary | ICD-10-CM

## 2021-07-26 DIAGNOSIS — Q245 Malformation of coronary vessels: Secondary | ICD-10-CM | POA: Diagnosis not present

## 2021-07-26 DIAGNOSIS — R55 Syncope and collapse: Secondary | ICD-10-CM | POA: Diagnosis not present

## 2021-07-26 DIAGNOSIS — N183 Chronic kidney disease, stage 3 unspecified: Secondary | ICD-10-CM | POA: Insufficient documentation

## 2021-07-26 DIAGNOSIS — Z8572 Personal history of non-Hodgkin lymphomas: Secondary | ICD-10-CM | POA: Insufficient documentation

## 2021-07-26 DIAGNOSIS — I428 Other cardiomyopathies: Secondary | ICD-10-CM | POA: Diagnosis not present

## 2021-07-26 DIAGNOSIS — I081 Rheumatic disorders of both mitral and tricuspid valves: Secondary | ICD-10-CM | POA: Diagnosis not present

## 2021-07-26 DIAGNOSIS — I4891 Unspecified atrial fibrillation: Secondary | ICD-10-CM | POA: Diagnosis not present

## 2021-07-26 DIAGNOSIS — I5022 Chronic systolic (congestive) heart failure: Secondary | ICD-10-CM | POA: Insufficient documentation

## 2021-07-26 DIAGNOSIS — Z7901 Long term (current) use of anticoagulants: Secondary | ICD-10-CM | POA: Diagnosis not present

## 2021-07-26 DIAGNOSIS — I480 Paroxysmal atrial fibrillation: Secondary | ICD-10-CM | POA: Diagnosis not present

## 2021-07-26 DIAGNOSIS — Z87898 Personal history of other specified conditions: Secondary | ICD-10-CM

## 2021-07-26 DIAGNOSIS — Z79899 Other long term (current) drug therapy: Secondary | ICD-10-CM | POA: Insufficient documentation

## 2021-07-26 DIAGNOSIS — Z8616 Personal history of COVID-19: Secondary | ICD-10-CM | POA: Diagnosis not present

## 2021-07-26 NOTE — Patient Instructions (Signed)
Your physician has requested that you have an echocardiogram. Echocardiography is a painless test that uses sound waves to create images of your heart. It provides your doctor with information about the size and shape of your heart and how well your heart's chambers and valves are working. This procedure takes approximately one hour. There are no restrictions for this procedure.   Your physician recommends that you schedule a follow-up appointment in: 3 months with an ECHO  Please call office at [phone] option 2 if you have any questions or concerns.   At the [facility], you and your health needs are our priority. As part of our continuing mission to provide you with exceptional heart care, we have created designated Provider Care Teams. These Care Teams include your primary Cardiologist (physician) and Advanced Practice Providers (APPs- Physician Assistants and Nurse Practitioners) who all work together to provide you with the care you need, when you need it.   You may see any of the following providers on your designated Care Team at your next follow up: Dr Glori Bickers Dr Haynes Kerns, NP Lyda Jester, [place] [facility] [place], [place] Audry Riles, PharmD   Please be sure to bring in all your medications bottles to every appointment.
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
## 2021-08-03 ENCOUNTER — Ambulatory Visit (INDEPENDENT_AMBULATORY_CARE_PROVIDER_SITE_OTHER): Payer: Medicare PPO

## 2021-08-03 DIAGNOSIS — I428 Other cardiomyopathies: Secondary | ICD-10-CM

## 2021-08-03 LAB — CUP PACEART REMOTE DEVICE CHECK
Battery Remaining Longevity: 38 mo
Battery Voltage: 2.97 V
Brady Statistic RV Percent Paced: 0.01 %
Date Time Interrogation Session: 20221111012305
HighPow Impedance: 73 Ohm
Implantable Lead Implant Date: 20140818
Implantable Lead Location: 753860
Implantable Pulse Generator Implant Date: 20140818
Lead Channel Impedance Value: 285 Ohm
Lead Channel Impedance Value: 380 Ohm
Lead Channel Pacing Threshold Amplitude: 0.875 V
Lead Channel Pacing Threshold Pulse Width: 0.4 ms
Lead Channel Sensing Intrinsic Amplitude: 12.5 mV
Lead Channel Sensing Intrinsic Amplitude: 12.5 mV
Lead Channel Setting Pacing Amplitude: 2 V
Lead Channel Setting Pacing Pulse Width: 0.4 ms
Lead Channel Setting Sensing Sensitivity: 0.3 mV

## 2021-08-13 NOTE — Progress Notes (Signed)
Remote ICD transmission.   

## 2021-10-26 ENCOUNTER — Other Ambulatory Visit: Payer: Self-pay

## 2021-10-26 ENCOUNTER — Ambulatory Visit (HOSPITAL_COMMUNITY)
Admission: RE | Admit: 2021-10-26 | Discharge: 2021-10-26 | Disposition: A | Discharge status: Home / Self Care | Payer: Medicare PPO | Source: Ambulatory Visit | Attending: Cardiology | Admitting: Cardiology

## 2021-10-26 ENCOUNTER — Encounter: Payer: Self-pay | Admitting: Cardiology

## 2021-10-26 ENCOUNTER — Ambulatory Visit (HOSPITAL_BASED_OUTPATIENT_CLINIC_OR_DEPARTMENT_OTHER)
Admission: RE | Admit: 2021-10-26 | Discharge: 2021-10-26 | Disposition: A | Discharge status: Home / Self Care | Payer: Medicare PPO | Source: Ambulatory Visit | Attending: Cardiology | Admitting: Cardiology

## 2021-10-26 VITALS — BP 117/65 | HR 74 | Wt 189.6 lb

## 2021-10-26 DIAGNOSIS — Z9221 Personal history of antineoplastic chemotherapy: Secondary | ICD-10-CM | POA: Insufficient documentation

## 2021-10-26 DIAGNOSIS — I440 Atrioventricular block, first degree: Secondary | ICD-10-CM | POA: Diagnosis not present

## 2021-10-26 DIAGNOSIS — N183 Chronic kidney disease, stage 3 unspecified: Secondary | ICD-10-CM | POA: Insufficient documentation

## 2021-10-26 DIAGNOSIS — Z7984 Long term (current) use of oral hypoglycemic drugs: Secondary | ICD-10-CM | POA: Insufficient documentation

## 2021-10-26 DIAGNOSIS — Z95810 Presence of automatic (implantable) cardiac defibrillator: Secondary | ICD-10-CM | POA: Diagnosis not present

## 2021-10-26 DIAGNOSIS — I4891 Unspecified atrial fibrillation: Secondary | ICD-10-CM | POA: Insufficient documentation

## 2021-10-26 DIAGNOSIS — Z79899 Other long term (current) drug therapy: Secondary | ICD-10-CM | POA: Insufficient documentation

## 2021-10-26 DIAGNOSIS — E785 Hyperlipidemia, unspecified: Secondary | ICD-10-CM | POA: Diagnosis not present

## 2021-10-26 DIAGNOSIS — Q245 Malformation of coronary vessels: Secondary | ICD-10-CM | POA: Diagnosis not present

## 2021-10-26 DIAGNOSIS — I5022 Chronic systolic (congestive) heart failure: Secondary | ICD-10-CM | POA: Diagnosis present

## 2021-10-26 DIAGNOSIS — Z8572 Personal history of non-Hodgkin lymphomas: Secondary | ICD-10-CM | POA: Insufficient documentation

## 2021-10-26 DIAGNOSIS — I252 Old myocardial infarction: Secondary | ICD-10-CM | POA: Diagnosis not present

## 2021-10-26 DIAGNOSIS — I340 Nonrheumatic mitral (valve) insufficiency: Secondary | ICD-10-CM | POA: Diagnosis not present

## 2021-10-26 DIAGNOSIS — I428 Other cardiomyopathies: Secondary | ICD-10-CM | POA: Insufficient documentation

## 2021-10-26 DIAGNOSIS — Z7901 Long term (current) use of anticoagulants: Secondary | ICD-10-CM | POA: Diagnosis not present

## 2021-10-26 LAB — ECHOCARDIOGRAM COMPLETE
Calc EF: 27.6 %
MV M vel: 3.88 m/s
MV Peak grad: 60.2 mmHg
Radius: 0.3 cm
S' Lateral: 4.6 cm
Single Plane A2C EF: 21.8 %
Single Plane A4C EF: 33.1 %

## 2021-10-26 LAB — BASIC METABOLIC PANEL
Anion gap: 7 (ref 5–15)
BUN: 11 mg/dL (ref 8–23)
CO2: 24 mmol/L (ref 22–32)
Calcium: 9.1 mg/dL (ref 8.9–10.3)
Chloride: 104 mmol/L (ref 98–111)
Creatinine, Ser: 1.25 mg/dL — ABNORMAL HIGH (ref 0.61–1.24)
GFR, Estimated: >60 mL/min (ref >60)
Glucose, Bld: 89 mg/dL (ref 70–99)
Potassium: 4.2 mmol/L (ref 3.5–5.1)
Sodium: 135 mmol/L (ref 135–145)

## 2021-10-26 LAB — CBC
HCT: 47.1 % (ref 39.0–52.0)
Hemoglobin: 15.6 g/dL (ref 13.0–17.0)
MCH: 28.3 pg (ref 26.0–34.0)
MCHC: 33.1 g/dL (ref 30.0–36.0)
MCV: 85.3 fL (ref 80.0–100.0)
Platelets: 159 10*3/uL (ref 150–400)
RBC: 5.52 MIL/uL (ref 4.22–5.81)
RDW: 14.2 % (ref 11.5–15.5)
WBC: 8.2 10*3/uL (ref 4.0–10.5)
nRBC: 0 % (ref 0.0–0.2)

## 2021-10-26 LAB — BRAIN NATRIURETIC PEPTIDE: B Natriuretic Peptide: 602 pg/mL — ABNORMAL HIGH (ref 0.0–100.0)

## 2021-10-26 MED ORDER — METOPROLOL SUCCINATE ER 25 MG PO TB24: 25.0000 mg | ORAL_TABLET | Freq: Every day | ORAL | 3 refills | Status: DC

## 2021-10-26 MED ORDER — EPLERENONE 25 MG PO TABS: 25.0000 mg | ORAL_TABLET | Freq: Every day | ORAL | 3 refills | Status: DC

## 2021-10-26 MED ORDER — LOSARTAN POTASSIUM 25 MG PO TABS: 25.0000 mg | ORAL_TABLET | Freq: Every day | ORAL | 3 refills | Status: DC

## 2021-10-26 NOTE — Progress Notes (Signed)
°  Echocardiogram 2D Echocardiogram has been performed.  Johny Chess 10/26/2021, 11:31 AM

## 2021-10-26 NOTE — Patient Instructions (Addendum)
INCREASE Eplerenone to 25mg  (1 tab) daily  INCREASE Losartan to 25mg  (1 tab) at night  INCREASE Toprol XL to 25mg  (1 tab) at night  Labs today We will only contact you if something comes back abnormal or we need to make some changes. Otherwise no news is good news!  Repeat labs in 10 days. You have been given a script to get this done at your PCP's office  Your physician has recommended that you have a cardiopulmonary stress test (CPX). CPX testing is a non-invasive measurement of heart and lung function. It replaces a traditional treadmill stress test. This type of test provides a tremendous amount of information that relates not only to your present condition but also for future outcomes. This test combines measurements of you ventilation, respiratory gas exchange in the lungs, electrocardiogram (EKG), blood pressure and physical response before, during, and following an exercise protocol.  You will get a call to schedule the cpx.  Your physician recommends that you schedule a follow-up appointment in: 1 month  Please call office at [phone] option 2 if you have any questions or concerns.   At the [facility], you and your health needs are our priority. As part of our continuing mission to provide you with exceptional heart care, we have created designated Provider Care Teams. These Care Teams include your primary Cardiologist (physician) and Advanced Practice Providers (APPs- Physician Assistants and Nurse Practitioners) who all work together to provide you with the care you need, when you need it.   You may see any of the following providers on your designated Care Team at your next follow up: Dr Glori Bickers Dr Haynes Kerns, NP Lyda Jester, [place] [facility], [place] Audry Riles, PharmD   Please be sure to bring in all your medications bottles to every appointment.
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
## 2021-11-02 ENCOUNTER — Ambulatory Visit (INDEPENDENT_AMBULATORY_CARE_PROVIDER_SITE_OTHER): Payer: Medicare PPO

## 2021-11-02 DIAGNOSIS — I428 Other cardiomyopathies: Secondary | ICD-10-CM | POA: Diagnosis not present

## 2021-11-03 LAB — CUP PACEART REMOTE DEVICE CHECK
Battery Remaining Longevity: 43 mo
Battery Voltage: 2.96 V
Brady Statistic RV Percent Paced: 0 %
Date Time Interrogation Session: 20230210042305
HighPow Impedance: 70 Ohm
Implantable Lead Implant Date: 20140818
Implantable Lead Location: 753860
Implantable Pulse Generator Implant Date: 20140818
Lead Channel Impedance Value: 323 Ohm
Lead Channel Impedance Value: 399 Ohm
Lead Channel Pacing Threshold Amplitude: 0.75 V
Lead Channel Pacing Threshold Pulse Width: 0.4 ms
Lead Channel Sensing Intrinsic Amplitude: 13.875 mV
Lead Channel Sensing Intrinsic Amplitude: 13.875 mV
Lead Channel Setting Pacing Amplitude: 2 V
Lead Channel Setting Pacing Pulse Width: 0.4 ms
Lead Channel Setting Sensing Sensitivity: 0.3 mV

## 2021-11-06 NOTE — Progress Notes (Signed)
Remote ICD transmission.   
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
## 2021-12-04 ENCOUNTER — Ambulatory Visit (HOSPITAL_COMMUNITY): Payer: No Typology Code available for payment source

## 2021-12-04 ENCOUNTER — Ambulatory Visit (HOSPITAL_COMMUNITY)
Admission: RE | Admit: 2021-12-04 | Discharge: 2021-12-04 | Disposition: A | Discharge status: Home / Self Care | Payer: No Typology Code available for payment source | Source: Ambulatory Visit | Attending: Family Medicine | Admitting: Family Medicine

## 2021-12-04 ENCOUNTER — Other Ambulatory Visit: Payer: Self-pay

## 2021-12-04 ENCOUNTER — Encounter (HOSPITAL_COMMUNITY): Payer: Self-pay

## 2021-12-04 VITALS — BP 98/64 | HR 64 | Wt 191.0 lb

## 2021-12-04 DIAGNOSIS — I428 Other cardiomyopathies: Secondary | ICD-10-CM | POA: Insufficient documentation

## 2021-12-04 DIAGNOSIS — N183 Chronic kidney disease, stage 3 unspecified: Secondary | ICD-10-CM | POA: Diagnosis not present

## 2021-12-04 DIAGNOSIS — Z7901 Long term (current) use of anticoagulants: Secondary | ICD-10-CM | POA: Diagnosis not present

## 2021-12-04 DIAGNOSIS — Z7984 Long term (current) use of oral hypoglycemic drugs: Secondary | ICD-10-CM | POA: Diagnosis not present

## 2021-12-04 DIAGNOSIS — I4891 Unspecified atrial fibrillation: Secondary | ICD-10-CM | POA: Diagnosis not present

## 2021-12-04 DIAGNOSIS — Z8572 Personal history of non-Hodgkin lymphomas: Secondary | ICD-10-CM | POA: Insufficient documentation

## 2021-12-04 DIAGNOSIS — Z79899 Other long term (current) drug therapy: Secondary | ICD-10-CM | POA: Insufficient documentation

## 2021-12-04 DIAGNOSIS — Q245 Malformation of coronary vessels: Secondary | ICD-10-CM | POA: Diagnosis not present

## 2021-12-04 DIAGNOSIS — I5022 Chronic systolic (congestive) heart failure: Secondary | ICD-10-CM | POA: Insufficient documentation

## 2021-12-04 DIAGNOSIS — I480 Paroxysmal atrial fibrillation: Secondary | ICD-10-CM

## 2021-12-04 DIAGNOSIS — Z87898 Personal history of other specified conditions: Secondary | ICD-10-CM

## 2021-12-04 DIAGNOSIS — N1831 Chronic kidney disease, stage 3a: Secondary | ICD-10-CM

## 2021-12-04 DIAGNOSIS — R55 Syncope and collapse: Secondary | ICD-10-CM | POA: Insufficient documentation

## 2021-12-04 LAB — BRAIN NATRIURETIC PEPTIDE: B Natriuretic Peptide: 491 pg/mL — ABNORMAL HIGH (ref 0.0–100.0)

## 2021-12-04 LAB — BASIC METABOLIC PANEL
Anion gap: 8 (ref 5–15)
BUN: 11 mg/dL (ref 8–23)
CO2: 25 mmol/L (ref 22–32)
Calcium: 9.1 mg/dL (ref 8.9–10.3)
Chloride: 103 mmol/L (ref 98–111)
Creatinine, Ser: 1.2 mg/dL (ref 0.61–1.24)
GFR, Estimated: >60 mL/min (ref >60)
Glucose, Bld: 87 mg/dL (ref 70–99)
Potassium: 4.1 mmol/L (ref 3.5–5.1)
Sodium: 136 mmol/L (ref 135–145)

## 2021-12-04 NOTE — Patient Instructions (Addendum)
Thank you for coming in today ? ?Labs were done today, if any labs are abnormal the clinic will call you  ? ?TAKE Lasix 20 mg 1 tablet daily for 3 days 12/04/21 12/05/21 12/06/21 ?Then back to as needed ? ?Your physician recommends that you schedule a follow-up appointment in:  ?2-3 months with Dr. [name] ? ?At the [facility], you and your health needs are our priority. As part of our continuing mission to provide you with exceptional heart care, we have created designated Provider Care Teams. These Care Teams include your primary Cardiologist (physician) and Advanced Practice Providers (APPs- Physician Assistants and Nurse Practitioners) who all work together to provide you with the care you need, when you need it.  ? ?You may see any of the following providers on your designated Care Team at your next follow up: ?Dr Glori Bickers ?Dr [name] ?Darrick Grinder, NP ?Lyda Jester, PA ?Jessica Milford,NP ?Marlyce Huge, PA ?Audry Riles, PharmD ? ? ?Please be sure to bring in all your medications bottles to every appointment.  ? ?If you have any questions or concerns before your next appointment please send [place] a message through [place] or call our office at [phone].   ? ?TO LEAVE A MESSAGE FOR THE NURSE SELECT OPTION 2, PLEASE LEAVE A MESSAGE INCLUDING: ?YOUR NAME ?DATE OF BIRTH ?CALL BACK NUMBER ?REASON FOR CALL**this is important as we prioritize the call backs ? ?YOU WILL RECEIVE A CALL BACK THE SAME DAY AS LONG AS YOU CALL BEFORE 4:00 PM ? ? ?

## 2021-12-11 ENCOUNTER — Telehealth (HOSPITAL_COMMUNITY): Payer: Self-pay | Admitting: *Deleted

## 2021-12-11 NOTE — Telephone Encounter (Signed)
Pt left vm requesting cpx results mailed to his home. Results printed and mailed to  home address.  ?

## 2021-12-13 ENCOUNTER — Other Ambulatory Visit: Payer: Self-pay | Admitting: Cardiology

## 2022-01-11 ENCOUNTER — Telehealth (HOSPITAL_COMMUNITY): Payer: Self-pay | Admitting: Cardiology

## 2022-01-11 NOTE — Telephone Encounter (Signed)
Patient called to report he was recently hospitalized at the [place]. ?Reports his blood pressure was in the 80's and he did not feel well. At discharge they decreased metoprolol and losartan by half. Patient reports his blood pressure readings have still been low after meds decreased ? ? ? ?HFU appt scheduled on 4/25 ? ?Advised to keep close watch of readings over the weekend, if he becomes symptomatic should report to ER for further eval  ?

## 2022-01-15 ENCOUNTER — Other Ambulatory Visit (HOSPITAL_COMMUNITY): Payer: Self-pay

## 2022-01-15 ENCOUNTER — Encounter (HOSPITAL_COMMUNITY): Payer: Self-pay | Admitting: Cardiology

## 2022-01-15 ENCOUNTER — Ambulatory Visit (HOSPITAL_COMMUNITY)
Admission: RE | Admit: 2022-01-15 | Discharge: 2022-01-15 | Disposition: A | Discharge status: Home / Self Care | Payer: No Typology Code available for payment source | Source: Ambulatory Visit | Attending: Cardiology | Admitting: Cardiology

## 2022-01-15 VITALS — BP 105/60 | HR 117 | Wt 189.0 lb

## 2022-01-15 DIAGNOSIS — Z8572 Personal history of non-Hodgkin lymphomas: Secondary | ICD-10-CM | POA: Diagnosis not present

## 2022-01-15 DIAGNOSIS — I4891 Unspecified atrial fibrillation: Secondary | ICD-10-CM | POA: Diagnosis not present

## 2022-01-15 DIAGNOSIS — Z7984 Long term (current) use of oral hypoglycemic drugs: Secondary | ICD-10-CM | POA: Diagnosis not present

## 2022-01-15 DIAGNOSIS — N183 Chronic kidney disease, stage 3 unspecified: Secondary | ICD-10-CM | POA: Insufficient documentation

## 2022-01-15 DIAGNOSIS — Z79899 Other long term (current) drug therapy: Secondary | ICD-10-CM | POA: Insufficient documentation

## 2022-01-15 DIAGNOSIS — R55 Syncope and collapse: Secondary | ICD-10-CM | POA: Insufficient documentation

## 2022-01-15 DIAGNOSIS — Z7901 Long term (current) use of anticoagulants: Secondary | ICD-10-CM | POA: Insufficient documentation

## 2022-01-15 DIAGNOSIS — I428 Other cardiomyopathies: Secondary | ICD-10-CM | POA: Diagnosis not present

## 2022-01-15 DIAGNOSIS — Z9221 Personal history of antineoplastic chemotherapy: Secondary | ICD-10-CM | POA: Insufficient documentation

## 2022-01-15 DIAGNOSIS — I480 Paroxysmal atrial fibrillation: Secondary | ICD-10-CM | POA: Diagnosis not present

## 2022-01-15 DIAGNOSIS — I5022 Chronic systolic (congestive) heart failure: Secondary | ICD-10-CM

## 2022-01-15 LAB — BASIC METABOLIC PANEL
Anion gap: 11 (ref 5–15)
BUN: 17 mg/dL (ref 8–23)
CO2: 18 mmol/L — ABNORMAL LOW (ref 22–32)
Calcium: 9.4 mg/dL (ref 8.9–10.3)
Chloride: 105 mmol/L (ref 98–111)
Creatinine, Ser: 1.51 mg/dL — ABNORMAL HIGH (ref 0.61–1.24)
GFR, Estimated: 49 mL/min — ABNORMAL LOW (ref >60)
Glucose, Bld: 95 mg/dL (ref 70–99)
Potassium: 3.9 mmol/L (ref 3.5–5.1)
Sodium: 134 mmol/L — ABNORMAL LOW (ref 135–145)

## 2022-01-15 LAB — BRAIN NATRIURETIC PEPTIDE: B Natriuretic Peptide: 1278.8 pg/mL — ABNORMAL HIGH (ref 0.0–100.0)

## 2022-01-15 MED ORDER — POTASSIUM CHLORIDE ER 10 MEQ PO TBCR: 10.0000 meq | EXTENDED_RELEASE_TABLET | Freq: Every day | ORAL | 3 refills | Status: DC

## 2022-01-15 MED ORDER — FUROSEMIDE 20 MG PO TABS: 20.0000 mg | ORAL_TABLET | Freq: Every day | ORAL | 3 refills | Status: DC

## 2022-01-15 MED ORDER — AMIODARONE HCL 200 MG PO TABS: ORAL_TABLET | ORAL | 3 refills | Status: DC

## 2022-01-15 NOTE — Patient Instructions (Signed)
Medication Changes: ? ?Start Amiodarone '200mg'$  Twice daily for 14 days, then take '200mg'$  daily. ? ?Changes lasix to '40mg'$  daily for 3 days, then '20mg'$  daily after that ? ?Lab Work: ? ?Labs done today, your results will be available in MyChart, we will contact you for abnormal readings. ? ? ?Testing/Procedures: ? ? ?You are scheduled for a TEE/Cardioversion/TEE Cardioversion on Friday April 28th  with Dr. [name].  Please arrive at the [facility] (Main Entrance A) at [facility]: [address] [place], [place] [address] at 09:30 am. (1 hour prior to procedure) ? ?DIET: Nothing to eat or drink after midnight except a sip of water with medications (see medication instructions below) ? ?FYI: For your safety, and to allow [place] to monitor your vital signs accurately during the surgery/procedure we request that   ?if you have artificial nails, gel coating, SNS etc. Please have those removed prior to your surgery/procedure. Not having the nail coverings /polish removed may result in cancellation or delay of your surgery/procedure. ? ? ?Medication Instructions: ?Hold Lasix morning of procedure ? ?Continue your anticoagulant: Eiquis  ?You will need to continue your anticoagulant after your procedure until you  are told by your  ?Provider that it is safe to stop ? ? ?Labs: If patient is on Coumadin, patient needs pt/INR, CBC, BMET within 3 days (No pt/INR needed for patients taking Xarelto, Eliquis, Pradaxa) ?For patients receiving anesthesia for TEE and all Cardioversion patients: BMET, CBC within 1 week ? ?You must have a responsible person to drive you home and stay in the waiting area during your procedure. Failure to do so could result in cancellation. ? ?Interior and spatial designer cards. ? ?*Special Note: Every effort is made to have your procedure done on time. Occasionally there are emergencies that occur at the hospital that may cause delays. Please be patient if a delay does occur.  ? ? ?Referrals: ? ?You have been  referred to Dr. Curt Bears  for an Ablation ? ? ?Special Instructions // Education: ? ?none ? ?Follow-Up in: 3 weeks  ? ?At the [facility], you and your health needs are our priority. We have a designated team specialized in the treatment of Heart Failure. This Care Team includes your primary Heart Failure Specialized Cardiologist (physician), Advanced Practice Providers (APPs- Physician Assistants and Nurse Practitioners), and Pharmacist who all work together to provide you with the care you need, when you need it.  ? ?You may see any of the following providers on your designated Care Team at your next follow up: ? ?Dr Glori Bickers ?Dr [name] ?Darrick Grinder, NP ?Lyda Jester, PA ?Jessica Milford,NP ?Marlyce Huge, PA ?Audry Riles, PharmD ? ? ?Please be sure to bring in all your medications bottles to every appointment.  ? ?Need to Contact us: ? ?If you have any questions or concerns before your next appointment please send [place] a message through [place] or call our office at [phone].   ? ?TO LEAVE A MESSAGE FOR THE NURSE SELECT OPTION 2, PLEASE LEAVE A MESSAGE INCLUDING: ?YOUR NAME ?DATE OF BIRTH ?CALL BACK NUMBER ?REASON FOR CALL**this is important as we prioritize the call backs ? ?YOU WILL RECEIVE A CALL BACK THE SAME DAY AS LONG AS YOU CALL BEFORE 4:00 PM ? ? ?
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
## 2022-01-18 ENCOUNTER — Ambulatory Visit (HOSPITAL_BASED_OUTPATIENT_CLINIC_OR_DEPARTMENT_OTHER): Payer: No Typology Code available for payment source | Admitting: Certified Registered Nurse Anesthetist

## 2022-01-18 ENCOUNTER — Ambulatory Visit (HOSPITAL_COMMUNITY)
Admission: RE | Admit: 2022-01-18 | Discharge: 2022-01-18 | Disposition: A | Discharge status: Home / Self Care | Payer: No Typology Code available for payment source | Attending: Cardiology | Admitting: Cardiology

## 2022-01-18 ENCOUNTER — Other Ambulatory Visit: Payer: Self-pay

## 2022-01-18 ENCOUNTER — Encounter (HOSPITAL_COMMUNITY)
Admission: RE | Disposition: A | Discharge status: Home / Self Care | Payer: Self-pay | Source: Home / Self Care | Attending: Cardiology

## 2022-01-18 ENCOUNTER — Encounter (HOSPITAL_COMMUNITY): Payer: Self-pay | Admitting: Cardiology

## 2022-01-18 ENCOUNTER — Ambulatory Visit (HOSPITAL_COMMUNITY): Payer: No Typology Code available for payment source | Admitting: Certified Registered Nurse Anesthetist

## 2022-01-18 DIAGNOSIS — Z8616 Personal history of COVID-19: Secondary | ICD-10-CM | POA: Diagnosis not present

## 2022-01-18 DIAGNOSIS — Z7901 Long term (current) use of anticoagulants: Secondary | ICD-10-CM | POA: Insufficient documentation

## 2022-01-18 DIAGNOSIS — F418 Other specified anxiety disorders: Secondary | ICD-10-CM | POA: Diagnosis not present

## 2022-01-18 DIAGNOSIS — Z79899 Other long term (current) drug therapy: Secondary | ICD-10-CM | POA: Diagnosis not present

## 2022-01-18 DIAGNOSIS — Z95810 Presence of automatic (implantable) cardiac defibrillator: Secondary | ICD-10-CM | POA: Diagnosis not present

## 2022-01-18 DIAGNOSIS — Z7984 Long term (current) use of oral hypoglycemic drugs: Secondary | ICD-10-CM | POA: Diagnosis not present

## 2022-01-18 DIAGNOSIS — I4891 Unspecified atrial fibrillation: Secondary | ICD-10-CM

## 2022-01-18 DIAGNOSIS — I428 Other cardiomyopathies: Secondary | ICD-10-CM | POA: Diagnosis not present

## 2022-01-18 DIAGNOSIS — I509 Heart failure, unspecified: Secondary | ICD-10-CM | POA: Diagnosis not present

## 2022-01-18 DIAGNOSIS — N183 Chronic kidney disease, stage 3 unspecified: Secondary | ICD-10-CM | POA: Diagnosis not present

## 2022-01-18 DIAGNOSIS — Z87891 Personal history of nicotine dependence: Secondary | ICD-10-CM | POA: Diagnosis not present

## 2022-01-18 DIAGNOSIS — I5022 Chronic systolic (congestive) heart failure: Secondary | ICD-10-CM | POA: Diagnosis not present

## 2022-01-18 DIAGNOSIS — Z8572 Personal history of non-Hodgkin lymphomas: Secondary | ICD-10-CM | POA: Insufficient documentation

## 2022-01-18 DIAGNOSIS — Z9221 Personal history of antineoplastic chemotherapy: Secondary | ICD-10-CM | POA: Diagnosis not present

## 2022-01-18 DIAGNOSIS — G4733 Obstructive sleep apnea (adult) (pediatric): Secondary | ICD-10-CM | POA: Diagnosis not present

## 2022-01-18 DIAGNOSIS — G4730 Sleep apnea, unspecified: Secondary | ICD-10-CM | POA: Diagnosis not present

## 2022-01-18 HISTORY — DX: Sleep apnea, unspecified: G47.30

## 2022-01-18 HISTORY — PX: CARDIOVERSION: SHX1299

## 2022-01-18 SURGERY — CARDIOVERSION
Anesthesia: General

## 2022-01-18 MED ORDER — SODIUM CHLORIDE 0.9 % IV SOLN
INTRAVENOUS | Status: DC | PRN
Start: 2022-01-18 — End: 2022-01-18

## 2022-01-18 MED ORDER — ETOMIDATE 2 MG/ML IV SOLN
INTRAVENOUS | Status: DC | PRN
  Administered 2022-01-18: 10 mg via INTRAVENOUS

## 2022-01-18 MED ORDER — PHENYLEPHRINE 80 MCG/ML (10ML) SYRINGE FOR IV PUSH (FOR BLOOD PRESSURE SUPPORT)
PREFILLED_SYRINGE | INTRAVENOUS | Status: DC | PRN
  Administered 2022-01-18 (×2): 160 ug via INTRAVENOUS
  Administered 2022-01-18: 240 ug via INTRAVENOUS

## 2022-01-18 MED ORDER — LIDOCAINE 2% (20 MG/ML) 5 ML SYRINGE
INTRAMUSCULAR | Status: DC | PRN
  Administered 2022-01-18: 80 mg via INTRAVENOUS

## 2022-01-18 MED ORDER — PROPOFOL 10 MG/ML IV BOLUS
INTRAVENOUS | Status: DC | PRN
  Administered 2022-01-18: 30 mg via INTRAVENOUS
  Administered 2022-01-18: 10 mg via INTRAVENOUS

## 2022-01-18 MED ORDER — EPHEDRINE SULFATE-NACL 50-0.9 MG/10ML-% IV SOSY
PREFILLED_SYRINGE | INTRAVENOUS | Status: DC | PRN
  Administered 2022-01-18: 15 mg via INTRAVENOUS

## 2022-01-18 MED ORDER — SODIUM CHLORIDE 0.9 % IV SOLN: INTRAVENOUS | Status: DC

## 2022-01-18 NOTE — Anesthesia Postprocedure Evaluation (Signed)
Anesthesia Post Note ? ?Patient: Jon Oconnell ? ?Procedure(s) Performed: CARDIOVERSION ? ?  ? ?Patient location during evaluation: PACU ?Anesthesia Type: General ?Level of consciousness: awake and alert ?Pain management: pain level controlled ?Vital Signs Assessment: post-procedure vital signs reviewed and stable ?Respiratory status: spontaneous breathing, nonlabored ventilation and respiratory function stable ?Cardiovascular status: blood pressure returned to baseline ?Postop Assessment: no apparent nausea or vomiting ?Anesthetic complications: no ? ? ?No notable events documented. ? ?Last Vitals:  ?Vitals:  ? 01/18/22 1105 01/18/22 1115  ?BP: 100/84   ?Pulse: (!) 114 (!) 110  ?Resp: 13 15  ?Temp:    ?SpO2: 99% 100%  ?  ?Last Pain:  ?Vitals:  ? 01/18/22 1115  ?TempSrc:   ?PainSc: 0-No pain  ? ? ?  ?  ?  ?  ?  ?  ? ?Marthenia Rolling ? ? ? ? ?

## 2022-01-18 NOTE — Transfer of Care (Signed)
Immediate Anesthesia Transfer of Care Note ? ?Patient: Jon Oconnell ? ?Procedure(s) Performed: CARDIOVERSION ? ?Patient Location: PACU ? ?Anesthesia Type:MAC ? ?Level of Consciousness: drowsy and patient cooperative ? ?Airway & Oxygen Therapy: Patient Spontanous Breathing ? ?Post-op Assessment: Report given to RN and Post -op Vital signs reviewed and stable ? ?Post vital signs: Reviewed and stable ? ?Last Vitals:  ?Vitals Value Taken Time  ?BP    ?Temp    ?Pulse    ?Resp    ?SpO2    ? ? ?Last Pain:  ?Vitals:  ? 01/18/22 0827  ?TempSrc: Temporal  ?PainSc: 0-No pain  ?   ? ?  ? ?Complications: No notable events documented. ?

## 2022-01-18 NOTE — Anesthesia Preprocedure Evaluation (Addendum)
Anesthesia Evaluation  ?Patient identified by MRN, date of birth, ID band ?Patient awake ? ? ? ?Reviewed: ?Allergy & Precautions, NPO status , Patient's Chart, lab work & pertinent test results, reviewed documented beta blocker date and time  ? ?History of Anesthesia Complications ?Negative for: history of anesthetic complications ? ?Airway ?Mallampati: II ? ?TM Distance: >3 FB ?Neck ROM: Full ? ? ? Dental ?no notable dental hx. ? ?  ?Pulmonary ?sleep apnea , former smoker,  ?  ?Pulmonary exam normal ? ? ? ? ? ? ? Cardiovascular ?Pt. on home beta blockers ?+CHF  ?Normal cardiovascular exam+ dysrhythmias Atrial Fibrillation  ? ?TTE 10/26/21: EF 25-30%, global hypokinesis, mild LVE, grade II DD, RV systolic function mildly reduced, moderately elevated PASP 50.5 mmHg, moderate LAE, mild RAE, mild MR ?  ?Neuro/Psych ?Anxiety Depression negative neurological ROS ?   ? GI/Hepatic ?negative GI ROS, Neg liver ROS,   ?Endo/Other  ?negative endocrine ROS ? Renal/GU ?negative Renal ROS  ?negative genitourinary ?  ?Musculoskeletal ?negative musculoskeletal ROS ?(+)  ? Abdominal ?  ?Peds ? Hematology ?negative hematology ROS ?(+)   ?Anesthesia Other Findings ?Day of surgery medications reviewed with patient. ? Reproductive/Obstetrics ?negative OB ROS ? ?  ? ? ? ? ? ? ? ? ? ? ? ? ? ?  ?  ? ? ? ? ? ? ? ?Anesthesia Physical ?Anesthesia Plan ? ?ASA: 4 ? ?Anesthesia Plan: General  ? ?Post-op Pain Management: Minimal or no pain anticipated  ? ?Induction: Intravenous ? ?PONV Risk Score and Plan: Treatment may vary due to age or medical condition and Propofol infusion ? ?Airway Management Planned: Mask ? ?Additional Equipment: None ? ?Intra-op Plan:  ? ?Post-operative Plan:  ? ?Informed Consent: I have reviewed the patients History and Physical, chart, labs and discussed the procedure including the risks, benefits and alternatives for the proposed anesthesia with the patient or authorized representative  who has indicated his/her understanding and acceptance.  ? ? ? ? ? ?Plan Discussed with: CRNA ? ?Anesthesia Plan Comments:   ? ? ? ? ? ? ?Anesthesia Quick Evaluation ? ?

## 2022-01-18 NOTE — Discharge Instructions (Signed)

## 2022-01-18 NOTE — Interval H&P Note (Signed)
History and Physical Interval Note: ? ?01/18/2022 ?10:17 AM ? ?Jon Oconnell  has presented today for surgery, with the diagnosis of a fib.  The various methods of treatment have been discussed with the patient and family. After consideration of risks, benefits and other options for treatment, the patient has consented to  Procedure(s): ?CARDIOVERSION (N/A) as a surgical intervention.  The patient's history has been reviewed, patient examined, no change in status, stable for surgery.  I have reviewed the patient's chart and labs.  Questions were answered to the patient's satisfaction.   ? ? ?[name] [name] ? ? ?

## 2022-01-18 NOTE — Procedures (Signed)
Electrical Cardioversion Procedure Note ?Jon Oconnell ?[id number] ?February 24, 1951 ? ?Procedure: Electrical Cardioversion ?Indications:  Atrial Fibrillation ? ?Procedure Details ?Consent: Risks of procedure as well as the alternatives and risks of each were explained to the (patient/caregiver).  Consent for procedure obtained. ?Time Out: Verified patient identification, verified procedure, site/side was marked, verified correct patient position, special equipment/implants available, medications/allergies/relevent history reviewed, required imaging and test results available.  Performed ? ?Patient placed on cardiac monitor, pulse oximetry, supplemental oxygen as necessary.  ?Sedation given:  Propofol per anesthesiology ?Pacer pads placed anterior and posterior chest. ? ?Cardioverted 1 time(s).  ?Cardioverted at 200J. ? ?Evaluation ?Findings: Post procedure EKG shows: NSR ?Complications: None ?Patient did tolerate procedure well. ? ? ?Jon Oconnell ?01/18/2022, 10:21 AM ? ? ? ?

## 2022-01-18 NOTE — Anesthesia Procedure Notes (Signed)
Procedure Name: General with mask airway ?Date/Time: 01/18/2022 10:13 AM ?Performed by: Harden Mo, CRNA ?Pre-anesthesia Checklist: Patient identified, Emergency Drugs available, Suction available and Patient being monitored ?Patient Re-evaluated:Patient Re-evaluated prior to induction ?Oxygen Delivery Method: Ambu bag ?Preoxygenation: Pre-oxygenation with 100% oxygen ?Induction Type: IV induction ?Placement Confirmation: positive ETCO2 and breath sounds checked- equal and bilateral ?Dental Injury: Teeth and Oropharynx as per pre-operative assessment  ? ? ? ? ?

## 2022-01-21 ENCOUNTER — Other Ambulatory Visit: Payer: Self-pay | Admitting: Cardiology

## 2022-01-28 ENCOUNTER — Telehealth: Payer: Self-pay | Admitting: Cardiology

## 2022-01-28 NOTE — Telephone Encounter (Signed)
Pt c/o Shortness Of Breath: STAT if SOB developed within the last 24 hours or pt is noticeably SOB on the phone ? ?1. Are you currently SOB (can you hear that pt is SOB on the phone)? Yes, could not hear over the phone.  ? ?2. How long have you been experiencing SOB? Since he had procedure with Dr [name] ? ?3. Are you SOB when sitting or when up moving around? Yes, both ? ?4. Are you currently experiencing any other symptoms? Can't sleep at night   ? ?Transferred to Dr Claris Gladden office to discuss symptoms due to him not speaking to Dr [name] yet.  ?

## 2022-02-01 ENCOUNTER — Ambulatory Visit (INDEPENDENT_AMBULATORY_CARE_PROVIDER_SITE_OTHER): Payer: No Typology Code available for payment source

## 2022-02-01 DIAGNOSIS — I5022 Chronic systolic (congestive) heart failure: Secondary | ICD-10-CM

## 2022-02-01 DIAGNOSIS — I428 Other cardiomyopathies: Secondary | ICD-10-CM

## 2022-02-04 ENCOUNTER — Telehealth (HOSPITAL_COMMUNITY): Payer: Self-pay

## 2022-02-04 LAB — CUP PACEART REMOTE DEVICE CHECK
Battery Remaining Longevity: 38 mo
Battery Voltage: 2.94 V
Brady Statistic RV Percent Paced: 0 %
Date Time Interrogation Session: 20230513142604
HighPow Impedance: 59 Ohm
Implantable Lead Implant Date: 20140818
Implantable Lead Location: 753860
Implantable Pulse Generator Implant Date: 20140818
Lead Channel Impedance Value: 323 Ohm
Lead Channel Impedance Value: 380 Ohm
Lead Channel Pacing Threshold Amplitude: 0.75 V
Lead Channel Pacing Threshold Pulse Width: 0.4 ms
Lead Channel Sensing Intrinsic Amplitude: 10.625 mV
Lead Channel Sensing Intrinsic Amplitude: 10.625 mV
Lead Channel Setting Pacing Amplitude: 2 V
Lead Channel Setting Pacing Pulse Width: 0.4 ms
Lead Channel Setting Sensing Sensitivity: 0.3 mV
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
## 2022-02-04 NOTE — Telephone Encounter (Signed)
Called and left a voice message to confirm/remind patient of their appointment at the [facility] on 02/05/22.  ? ?A message was also left reminding to bring all medications and/or complete list. ? ? ? ?

## 2022-02-05 ENCOUNTER — Ambulatory Visit (HOSPITAL_COMMUNITY)
Admission: RE | Admit: 2022-02-05 | Discharge: 2022-02-05 | Disposition: A | Discharge status: Home / Self Care | Payer: No Typology Code available for payment source | Source: Ambulatory Visit | Attending: Family Medicine | Admitting: Family Medicine

## 2022-02-05 ENCOUNTER — Encounter: Payer: Self-pay | Admitting: Cardiology

## 2022-02-05 ENCOUNTER — Encounter (HOSPITAL_COMMUNITY): Payer: Self-pay

## 2022-02-05 VITALS — BP 92/70 | HR 93 | Wt 183.6 lb

## 2022-02-05 DIAGNOSIS — I428 Other cardiomyopathies: Secondary | ICD-10-CM | POA: Insufficient documentation

## 2022-02-05 DIAGNOSIS — I4891 Unspecified atrial fibrillation: Secondary | ICD-10-CM | POA: Diagnosis not present

## 2022-02-05 DIAGNOSIS — N183 Chronic kidney disease, stage 3 unspecified: Secondary | ICD-10-CM | POA: Diagnosis not present

## 2022-02-05 DIAGNOSIS — Y92481 Parking lot as the place of occurrence of the external cause: Secondary | ICD-10-CM | POA: Insufficient documentation

## 2022-02-05 DIAGNOSIS — R55 Syncope and collapse: Secondary | ICD-10-CM | POA: Insufficient documentation

## 2022-02-05 DIAGNOSIS — I5022 Chronic systolic (congestive) heart failure: Secondary | ICD-10-CM

## 2022-02-05 DIAGNOSIS — Z7984 Long term (current) use of oral hypoglycemic drugs: Secondary | ICD-10-CM | POA: Insufficient documentation

## 2022-02-05 DIAGNOSIS — Z79899 Other long term (current) drug therapy: Secondary | ICD-10-CM | POA: Insufficient documentation

## 2022-02-05 DIAGNOSIS — Z8572 Personal history of non-Hodgkin lymphomas: Secondary | ICD-10-CM | POA: Insufficient documentation

## 2022-02-05 DIAGNOSIS — N1831 Chronic kidney disease, stage 3a: Secondary | ICD-10-CM

## 2022-02-05 DIAGNOSIS — Q245 Malformation of coronary vessels: Secondary | ICD-10-CM | POA: Diagnosis not present

## 2022-02-05 DIAGNOSIS — I4819 Other persistent atrial fibrillation: Secondary | ICD-10-CM | POA: Diagnosis not present

## 2022-02-05 DIAGNOSIS — Z87898 Personal history of other specified conditions: Secondary | ICD-10-CM

## 2022-02-05 DIAGNOSIS — Z7901 Long term (current) use of anticoagulants: Secondary | ICD-10-CM | POA: Diagnosis not present

## 2022-02-05 LAB — BASIC METABOLIC PANEL
Anion gap: 11 (ref 5–15)
BUN: 24 mg/dL — ABNORMAL HIGH (ref 8–23)
CO2: 19 mmol/L — ABNORMAL LOW (ref 22–32)
Calcium: 9.2 mg/dL (ref 8.9–10.3)
Chloride: 106 mmol/L (ref 98–111)
Creatinine, Ser: 1.78 mg/dL — ABNORMAL HIGH (ref 0.61–1.24)
GFR, Estimated: 40 mL/min — ABNORMAL LOW (ref >60)
Glucose, Bld: 137 mg/dL — ABNORMAL HIGH (ref 70–99)
Potassium: 4.7 mmol/L (ref 3.5–5.1)
Sodium: 136 mmol/L (ref 135–145)

## 2022-02-05 LAB — BRAIN NATRIURETIC PEPTIDE: B Natriuretic Peptide: 2204.1 pg/mL — ABNORMAL HIGH (ref 0.0–100.0)

## 2022-02-05 MED ORDER — POTASSIUM CHLORIDE ER 10 MEQ PO TBCR: 20.0000 meq | EXTENDED_RELEASE_TABLET | Freq: Every day | ORAL | 3 refills | Status: DC

## 2022-02-05 MED ORDER — FUROSEMIDE 10 MG/ML IJ SOLN
80.0000 mg | Freq: Once | INTRAMUSCULAR | Status: AC
  Administered 2022-02-05: 80 mg via INTRAVENOUS

## 2022-02-05 MED ORDER — FUROSEMIDE 20 MG PO TABS: 40.0000 mg | ORAL_TABLET | Freq: Every day | ORAL | 3 refills | Status: DC

## 2022-02-05 MED ORDER — POTASSIUM CHLORIDE CRYS ER 20 MEQ PO TBCR
40.0000 meq | EXTENDED_RELEASE_TABLET | Freq: Once | ORAL | Status: AC
  Administered 2022-02-05: 40 meq via ORAL

## 2022-02-05 NOTE — Addendum Note (Signed)
Encounter addended by: Payton Mccallum, RN on: 02/05/2022 1:50 PM ? Actions taken: Order list changed

## 2022-02-05 NOTE — Addendum Note (Signed)
Encounter addended by: Payton Mccallum, RN on: 02/05/2022 11:49 AM ? Actions taken: Flowsheet accepted

## 2022-02-05 NOTE — Addendum Note (Signed)
Encounter addended by: Payton Mccallum, RN on: 02/05/2022 10:57 AM ? Actions taken: Order list changed, Diagnosis association updated, LDA properties accepted, MAR administration accepted

## 2022-02-05 NOTE — Patient Instructions (Addendum)
Thank you for coming in today ? ?Labs were done today, if any labs are abnormal the clinic will call you ?No news is good news ? ?You received 80 mg IV and 40 meq of Potassium oral ? ?HOLD todays dose of lasix ? ?INCREASE Lasix to 40 mg daily  ?INCREASE Potassium to 20 meq daily  ? ?Your physician recommends that you schedule a follow-up appointment in:  ?02/12/2022 at [place] in clinic ? ?At the [facility], you and your health needs are our priority. As part of our continuing mission to provide you with exceptional heart care, we have created designated Provider Care Teams. These Care Teams include your primary Cardiologist (physician) and Advanced Practice Providers (APPs- Physician Assistants and Nurse Practitioners) who all work together to provide you with the care you need, when you need it.  ? ?You may see any of the following providers on your designated Care Team at your next follow up: ?Dr Glori Bickers ?Dr [name] ?Darrick Grinder, NP ?Lyda Jester, PA ?Jessica Milford,NP ?Marlyce Huge, PA ?Audry Riles, PharmD ? ? ?Please be sure to bring in all your medications bottles to every appointment.  ? ?If you have any questions or concerns before your next appointment please send [place] a message through [place] or call our office at [phone].   ? ?TO LEAVE A MESSAGE FOR THE NURSE SELECT OPTION 2, PLEASE LEAVE A MESSAGE INCLUDING: ?YOUR NAME ?DATE OF BIRTH ?CALL BACK NUMBER ?REASON FOR CALL**this is important as we prioritize the call backs ? ?YOU WILL RECEIVE A CALL BACK THE SAME DAY AS LONG AS YOU CALL BEFORE 4:00 PM ? ?

## 2022-02-05 NOTE — Addendum Note (Signed)
Encounter addended by: Payton Mccallum, RN on: 02/05/2022 11:40 AM ? Actions taken: Charge Capture section accepted, Flowsheet accepted, Order list changed, Clinical Note Signed

## 2022-02-07 NOTE — Progress Notes (Signed)
Remote ICD transmission.   

## 2022-02-12 ENCOUNTER — Ambulatory Visit (HOSPITAL_COMMUNITY)
Admission: RE | Admit: 2022-02-12 | Discharge: 2022-02-12 | Disposition: A | Discharge status: Home / Self Care | Payer: No Typology Code available for payment source | Source: Ambulatory Visit | Attending: Family Medicine | Admitting: Family Medicine

## 2022-02-12 ENCOUNTER — Encounter (HOSPITAL_COMMUNITY): Payer: Self-pay

## 2022-02-12 VITALS — BP 80/68 | HR 93 | Wt 174.6 lb

## 2022-02-12 DIAGNOSIS — G4733 Obstructive sleep apnea (adult) (pediatric): Secondary | ICD-10-CM | POA: Insufficient documentation

## 2022-02-12 DIAGNOSIS — N183 Chronic kidney disease, stage 3 unspecified: Secondary | ICD-10-CM | POA: Diagnosis not present

## 2022-02-12 DIAGNOSIS — Z7901 Long term (current) use of anticoagulants: Secondary | ICD-10-CM | POA: Diagnosis not present

## 2022-02-12 DIAGNOSIS — I4891 Unspecified atrial fibrillation: Secondary | ICD-10-CM | POA: Insufficient documentation

## 2022-02-12 DIAGNOSIS — Q245 Malformation of coronary vessels: Secondary | ICD-10-CM

## 2022-02-12 DIAGNOSIS — Z8572 Personal history of non-Hodgkin lymphomas: Secondary | ICD-10-CM | POA: Diagnosis not present

## 2022-02-12 DIAGNOSIS — I5022 Chronic systolic (congestive) heart failure: Secondary | ICD-10-CM | POA: Diagnosis present

## 2022-02-12 DIAGNOSIS — G4730 Sleep apnea, unspecified: Secondary | ICD-10-CM | POA: Diagnosis not present

## 2022-02-12 DIAGNOSIS — Z95810 Presence of automatic (implantable) cardiac defibrillator: Secondary | ICD-10-CM | POA: Insufficient documentation

## 2022-02-12 DIAGNOSIS — Z79899 Other long term (current) drug therapy: Secondary | ICD-10-CM | POA: Diagnosis not present

## 2022-02-12 DIAGNOSIS — Z7984 Long term (current) use of oral hypoglycemic drugs: Secondary | ICD-10-CM | POA: Insufficient documentation

## 2022-02-12 DIAGNOSIS — N1831 Chronic kidney disease, stage 3a: Secondary | ICD-10-CM | POA: Diagnosis not present

## 2022-02-12 DIAGNOSIS — Z8616 Personal history of COVID-19: Secondary | ICD-10-CM | POA: Insufficient documentation

## 2022-02-12 DIAGNOSIS — I4819 Other persistent atrial fibrillation: Secondary | ICD-10-CM

## 2022-02-12 DIAGNOSIS — I428 Other cardiomyopathies: Secondary | ICD-10-CM | POA: Insufficient documentation

## 2022-02-12 DIAGNOSIS — Z87898 Personal history of other specified conditions: Secondary | ICD-10-CM

## 2022-02-12 LAB — BASIC METABOLIC PANEL
Anion gap: 8 (ref 5–15)
BUN: 12 mg/dL (ref 8–23)
CO2: 25 mmol/L (ref 22–32)
Calcium: 9.1 mg/dL (ref 8.9–10.3)
Chloride: 103 mmol/L (ref 98–111)
Creatinine, Ser: 1.64 mg/dL — ABNORMAL HIGH (ref 0.61–1.24)
GFR, Estimated: 44 mL/min — ABNORMAL LOW (ref >60)
Glucose, Bld: 88 mg/dL (ref 70–99)
Potassium: 4.1 mmol/L (ref 3.5–5.1)
Sodium: 136 mmol/L (ref 135–145)

## 2022-02-12 LAB — BRAIN NATRIURETIC PEPTIDE: B Natriuretic Peptide: 1513.8 pg/mL — ABNORMAL HIGH (ref 0.0–100.0)

## 2022-02-12 MED ORDER — METOPROLOL SUCCINATE ER 25 MG PO TB24: 12.5000 mg | ORAL_TABLET | Freq: Every day | ORAL | 3 refills | Status: DC

## 2022-02-12 NOTE — Patient Instructions (Addendum)
STOP Losartan CHANGE Metoprolol to 12.5 mg (one half tab) nightly at bedtime  Labs today We will only contact you if something comes back abnormal or we need to make some changes. Otherwise no news is good news!  Your physician has requested that you regularly monitor and record your blood pressure readings at home. Please use the same machine at the same time of day to check your readings and record them to bring to your follow-up visit. -please give our office a call if your systolic blood pressure (top number) is consistently below 80.  You have been referred to [facility] with Dr Radford Pax Regarding your CPAP accessories, they will be in contact with an appointment.  Your physician recommends that you schedule a follow-up appointment in: 3-4 weeks  in the Advanced Practitioners (PA/NP) Clinic and in 2-3 months with Dr [name]  Do the following things EVERYDAY: Weigh yourself in the morning before breakfast. Write it down and keep it in a log. Take your medicines as prescribed Eat low salt foods--Limit salt (sodium) to 2000 mg per day.  Stay as active as you can everyday Limit all fluids for the day to less than 2 liters  At the [facility], you and your health needs are our priority. As part of our continuing mission to provide you with exceptional heart care, we have created designated Provider Care Teams. These Care Teams include your primary Cardiologist (physician) and Advanced Practice Providers (APPs- Physician Assistants and Nurse Practitioners) who all work together to provide you with the care you need, when you need it.   You may see any of the following providers on your designated Care Team at your next follow up: Dr Glori Bickers Dr Haynes Kerns, NP Lyda Jester, [place] [facility] [place], [place] Audry Riles, PharmD   Please be sure to bring in all your medications bottles to every appointment.   If you have any  questions or concerns before your next appointment please send [place] a message through [place] or call our office at [phone].    TO LEAVE A MESSAGE FOR THE NURSE SELECT OPTION 2, PLEASE LEAVE A MESSAGE INCLUDING: YOUR NAME DATE OF BIRTH CALL BACK NUMBER REASON FOR CALL**this is important as we prioritize the call backs  YOU WILL RECEIVE A CALL BACK THE SAME DAY AS LONG AS YOU CALL BEFORE 4:00 PM  l
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
## 2022-02-21 ENCOUNTER — Encounter: Payer: Self-pay | Admitting: *Deleted

## 2022-02-21 ENCOUNTER — Ambulatory Visit (INDEPENDENT_AMBULATORY_CARE_PROVIDER_SITE_OTHER): Payer: Medicare PPO | Admitting: Cardiology

## 2022-02-21 ENCOUNTER — Encounter: Payer: Self-pay | Admitting: Cardiology

## 2022-02-21 VITALS — BP 98/62 | HR 100 | Ht 69.0 in | Wt 173.8 lb

## 2022-02-21 DIAGNOSIS — I4819 Other persistent atrial fibrillation: Secondary | ICD-10-CM

## 2022-02-21 DIAGNOSIS — D6869 Other thrombophilia: Secondary | ICD-10-CM | POA: Diagnosis not present

## 2022-02-21 DIAGNOSIS — I4891 Unspecified atrial fibrillation: Secondary | ICD-10-CM | POA: Diagnosis not present

## 2022-02-21 NOTE — Patient Instructions (Signed)
Medication Instructions:  Your physician recommends that you continue on your current medications as directed. Please refer to the Current Medication list given to you today.  *If you need a refill on your cardiac medications before your next appointment, please call your pharmacy*   Lab Work: Pre procedure labs - See procedure instruction letter:  BMP & CBC  If you have labs (blood work) drawn today and your tests are completely normal, you will receive your results only by: [place] (if you have MyChart) OR A paper copy in the mail If you have any lab test that is abnormal or we need to change your treatment, we will call you to review the results.   Testing/Procedures: Your physician has requested that you have cardiac CT within 7 days PRIOR to your ablation. Cardiac computed tomography (CT) is a painless test that uses an x-ray machine to take clear, detailed pictures of your heart.  Please follow instruction below located under "other instructions". You will get a call from our office to schedule the date for this test.  Your physician has recommended that you have an ablation. Catheter ablation is a medical procedure used to treat some cardiac arrhythmias (irregular heartbeats). During catheter ablation, a long, thin, flexible tube is put into a blood vessel in your groin (upper thigh), or neck. This tube is called an ablation catheter. It is then guided to your heart through the blood vessel. Radio frequency waves destroy small areas of heart tissue where abnormal heartbeats may cause an arrhythmia to start. Please follow instruction letter given to you today.   Follow-Up: At [facility], you and your health needs are our priority.  As part of our continuing mission to provide you with exceptional heart care, we have created designated Provider Care Teams.  These Care Teams include your primary Cardiologist (physician) and Advanced Practice Providers (APPs -  Physician  Assistants and Nurse Practitioners) who all work together to provide you with the care you need, when you need it.  Your next appointment:   1 month(s) after your ablation  The format for your next appointment:   In Person  Provider:   [facility] clinic   Thank you for choosing [facility]!!   Trinidad Curet, RN [phone]    Other Instructions   Cardiac Ablation Cardiac ablation is a procedure to destroy (ablate) some heart tissue that is sending bad signals. These bad signals cause problems in heart rhythm. The heart has many areas that make these signals. If there are problems in these areas, they can make the heart beat in a way that is not normal. Destroying some tissues can help make the heart rhythm normal. Tell your doctor about: Any allergies you have. All medicines you are taking. These include vitamins, herbs, eye drops, creams, and over-the-counter medicines. Any problems you or family members have had with medicines that make you fall asleep (anesthetics). Any blood disorders you have. Any surgeries you have had. Any medical conditions you have, such as kidney failure. Whether you are pregnant or may be pregnant. What are the risks? This is a safe procedure. But problems may occur, including: Infection. Bruising and bleeding. Bleeding into the chest. Stroke or blood clots. Damage to nearby areas of your body. Allergies to medicines or dyes. The need for a pacemaker if the normal system is damaged. Failure of the procedure to treat the problem. What happens before the procedure? Medicines Ask your doctor about: Changing or stopping your normal medicines. This  is important. Taking aspirin and ibuprofen. Do not take these medicines unless your doctor tells you to take them. Taking other medicines, vitamins, herbs, and supplements. General instructions Follow instructions from your doctor about what you cannot eat or drink. Plan to have someone take you  home from the hospital or clinic. If you will be going home right after the procedure, plan to have someone with you for 24 hours. Ask your doctor what steps will be taken to prevent infection. What happens during the procedure?  An IV tube will be put into one of your veins. You will be given a medicine to help you relax. The skin on your neck or groin will be numbed. A cut (incision) will be made in your neck or groin. A needle will be put through your cut and into a large vein. A tube (catheter) will be put into the needle. The tube will be moved to your heart. Dye may be put through the tube. This helps your doctor see your heart. Small devices (electrodes) on the tube will send out signals. A type of energy will be used to destroy some heart tissue. The tube will be taken out. Pressure will be held on your cut. This helps stop bleeding. A bandage will be put over your cut. The exact procedure may vary among doctors and hospitals. What happens after the procedure? You will be watched until you leave the hospital or clinic. This includes checking your heart rate, breathing rate, oxygen, and blood pressure. Your cut will be watched for bleeding. You will need to lie still for a few hours. Do not drive for 24 hours or as long as your doctor tells you. Summary Cardiac ablation is a procedure to destroy some heart tissue. This is done to treat heart rhythm problems. Tell your doctor about any medical conditions you may have. Tell him or her about all medicines you are taking to treat them. This is a safe procedure. But problems may occur. These include infection, bruising, bleeding, and damage to nearby areas of your body. Follow what your doctor tells you about food and drink. You may also be told to change or stop some of your medicines. After the procedure, do not drive for 24 hours or as long as your doctor tells you. This information is not intended to replace advice given to you by  your health care provider. Make sure you discuss any questions you have with your health care provider. Document Revised: 08/12/2019 Document Reviewed: 08/12/2019 Elsevier Patient Education  [place].

## 2022-02-21 NOTE — Progress Notes (Signed)
Electrophysiology Office Note   Date:  02/21/2022   ID:  Patty Lopezgarcia, DOB 05-Nov-1950, MRN [id number]  PC[facility]  [facility]  Cardiologist:  Marica Otter Primary Electrophysiologist:  Alejandrina Raimer Meredith Leeds, MD    Chief Complaint: CHF   History of Present Illness: Alvester Eads is a 71 y.o. male who is being seen today for the evaluation of CHF at the request of Kirk Ruths. Presenting today for electrophysiology evaluation.  He has a history significant for nonischemic cardiomyopathy, prior Hodgkin lymphoma, atrial fibrillation.  In 2014 he had a left heart catheterization that showed no obstructive coronary artery disease.  He has a Medtronic ICD.  He went to [facility] was found to have rapid atrial fibrillation.  He initially had an attempted TEE cardioversion and was found to have a left atrial appendage thrombus.  He was presented to [facility] 04/20/2019 with decompensated heart failure.  He underwent TEE cardioversion 05/13/2019.  He is status post atrial fibrillation ablation 09/16/2019.  He presented to heart failure clinic 01/15/2022 complaining of a weeks worth of palpitations.  He was short of breath when walking.  He was found to be in atrial fibrillation and is status post cardioversion 01/18/2022.  Today, denies symptoms of palpitations, chest pain, shortness of breath, orthopnea, PND, lower extremity edema, claudication, dizziness, presyncope, syncope, bleeding, or neurologic sequela. The patient is tolerating medications without difficulties.  Today he feels well.  He states that he feels much better in sinus rhythm.  He was having symptoms of weakness, fatigue, shortness of breath, fluid retention when he was in atrial fibrillation.  Since being in normal rhythm he has done much better.   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    Past Surgical History:  Procedure Laterality Date   ATRIAL FIBRILLATION ABLATION N/A 09/16/2019   Procedure: ATRIAL FIBRILLATION ABLATION;  Surgeon: Constance Haw, MD;  Location: [place] CV LAB;  Service: Cardiovascular;  Laterality: N/A;   CARDIOVERSION N/A 05/19/2019   Procedure: CARDIOVERSION;  Surgeon: Larey Dresser, MD;  Location: [facility] ENDOSCOPY;  Service: Cardiovascular;  Laterality: N/A;   CARDIOVERSION N/A 01/18/2022   Procedure: CARDIOVERSION;  Surgeon: Larey Dresser, MD;  Location: [facility] ENDOSCOPY;  Service: Cardiovascular;  Laterality: N/A;   ICD IMPLANT Left    RIGHT/LEFT HEART CATH AND CORONARY ANGIOGRAPHY N/A 04/23/2019   Procedure: RIGHT/LEFT HEART CATH AND CORONARY ANGIOGRAPHY;  Surgeon: Larey Dresser, MD;  Location: [place] CV LAB;  Service: Cardiovascular;  Laterality: N/A;   TEE WITHOUT CARDIOVERSION N/A 05/19/2019   Procedure: TRANSESOPHAGEAL ECHOCARDIOGRAM (TEE);  Surgeon: Larey Dresser, MD;  Location: [facility] ENDOSCOPY;  Service: Cardiovascular;  Laterality: N/A;     Current Outpatient Medications  Medication Sig Dispense Refill   amiodarone (PACERONE) 200 MG tablet Take 200 mg by mouth daily.     apixaban (ELIQUIS) 5 MG TABS tablet Take 1 tablet (5 mg total) by mouth 2 (two) times  daily. 60 tablet 5   empagliflozin (JARDIANCE) 10 MG TABS tablet Take 1 tablet (10 mg total) by mouth daily before breakfast. 30 tablet 11   eplerenone (INSPRA) 25 MG tablet Take 1 tablet (25 mg total) by mouth daily. 90  tablet 3   furosemide (LASIX) 20 MG tablet Take 2 tablets (40 mg total) by mouth daily. 180 tablet 3   ipratropium (ATROVENT) 0.02 % nebulizer solution Take 0.5 mg by nebulization every 6 (six) hours as needed for wheezing or shortness of breath.     metoprolol succinate (TOPROL-XL) 25 MG 24 hr tablet Take 0.5 tablets (12.5 mg total) by mouth at bedtime. 45 tablet 3   omeprazole (PRILOSEC) 20 MG capsule Take 40 mg by mouth daily.     potassium chloride (KLOR-CON) 10 MEQ tablet Take 2 tablets (20 mEq total) by mouth daily. 180 tablet 3   Rosuvastatin Calcium 20 MG CPSP Take 20 mg by mouth daily.     sertraline (ZOLOFT) 50 MG tablet Take 50 mg by mouth daily.      vitamin B-12 (CYANOCOBALAMIN) 250 MCG tablet Take 500 mcg by mouth daily.     No current facility-administered medications for this visit.    Allergies:   Patient has no known allergies.   Social History:  The patient  reports that he has quit smoking. He has never used smokeless tobacco. He reports that he does not currently use alcohol. He reports that he does not use drugs.   Family History:  The patient's family history includes Breast cancer in his mother; Stroke in his father.   ROS:  Please see the history of present illness.   Otherwise, review of systems is positive for none.   All other systems are reviewed and negative.   PHYSICAL EXAM: VS:  BP 98/62   Pulse 100   Ht '5\' 9"'$  (1.753 m)   Wt 173 lb 12.8 oz (78.8 kg)   SpO2 97%   BMI 25.67 kg/m  , BMI Body mass index is 25.67 kg/m. GEN: Well nourished, well developed, in no acute distress  HEENT: normal  Neck: no JVD, carotid bruits, or masses Cardiac: RRR; no murmurs, rubs, or gallops,no edema  Respiratory:  clear to auscultation bilaterally, normal work of breathing GI: soft, nontender, nondistended, + BS MS: no deformity or atrophy  Skin: warm and dry, device site well healed Neuro:  Strength and sensation are intact Psych: euthymic mood, full affect  EKG:   EKG is not ordered today. Personal review of the ekg ordered 02/05/22 shows sinus rhythm, left anterior fascicular block, anterior Q waves  Personal review of the device interrogation today. Results in [place]: 10/26/2021: Hemoglobin 15.6; Platelets 159 02/12/2022: B Natriuretic Peptide 1,513.8; BUN 12; Creatinine, Ser 1.64; Potassium 4.1; Sodium 136    Lipid Panel     Component Value Date/Time   CHOL 172 05/17/2021 1053   TRIG 87 05/17/2021 1053   HDL 48 05/17/2021 1053   CHOLHDL 3.6 05/17/2021 1053   VLDL 17 05/17/2021 1053   LDLCALC 107 (H) 05/17/2021 1053     Wt Readings from Last 3 Encounters:  02/21/22 173 lb 12.8 oz (78.8 kg)  02/12/22 174 lb 9.6 oz (79.2 kg)  02/05/22 183 lb 9.6 oz (83.3 kg)      Other studies Reviewed: Additional studies/ records that were reviewed today include: TEE 01/21/2020 Review of the above records today demonstrates:   1. Left ventricular ejection fraction, by estimation, is 55%. The  left  ventricle has normal function. The left ventricle has no regional wall  motion abnormalities. Left ventricular diastolic parameters are consistent  with Grade I diastolic dysfunction  (impaired relaxation).   2. Right ventricular systolic function is normal. The right ventricular  size is normal. There is normal pulmonary artery systolic pressure. The  estimated right ventricular systolic pressure is 33.8 mmHg.   3. Left atrial size was mildly dilated.   4. The mitral valve is normal in structure. No evidence of mitral valve  regurgitation. No evidence of mitral stenosis.   5. The aortic valve is tricuspid. Aortic valve regurgitation is not  visualized. No aortic stenosis is present.   6. The inferior vena cava is normal in size with greater than 50%  respiratory variability, suggesting right atrial pressure of 3 mmHg.    ASSESSMENT AND PLAN:  1.  Chronic [place] heart failure due to nonischemic cardiomyopathy: Currently on optimal medical  therapy with Toprol-XL 12.5 mg daily, eplerenone 25 mg daily, Jardiance 10 mg daily.  He is status post Medtronic ICD.  Ejection fraction has since normalized after atrial fibrillation ablation.  Device functioning appropriately.  No changes.  2.  Persistent atrial fibrillation: Currently on Eliquis.  Status post ablation 09/16/2019.  Currently on amiodarone 200 mg daily.  High risk medication monitoring for amiodarone.  He is unfortunately had more frequent episodes of atrial fibrillation.  He prefer to get off of the amiodarone.  Due to that, we Maizee Reinhold plan for ablation.  Risk, benefits, and alternatives to EP study and radiofrequency ablation for afib were also discussed in detail today. These risks include but are not limited to stroke, bleeding, vascular damage, tamponade, perforation, damage to the esophagus, lungs, and other structures, pulmonary vein stenosis, worsening renal function, and death. The patient understands these risk and wishes to proceed.  We Elpidio Thielen therefore proceed with catheter ablation at the next available time.  Carto, ICE, anesthesia are requested for the procedure.  Tarrin Menn also obtain CT PV protocol prior to the procedure to exclude LAA thrombus and further evaluate atrial anatomy.  3.  Secondary hypercoagulable state: Currently on Eliquis for atrial fibrillation as above   Current medicines are reviewed at length with the patient today.   The patient does not have concerns regarding his medicines.  The following changes were made today: None  Labs/ tests ordered today include:  Orders Placed This Encounter  Procedures   CT CARDIAC MORPH/PULM VEIN W/CM&W/O CA SCORE     Disposition:   FU with Jamilyn Pigeon 3 months  Signed, Tylan Briguglio Meredith Leeds, MD  02/21/2022 8:46 AM     [facility] [address] [place] [address] [phone] (office) [phone] (fax)

## 2022-02-27 ENCOUNTER — Telehealth (HOSPITAL_COMMUNITY): Payer: Self-pay

## 2022-02-27 NOTE — Telephone Encounter (Signed)
Left message for pt to let him know paperwork ready for collection.

## 2022-03-06 ENCOUNTER — Inpatient Hospital Stay (HOSPITAL_COMMUNITY): Admission: RE | Admit: 2022-03-06 | Payer: No Typology Code available for payment source | Source: Ambulatory Visit

## 2022-03-07 ENCOUNTER — Telehealth: Payer: Self-pay | Admitting: *Deleted

## 2022-03-07 ENCOUNTER — Ambulatory Visit: Payer: Medicare PPO | Admitting: Cardiology

## 2022-03-07 ENCOUNTER — Ambulatory Visit (HOSPITAL_COMMUNITY)
Admission: RE | Admit: 2022-03-07 | Discharge: 2022-03-07 | Disposition: A | Discharge status: Home / Self Care | Payer: No Typology Code available for payment source | Source: Ambulatory Visit | Attending: Adult Health | Admitting: Adult Health

## 2022-03-07 ENCOUNTER — Encounter: Payer: Self-pay | Admitting: Cardiology

## 2022-03-07 ENCOUNTER — Encounter (HOSPITAL_COMMUNITY): Payer: Self-pay

## 2022-03-07 VITALS — BP 108/62 | HR 89 | Ht 69.0 in | Wt 167.2 lb

## 2022-03-07 VITALS — BP 88/60 | HR 91 | Wt 168.4 lb

## 2022-03-07 DIAGNOSIS — G4733 Obstructive sleep apnea (adult) (pediatric): Secondary | ICD-10-CM | POA: Diagnosis not present

## 2022-03-07 DIAGNOSIS — Z8616 Personal history of COVID-19: Secondary | ICD-10-CM | POA: Insufficient documentation

## 2022-03-07 DIAGNOSIS — N183 Chronic kidney disease, stage 3 unspecified: Secondary | ICD-10-CM | POA: Insufficient documentation

## 2022-03-07 DIAGNOSIS — I5022 Chronic systolic (congestive) heart failure: Secondary | ICD-10-CM | POA: Diagnosis present

## 2022-03-07 DIAGNOSIS — Z7901 Long term (current) use of anticoagulants: Secondary | ICD-10-CM | POA: Diagnosis not present

## 2022-03-07 DIAGNOSIS — Z95810 Presence of automatic (implantable) cardiac defibrillator: Secondary | ICD-10-CM | POA: Insufficient documentation

## 2022-03-07 DIAGNOSIS — Z8572 Personal history of non-Hodgkin lymphomas: Secondary | ICD-10-CM | POA: Insufficient documentation

## 2022-03-07 DIAGNOSIS — Z79899 Other long term (current) drug therapy: Secondary | ICD-10-CM | POA: Diagnosis not present

## 2022-03-07 DIAGNOSIS — I480 Paroxysmal atrial fibrillation: Secondary | ICD-10-CM

## 2022-03-07 DIAGNOSIS — N1831 Chronic kidney disease, stage 3a: Secondary | ICD-10-CM

## 2022-03-07 DIAGNOSIS — I4891 Unspecified atrial fibrillation: Secondary | ICD-10-CM | POA: Diagnosis not present

## 2022-03-07 DIAGNOSIS — I428 Other cardiomyopathies: Secondary | ICD-10-CM | POA: Insufficient documentation

## 2022-03-07 LAB — BASIC METABOLIC PANEL
Anion gap: 12 (ref 5–15)
BUN: 15 mg/dL (ref 8–23)
CO2: 22 mmol/L (ref 22–32)
Calcium: 9.3 mg/dL (ref 8.9–10.3)
Chloride: 104 mmol/L (ref 98–111)
Creatinine, Ser: 1.71 mg/dL — ABNORMAL HIGH (ref 0.61–1.24)
GFR, Estimated: 42 mL/min — ABNORMAL LOW (ref >60)
Glucose, Bld: 230 mg/dL — ABNORMAL HIGH (ref 70–99)
Potassium: 4.3 mmol/L (ref 3.5–5.1)
Sodium: 138 mmol/L (ref 135–145)

## 2022-03-07 NOTE — Patient Instructions (Signed)
Medication Instructions:  The current medical regimen is effective;  continue present plan and medications.  *If you need a refill on your cardiac medications before your next appointment, please call your pharmacy*  Testing/Procedures: Your physician has recommended that you have a sleep study. This test records several body functions during sleep, including: brain activity, eye movement, oxygen and carbon dioxide blood levels, heart rate and rhythm, breathing rate and rhythm, the flow of air through your mouth and nose, snoring, body muscle movements, and chest and belly movement.  Follow-Up: At [facility], you and your health needs are our priority.  As part of our continuing mission to provide you with exceptional heart care, we have created designated Provider Care Teams.  These Care Teams include your primary Cardiologist (physician) and Advanced Practice Providers (APPs -  Physician Assistants and Nurse Practitioners) who all work together to provide you with the care you need, when you need it.  We recommend signing up for the patient portal called "MyChart".  Sign up information is provided on this After Visit Summary.  MyChart is used to connect with patients for Virtual Visits (Telemedicine).  Patients are able to view lab/test results, encounter notes, upcoming appointments, etc.  Non-urgent messages can be sent to your provider as well.   To learn more about what you can do with MyChart, go to [email or web address].    Your next appointment:   Follow up will be based on the results of your sleep study.   Important Information About Sugar

## 2022-03-07 NOTE — Telephone Encounter (Signed)
SET UP DATE 03/07/22

## 2022-03-07 NOTE — Patient Instructions (Addendum)
Labs done today. We will contact you only if your labs are abnormal.  DO NOT take lasix until tomorrow 03/08/2022.  No other medication changes were made. Please continue all current medications as prescribed.  Your physician recommends that you keep your scheduled follow-up appointment with Dr. [name].   If you have any questions or concerns before your next appointment please send [place] a message through [place] or call our office at [phone].    TO LEAVE A MESSAGE FOR THE NURSE SELECT OPTION 2, PLEASE LEAVE A MESSAGE INCLUDING: YOUR NAME DATE OF BIRTH CALL BACK NUMBER REASON FOR CALL**this is important as we prioritize the call backs  YOU WILL RECEIVE A CALL BACK THE SAME DAY AS LONG AS YOU CALL BEFORE 4:00 PM   Do the following things EVERYDAY: Weigh yourself in the morning before breakfast. Write it down and keep it in a log. Take your medicines as prescribed Eat low salt foods--Limit salt (sodium) to 2000 mg per day.  Stay as active as you can everyday Limit all fluids for the day to less than 2 liters   At the [facility], you and your health needs are our priority. As part of our continuing mission to provide you with exceptional heart care, we have created designated Provider Care Teams. These Care Teams include your primary Cardiologist (physician) and Advanced Practice Providers (APPs- Physician Assistants and Nurse Practitioners) who all work together to provide you with the care you need, when you need it.   You may see any of the following providers on your designated Care Team at your next follow up: Dr Glori Bickers Dr Haynes Kerns, NP Lyda Jester, [place] Audry Riles, PharmD   Please be sure to bring in all your medications bottles to every appointment.

## 2022-03-07 NOTE — Progress Notes (Signed)
Sleep medicine CONSULT Note    Date:  03/07/2022   ID:  Molli Knock, DOB [birth year], MRN [id number]  PCP:  [facility] [place]  Cardiologist:  Fransico Him, MD   Chief Complaint  Patient presents with   New Patient (Initial Visit)    Obstructive sleep apnea    History of Present Illness:  Jon Oconnell is a 71 y.o. male who is being seen today for the evaluation of obstructive sleep apnea at the request of [place], Maricela Bo, [place].  This is a 71 year old African-American male with a history of atrial fibrillation, chronic systolic CHF status post AICD for nonischemic dilated cardiomyopathy, hyperlipidemia and obstructive sleep apnea unable to tolerate CPAP.  He is now referred for further evaluation as of obstructive sleep apnea by advanced heart failure clinic.  He tells me that he was diagnosed with OSA at least 8 years ago.  He was initially on CPAP and has been using it off and on but cannot get used to it enough to use on a nightly basis.  He tells me that he uses nasal pillow mask because he could not tolerate the FFM due to claustrophobia and air hunger.  Now he has problems with the nasal pillow mask.  It moves around in his sleep and he wakes up and has to reposition it so he gets very poor sleep.  When he wakes up he feels very tired and has to nap daily.  He sometimes will wake himself up snoring but no witnessed apnea at home.    Past Medical History:  Diagnosis Date   AICD (automatic cardioverter/defibrillator) present    Atrial fibrillation with RVR ([place]) 04/08/2019   Cardiac defibrillator in place 04/08/2019   Chronic congestive heart failure ([place]) 04/08/2019   Depression with anxiety 04/08/2019   Dilated cardiomyopathy ([place]) 04/09/2019   Dyspnea    with exertion   Dysrhythmia    A-fib   Hyperlipidemia 04/08/2019   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   NICM (nonischemic cardiomyopathy) ([place])    Non-ischemic cardiomyopathy ([place]) 03/03/2017   Sleep apnea    Thrombus  of left atrial appendage without antecedent myocardial infarction     Past Surgical History:  Procedure Laterality Date   ATRIAL FIBRILLATION ABLATION N/A 09/16/2019   Procedure: ATRIAL FIBRILLATION ABLATION;  Surgeon: Constance Haw, MD;  Location: [place] CV LAB;  Service: Cardiovascular;  Laterality: N/A;   CARDIOVERSION N/A 05/19/2019   Procedure: CARDIOVERSION;  Surgeon: Larey Dresser, MD;  Location: [facility] ENDOSCOPY;  Service: Cardiovascular;  Laterality: N/A;   CARDIOVERSION N/A 01/18/2022   Procedure: CARDIOVERSION;  Surgeon: Larey Dresser, MD;  Location: [facility] ENDOSCOPY;  Service: Cardiovascular;  Laterality: N/A;   ICD IMPLANT Left    RIGHT/LEFT HEART CATH AND CORONARY ANGIOGRAPHY N/A 04/23/2019   Procedure: RIGHT/LEFT HEART CATH AND CORONARY ANGIOGRAPHY;  Surgeon: Larey Dresser, MD;  Location: [place] CV LAB;  Service: Cardiovascular;  Laterality: N/A;   TEE WITHOUT CARDIOVERSION N/A 05/19/2019   Procedure: TRANSESOPHAGEAL ECHOCARDIOGRAM (TEE);  Surgeon: Larey Dresser, MD;  Location: [facility] ENDOSCOPY;  Service: Cardiovascular;  Laterality: N/A;    Current Medications: Current Meds  Medication Sig   amiodarone (PACERONE) 200 MG tablet Take 200 mg by mouth daily.   apixaban (ELIQUIS) 5 MG TABS tablet Take 1 tablet (5 mg total) by mouth 2 (two) times daily.   empagliflozin (JARDIANCE) 10 MG TABS tablet Take 1 tablet (10 mg total) by mouth daily before breakfast.   eplerenone (INSPRA) 25 MG  tablet Take 1 tablet (25 mg total) by mouth daily.   furosemide (LASIX) 20 MG tablet Take 2 tablets (40 mg total) by mouth daily.   ipratropium (ATROVENT) 0.02 % nebulizer solution Take 0.5 mg by nebulization every 6 (six) hours as needed for wheezing or shortness of breath.   metoprolol succinate (TOPROL-XL) 25 MG 24 hr tablet Take 0.5 tablets (12.5 mg total) by mouth at bedtime.   omeprazole (PRILOSEC) 20 MG capsule Take 40 mg by mouth daily.   potassium chloride (KLOR-CON) 10 MEQ  tablet Take 2 tablets (20 mEq total) by mouth daily.   Rosuvastatin Calcium 20 MG CPSP Take 20 mg by mouth daily.   sertraline (ZOLOFT) 50 MG tablet Take 50 mg by mouth daily.    vitamin B-12 (CYANOCOBALAMIN) 250 MCG tablet Take 500 mcg by mouth daily.    Allergies:   Patient has no known allergies.   Social History   Socioeconomic History   Marital status: Married    Spouse name: Not on file   Number of children: Not on file   Years of education: Not on file   Highest education level: Not on file  Occupational History   Not on file  Tobacco Use   Smoking status: Former   Smokeless tobacco: Never  Substance and Sexual Activity   Alcohol use: Not Currently   Drug use: Never   Sexual activity: Not Currently  Other Topics Concern   Not on file  Social History Narrative   Not on file   Social Determinants of Health   Financial Resource Strain: Not on file  Food Insecurity: Not on file  Transportation Needs: Not on file  Physical Activity: Not on file  Stress: Not on file  Social Connections: Not on file     Family History:  The patient's family history includes Breast cancer in his mother; Stroke in his father.   ROS:   Please see the history of present illness.    ROS All other systems reviewed and are negative.      No data to display             PHYSICAL EXAM:   VS:  BP 108/62   Pulse 89   Ht '5\' 9"'$  (1.753 m)   Wt 167 lb 3.2 oz (75.8 kg)   SpO2 97%   BMI 24.69 kg/m    GEN: Well nourished, well developed, in no acute distress  HEENT: normal  Neck: no JVD, carotid bruits, or masses Cardiac: RRR; no murmurs, rubs, or gallops,no edema.  Intact distal pulses bilaterally.  Respiratory:  clear to auscultation bilaterally, normal work of breathing GI: soft, nontender, nondistended, + BS MS: no deformity or atrophy  Skin: warm and dry, no rash Neuro:  Alert and Oriented x 3, Strength and sensation are intact Psych: euthymic mood, full affect  Wt  Readings from Last 3 Encounters:  03/07/22 167 lb 3.2 oz (75.8 kg)  02/21/22 173 lb 12.8 oz (78.8 kg)  02/12/22 174 lb 9.6 oz (79.2 kg)      Studies/Labs Reviewed:   EKG:  EKG is not ordered today.    Recent Labs: 10/26/2021: Hemoglobin 15.6; Platelets 159 02/12/2022: B Natriuretic Peptide 1,513.8; BUN 12; Creatinine, Ser 1.64; Potassium 4.1; Sodium 136   Lipid Panel    Component Value Date/Time   CHOL 172 05/17/2021 1053   TRIG 87 05/17/2021 1053   HDL 48 05/17/2021 1053   CHOLHDL 3.6 05/17/2021 1053   VLDL 17 05/17/2021 1053  [place] 107 (H) 05/17/2021 1053   Additional studies/ records that were reviewed today include:  Office visit notes from advanced heart failure and EP clinic    ASSESSMENT:    1. OSA (obstructive sleep apnea)      PLAN:  In order of problems listed above:  OSA -He has a history of obstructive sleep apnea and has been on CPAP intermittently since 2015.  He uses it off and on but is really intolerant to the CPAP mask.  He did not tolerate a full facemask due to claustrophobia and is currently using a nasal pillow mask with chinstrap but wakes up multiple times at night having to read -He has symptoms of excessive daytime sleepiness and sometimes wakes himself up snoring -Recommend an [place] home sleep study and if he has AHI is >15/h then refer him to ENT for evaluation for inspire hypoglossal nerve stimulator device  Time Spent: 20 minutes total time of encounter, including 15 minutes spent in face-to-face patient care on the date of this encounter. This time includes coordination of care and counseling regarding above mentioned problem list. Remainder of non-face-to-face time involved reviewing chart documents/testing relevant to the patient encounter and documentation in the medical record. I have independently reviewed documentation from referring provider  Medication Adjustments/Labs and Tests Ordered: Current medicines are reviewed at length  with the patient today.  Concerns regarding medicines are outlined above.  Medication changes, Labs and Tests ordered today are listed in the Patient Instructions below.  Patient Instructions  Medication Instructions:  The current medical regimen is effective;  continue present plan and medications.  *If you need a refill on your cardiac medications before your next appointment, please call your pharmacy*  Testing/Procedures: Your physician has recommended that you have a sleep study. This test records several body functions during sleep, including: brain activity, eye movement, oxygen and carbon dioxide blood levels, heart rate and rhythm, breathing rate and rhythm, the flow of air through your mouth and nose, snoring, body muscle movements, and chest and belly movement.  Follow-Up: At [facility], you and your health needs are our priority.  As part of our continuing mission to provide you with exceptional heart care, we have created designated Provider Care Teams.  These Care Teams include your primary Cardiologist (physician) and Advanced Practice Providers (APPs -  Physician Assistants and Nurse Practitioners) who all work together to provide you with the care you need, when you need it.  We recommend signing up for the patient portal called "MyChart".  Sign up information is provided on this After Visit Summary.  MyChart is used to connect with patients for Virtual Visits (Telemedicine).  Patients are able to view lab/test results, encounter notes, upcoming appointments, etc.  Non-urgent messages can be sent to your provider as well.   To learn more about what you can do with MyChart, go to [email or web address].    Your next appointment:   Follow up will be based on the results of your sleep study.   Important Information About Sugar         Signed, Fransico Him, MD  03/07/2022 12:24 PM    [facility] [address], [place], [place]  [address] Phone:  [phone]; Fax: [phone]
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
## 2022-03-07 NOTE — Telephone Encounter (Signed)
Pt was seen in the office today by Dr. Radford Pax who ordered an Itamar sleep study. Pt did not have enough battery charge in his phone, so he will load application on to his phone when he gets home. I have given the pt the information he will need for the application. Pt signed waiver and is in agreement to signed waiver. Pt aware he will receive a call with a PIN # once he has been approved. Pt thanked me for the help today.

## 2022-03-08 ENCOUNTER — Encounter: Payer: Self-pay | Admitting: *Deleted

## 2022-03-11 NOTE — Telephone Encounter (Signed)
Prior Authorization for ITAMAR sent to [employer] via Phone. Reference # .  NO PA REQ 

## 2022-03-12 ENCOUNTER — Encounter (INDEPENDENT_AMBULATORY_CARE_PROVIDER_SITE_OTHER): Payer: No Typology Code available for payment source | Admitting: Cardiology

## 2022-03-12 DIAGNOSIS — G4733 Obstructive sleep apnea (adult) (pediatric): Secondary | ICD-10-CM
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
## 2022-03-13 NOTE — Telephone Encounter (Signed)
Patient called stating he did his sleep study last night. He wants to check if it was received.

## 2022-03-13 NOTE — Procedures (Signed)
   SLEEP STUDY REPORT Patient Information Study Date: 03/12/22 Patient Name: Jon Oconnell Patient ID: [id number] Birth Date: 2051/02/03 Age: 71 Gender: Male BMI: 24.8 (W=167 lb, H=5' 9'') Neck Circ.: 10 '' Referring Physician: Fransico Him, MD  TEST DESCRIPTION: Home sleep apnea testing was completed using the WatchPat, a Type 1 device, utilizing  peripheral arterial tonometry (PAT), chest movement, actigraphy, pulse oximetry, pulse rate, body position and snore.  AHI was calculated with apnea and hypopnea using valid sleep time as the denominator. RDI includes apneas,  hypopneas, and RERAs. The data acquired and the scoring of sleep and all associated events were performed in  accordance with the recommended standards and specifications as outlined in the AASM Manual for the Scoring of  Sleep and Associated Events 2.2.0 (2015).  FINDINGS: 1. Severe Obstructive Sleep Apnea with AHI 30.3/hr.  2. Mild Central Sleep Apnea with pAHIc 10/hr. 3. Oxygen desaturations as low as 85%. 4. Moderate to severe snoring was present. O2 sats were < 88% for 1.7 min. 5. Total sleep time was 8 hrs and 13 min. 6. 17.1% of total sleep time was spent in REM sleep.  7. Normla leep onset latency at 18 min.  8. Shortened REM sleep onset latency at 63 min.  9. Total awakenings were 12.   DIAGNOSIS:  Severe Obstructive Sleep Apnea (G47.33)  RECOMMENDATIONS: 1. Clinical correlation of these findings is necessary. The decision to treat obstructive sleep apnea (OSA) is usually  based on the presence of apnea symptoms or the presence of associated medical conditions such as Hypertension,  Congestive Heart Failure, Atrial Fibrillation or Obesity. The most common symptoms of OSA are snoring, gasping for  breath while sleeping, daytime sleepiness and fatigue.   2. Initiating apnea therapy is recommended given the presence of symptoms and/or associated conditions.  Recommend proceeding with one of the  following:   a. Auto-CPAP therapy with a pressure range of 5-20cm H2O.   b. An oral appliance (OA) that can be obtained from certain dentists with expertise in sleep medicine. These are  primarily of use in non-obese patients with mild and moderate disease.   c. An ENT consultation which may be useful to look for specific causes of obstruction and possible treatment  options.   d. If patient is intolerant to PAP therapy, consider referral to ENT for evaluation for hypoglossal nerve stimulator.   3. Close follow-up is necessary to ensure success with CPAP or oral appliance therapy for maximum benefit .  4. A follow-up oximetry study on CPAP is recommended to assess the adequacy of therapy and determine the need  for supplemental oxygen or the potential need for Bi-level therapy. An arterial blood gas to determine the adequacy of  baseline ventilation and oxygenation should also be considered.  5. Healthy sleep recommendations include: adequate nightly sleep (normal 7-9 hrs/night), avoidance of caffeine after  noon and alcohol near bedtime, and maintaining a sleep environment that is cool, dark and quiet.  6. Weight loss for overweight patients is recommended. Even modest amounts of weight loss can significantly  improve the severity of sleep apnea.  7. Snoring recommendations include: weight loss where appropriate, side sleeping, and avoidance of alcohol before  bed.  8. Operation of motor vehicle should be avoided when sleepy.  Signature: Electronically Signed: 03/13/22 Fransico Him, MD; [facility]; [place], American Board of  Sleep Medicine

## 2022-03-15 ENCOUNTER — Ambulatory Visit: Payer: No Typology Code available for payment source

## 2022-03-15 DIAGNOSIS — G4733 Obstructive sleep apnea (adult) (pediatric): Secondary | ICD-10-CM

## 2022-03-22 ENCOUNTER — Other Ambulatory Visit: Payer: Self-pay | Admitting: Internal Medicine

## 2022-04-01 ENCOUNTER — Telehealth: Payer: Self-pay | Admitting: *Deleted

## 2022-04-01 DIAGNOSIS — G4733 Obstructive sleep apnea (adult) (pediatric): Secondary | ICD-10-CM

## 2022-04-01 DIAGNOSIS — G4730 Sleep apnea, unspecified: Secondary | ICD-10-CM

## 2022-04-01 DIAGNOSIS — I480 Paroxysmal atrial fibrillation: Secondary | ICD-10-CM

## 2022-04-01 DIAGNOSIS — I5022 Chronic systolic (congestive) heart failure: Secondary | ICD-10-CM

## 2022-04-01 NOTE — Telephone Encounter (Signed)
The patient has been notified of the result.Left detailed message on voicemail and informed patient to call back. Marolyn Hammock, [place] 04/01/2022 6:43 PM

## 2022-04-01 NOTE — Telephone Encounter (Signed)
-----   Message from Lauralee Evener, [place] sent at 03/14/2022  9:31 AM EDT -----  ----- Message ----- From: Sueanne Margarita, MD Sent: 03/13/2022   2:55 PM EDT To: [facility] Sleep Studies  Please let patient know that they have sleep apnea.  Recommend therapeutic CPAP titration for treatment of patient's sleep disordered breathing.  If unable to perform an in lab titration then initiate ResMed auto CPAP from 4 to 15cm H2O with heated humidity and mask of choice and overnight pulse ox on CPAP.

## 2022-04-02 NOTE — Addendum Note (Signed)
Addended by: Freada Bergeron on: 04/02/2022 11:50 AM   Modules accepted: Orders

## 2022-04-02 NOTE — Telephone Encounter (Signed)
Patient returned your call.

## 2022-04-03 NOTE — Telephone Encounter (Signed)
Called patient; left message to call back.

## 2022-04-08 ENCOUNTER — Other Ambulatory Visit (HOSPITAL_COMMUNITY): Payer: Self-pay | Admitting: Cardiology

## 2022-04-08 NOTE — Telephone Encounter (Signed)
Called results left message to call back.

## 2022-04-09 ENCOUNTER — Telehealth: Payer: Self-pay | Admitting: Cardiovascular Disease

## 2022-04-09 NOTE — Telephone Encounter (Signed)
Pt returning CMA's call. Please advise

## 2022-04-09 NOTE — Telephone Encounter (Signed)
Pt called back per returning CMA's message received in Triage.    Pt advised to send a manual transmission to reestablish connection, as prior note stated monitor was not communicating with our system.  Pt also given [employer] telephone number for extra assistance.    Pt understood requests, and no follow up required at this time.

## 2022-04-11 NOTE — Telephone Encounter (Signed)
The patient has been notified of the result and verbalized understanding.  All questions (if any) were answered. Marolyn Hammock, [place] 04/11/2022 3:29 PM    Patient said he discussed getting the inspire device with you and he is very interested in getting the inspire.

## 2022-04-16 NOTE — Addendum Note (Signed)
Addended by: Antonieta Iba on: 04/16/2022 07:59 AM   Modules accepted: Orders

## 2022-04-16 NOTE — Telephone Encounter (Signed)
Referral has been placed. 

## 2022-05-03 ENCOUNTER — Ambulatory Visit (INDEPENDENT_AMBULATORY_CARE_PROVIDER_SITE_OTHER): Payer: No Typology Code available for payment source

## 2022-05-03 DIAGNOSIS — I428 Other cardiomyopathies: Secondary | ICD-10-CM

## 2022-05-03 LAB — CUP PACEART REMOTE DEVICE CHECK
Battery Remaining Longevity: 34 mo
Battery Voltage: 2.96 V
Brady Statistic RV Percent Paced: 0 %
Date Time Interrogation Session: 20230811001603
HighPow Impedance: 69 Ohm
Implantable Lead Implant Date: 20140818
Implantable Lead Location: 753860
Implantable Pulse Generator Implant Date: 20140818
Lead Channel Impedance Value: 323 Ohm
Lead Channel Impedance Value: 380 Ohm
Lead Channel Pacing Threshold Amplitude: 0.875 V
Lead Channel Pacing Threshold Pulse Width: 0.4 ms
Lead Channel Sensing Intrinsic Amplitude: 11.625 mV
Lead Channel Sensing Intrinsic Amplitude: 11.625 mV
Lead Channel Setting Pacing Amplitude: 2 V
Lead Channel Setting Pacing Pulse Width: 0.4 ms
Lead Channel Setting Sensing Sensitivity: 0.3 mV

## 2022-05-14 ENCOUNTER — Other Ambulatory Visit (HOSPITAL_COMMUNITY): Payer: Self-pay | Admitting: *Deleted

## 2022-05-14 MED ORDER — EMPAGLIFLOZIN 25 MG PO TABS: ORAL_TABLET | ORAL | 11 refills | Status: DC

## 2022-05-16 ENCOUNTER — Telehealth (HOSPITAL_COMMUNITY): Payer: Self-pay

## 2022-05-16 NOTE — Telephone Encounter (Signed)
Yes agree with that plan

## 2022-05-16 NOTE — Telephone Encounter (Signed)
Patient just realized that he had been taking both [place] and [place] for quite some time now. I advised patient that he was only suppose to be taking Jardaince and to stop the [place] immediately. Is there anything else he needs to do at this moment? Please advise.

## 2022-05-22 ENCOUNTER — Ambulatory Visit (HOSPITAL_COMMUNITY)
Admission: RE | Admit: 2022-05-22 | Discharge: 2022-05-22 | Disposition: A | Discharge status: Home / Self Care | Payer: No Typology Code available for payment source | Source: Ambulatory Visit | Attending: Cardiology | Admitting: Cardiology

## 2022-05-22 ENCOUNTER — Encounter (HOSPITAL_COMMUNITY): Payer: Self-pay | Admitting: Cardiology

## 2022-05-22 VITALS — BP 90/60 | HR 71 | Wt 168.2 lb

## 2022-05-22 DIAGNOSIS — I5022 Chronic systolic (congestive) heart failure: Secondary | ICD-10-CM | POA: Diagnosis present

## 2022-05-22 DIAGNOSIS — E782 Mixed hyperlipidemia: Secondary | ICD-10-CM | POA: Diagnosis not present

## 2022-05-22 LAB — COMPREHENSIVE METABOLIC PANEL
ALT: 38 U/L (ref 0–44)
AST: 31 U/L (ref 15–41)
Albumin: 4.1 g/dL (ref 3.5–5.0)
Alkaline Phosphatase: 64 U/L (ref 38–126)
Anion gap: 7 (ref 5–15)
BUN: 13 mg/dL (ref 8–23)
CO2: 27 mmol/L (ref 22–32)
Calcium: 9.8 mg/dL (ref 8.9–10.3)
Chloride: 105 mmol/L (ref 98–111)
Creatinine, Ser: 1.5 mg/dL — ABNORMAL HIGH (ref 0.61–1.24)
GFR, Estimated: 49 mL/min — ABNORMAL LOW (ref >60)
Glucose, Bld: 99 mg/dL (ref 70–99)
Potassium: 4.8 mmol/L (ref 3.5–5.1)
Sodium: 139 mmol/L (ref 135–145)
Total Bilirubin: 1.2 mg/dL (ref 0.3–1.2)
Total Protein: 7.2 g/dL (ref 6.5–8.1)

## 2022-05-22 LAB — LIPID PANEL
Cholesterol: 192 mg/dL (ref 0–200)
HDL: 70 mg/dL (ref >40)
LDL Cholesterol: 108 mg/dL — ABNORMAL HIGH (ref 0–99)
Total CHOL/HDL Ratio: 2.7 RATIO
Triglycerides: 72 mg/dL (ref <150)
VLDL: 14 mg/dL (ref 0–40)

## 2022-05-22 LAB — TSH: TSH: 2.059 u[IU]/mL (ref 0.350–4.500)

## 2022-05-22 MED ORDER — METOPROLOL SUCCINATE ER 25 MG PO TB24: 25.0000 mg | ORAL_TABLET | Freq: Every day | ORAL | 3 refills | Status: DC

## 2022-05-22 NOTE — Patient Instructions (Signed)
Increase Toprol XL to '25mg'$  (1 Tab) nightly.  Labs done today, your results will be available in MyChart, we will contact you for abnormal readings.  Your physician recommends that you schedule a follow-up appointment in: 2 months  If you have any questions or concerns before your next appointment please send [place] a message through [place] or call our office at [phone].    TO LEAVE A MESSAGE FOR THE NURSE SELECT OPTION 2, PLEASE LEAVE A MESSAGE INCLUDING: YOUR NAME DATE OF BIRTH CALL BACK NUMBER REASON FOR CALL**this is important as we prioritize the call backs  YOU WILL RECEIVE A CALL BACK THE SAME DAY AS LONG AS YOU CALL BEFORE 4:00 PM  At the [facility], you and your health needs are our priority. As part of our continuing mission to provide you with exceptional heart care, we have created designated Provider Care Teams. These Care Teams include your primary Cardiologist (physician) and Advanced Practice Providers (APPs- Physician Assistants and Nurse Practitioners) who all work together to provide you with the care you need, when you need it.   You may see any of the following providers on your designated Care Team at your next follow up: Dr Glori Bickers Dr [name] Dr. Roxana Hires, NP Lyda Jester, [place] [facility], [place] Forestine Na, NP Audry Riles, PharmD   Please be sure to bring in all your medications bottles to every appointment.
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
## 2022-05-26 NOTE — Progress Notes (Signed)
Remote ICD transmission.   

## 2022-05-29 ENCOUNTER — Telehealth (HOSPITAL_COMMUNITY): Payer: Self-pay | Admitting: *Deleted

## 2022-05-29 NOTE — Telephone Encounter (Signed)
Reaching out to patient to offer assistance regarding upcoming cardiac imaging study; pt verbalizes understanding of appt date/time, parking situation and where to check in, pre-test NPO status  and verified current allergies; name and call back number provided for further questions should they arise  Jon Moomaw RN Navigator Cardiac Imaging [facility] [phone] office [phone] cell  Patient aware to arrive at 1pm. 

## 2022-05-30 ENCOUNTER — Telehealth: Payer: Self-pay | Admitting: Cardiology

## 2022-05-30 ENCOUNTER — Ambulatory Visit (HOSPITAL_COMMUNITY)
Admission: RE | Admit: 2022-05-30 | Discharge: 2022-05-30 | Disposition: A | Discharge status: Home / Self Care | Payer: Medicare PPO | Source: Ambulatory Visit | Attending: Cardiology | Admitting: Cardiology

## 2022-05-30 DIAGNOSIS — I4819 Other persistent atrial fibrillation: Secondary | ICD-10-CM | POA: Insufficient documentation

## 2022-05-30 MED ORDER — IOHEXOL 350 MG/ML SOLN
100.0000 mL | Freq: Once | INTRAVENOUS | Status: AC | PRN
  Administered 2022-05-30: 100 mL via INTRAVENOUS

## 2022-05-30 NOTE — Telephone Encounter (Signed)
Sleep study sent to Dr. Redmond Baseman via [facility] fax.

## 2022-05-30 NOTE — Telephone Encounter (Signed)
Left message for Jon Oconnell advising that sleep study has been faxed to them. Advised to call back if it was not received or if there was anything else needed.

## 2022-05-30 NOTE — Telephone Encounter (Signed)
Jon Oconnell from [facility] called stating that Dr. Redmond Baseman wants to know about the sleep study done 6/23.

## 2022-05-31 ENCOUNTER — Encounter: Payer: Self-pay | Admitting: Cardiology

## 2022-06-05 NOTE — Pre-Procedure Instructions (Signed)
Attempted to call patient regarding procedure instructions.  Left voice mail on the following items: Arrival time 0830 Nothing to eat or drink after midnight No meds AM of procedure Responsible person to drive you home and stay with you for 24 hrs  Have you missed any doses of anti-coagulant Eliquis- take both doses today, don't take dose in the morning.  If you have missed any doses please let office know right away.

## 2022-06-06 ENCOUNTER — Encounter (HOSPITAL_COMMUNITY)
Admission: RE | Disposition: A | Discharge status: Home / Self Care | Payer: No Typology Code available for payment source | Source: Home / Self Care | Attending: Cardiology

## 2022-06-06 ENCOUNTER — Ambulatory Visit (HOSPITAL_COMMUNITY)
Admission: RE | Admit: 2022-06-06 | Discharge: 2022-06-06 | Disposition: A | Discharge status: Home / Self Care | Payer: Medicare PPO | Attending: Cardiology | Admitting: Cardiology

## 2022-06-06 ENCOUNTER — Encounter (HOSPITAL_COMMUNITY): Payer: Self-pay | Admitting: Cardiology

## 2022-06-06 ENCOUNTER — Ambulatory Visit (HOSPITAL_BASED_OUTPATIENT_CLINIC_OR_DEPARTMENT_OTHER): Payer: Medicare PPO | Admitting: Certified Registered Nurse Anesthetist

## 2022-06-06 ENCOUNTER — Other Ambulatory Visit: Payer: Self-pay

## 2022-06-06 ENCOUNTER — Ambulatory Visit (HOSPITAL_COMMUNITY): Payer: Medicare PPO | Admitting: Certified Registered Nurse Anesthetist

## 2022-06-06 DIAGNOSIS — Z8571 Personal history of Hodgkin lymphoma: Secondary | ICD-10-CM | POA: Diagnosis not present

## 2022-06-06 DIAGNOSIS — Z87891 Personal history of nicotine dependence: Secondary | ICD-10-CM | POA: Insufficient documentation

## 2022-06-06 DIAGNOSIS — I4819 Other persistent atrial fibrillation: Secondary | ICD-10-CM

## 2022-06-06 DIAGNOSIS — I4891 Unspecified atrial fibrillation: Secondary | ICD-10-CM | POA: Diagnosis not present

## 2022-06-06 DIAGNOSIS — I509 Heart failure, unspecified: Secondary | ICD-10-CM

## 2022-06-06 DIAGNOSIS — G4730 Sleep apnea, unspecified: Secondary | ICD-10-CM | POA: Insufficient documentation

## 2022-06-06 DIAGNOSIS — Z7984 Long term (current) use of oral hypoglycemic drugs: Secondary | ICD-10-CM | POA: Diagnosis not present

## 2022-06-06 DIAGNOSIS — Z95810 Presence of automatic (implantable) cardiac defibrillator: Secondary | ICD-10-CM | POA: Insufficient documentation

## 2022-06-06 HISTORY — PX: ATRIAL FIBRILLATION ABLATION: EP1191

## 2022-06-06 LAB — CBC
HCT: 46.8 % (ref 39.0–52.0)
Hemoglobin: 15.6 g/dL (ref 13.0–17.0)
MCH: 28.6 pg (ref 26.0–34.0)
MCHC: 33.3 g/dL (ref 30.0–36.0)
MCV: 85.9 fL (ref 80.0–100.0)
Platelets: 167 10*3/uL (ref 150–400)
RBC: 5.45 MIL/uL (ref 4.22–5.81)
RDW: 14.8 % (ref 11.5–15.5)
WBC: 8.2 10*3/uL (ref 4.0–10.5)
nRBC: 0 % (ref 0.0–0.2)

## 2022-06-06 SURGERY — ATRIAL FIBRILLATION ABLATION
Anesthesia: General

## 2022-06-06 MED ORDER — SODIUM CHLORIDE 0.9% FLUSH: 3.0000 mL | INTRAVENOUS | Status: DC | PRN

## 2022-06-06 MED ORDER — HEPARIN SODIUM (PORCINE) 1000 UNIT/ML IJ SOLN
INTRAMUSCULAR | Status: AC
  Filled 2022-06-06: qty 10

## 2022-06-06 MED ORDER — FENTANYL CITRATE (PF) 100 MCG/2ML IJ SOLN: 25.0000 ug | INTRAMUSCULAR | Status: DC | PRN

## 2022-06-06 MED ORDER — ACETAMINOPHEN 325 MG PO TABS: 650.0000 mg | ORAL_TABLET | ORAL | Status: DC | PRN

## 2022-06-06 MED ORDER — AMISULPRIDE (ANTIEMETIC) 5 MG/2ML IV SOLN: 10.0000 mg | Freq: Once | INTRAVENOUS | Status: DC | PRN

## 2022-06-06 MED ORDER — SODIUM CHLORIDE 0.9% FLUSH: 3.0000 mL | Freq: Two times a day (BID) | INTRAVENOUS | Status: DC

## 2022-06-06 MED ORDER — PROPOFOL 10 MG/ML IV BOLUS
INTRAVENOUS | Status: DC | PRN
  Administered 2022-06-06: 50 mg via INTRAVENOUS

## 2022-06-06 MED ORDER — ONDANSETRON HCL 4 MG/2ML IJ SOLN
INTRAMUSCULAR | Status: DC | PRN
  Administered 2022-06-06: 4 mg via INTRAVENOUS

## 2022-06-06 MED ORDER — SUGAMMADEX SODIUM 200 MG/2ML IV SOLN
INTRAVENOUS | Status: DC | PRN
  Administered 2022-06-06: 200 mg via INTRAVENOUS

## 2022-06-06 MED ORDER — HEPARIN SODIUM (PORCINE) 1000 UNIT/ML IJ SOLN
INTRAMUSCULAR | Status: DC | PRN
  Administered 2022-06-06: 13000 [IU] via INTRAVENOUS

## 2022-06-06 MED ORDER — LIDOCAINE 2% (20 MG/ML) 5 ML SYRINGE
INTRAMUSCULAR | Status: DC | PRN
  Administered 2022-06-06: 40 mg via INTRAVENOUS
  Administered 2022-06-06: 60 mg via INTRAVENOUS

## 2022-06-06 MED ORDER — SODIUM CHLORIDE 0.9 % IV SOLN: 250.0000 mL | INTRAVENOUS | Status: DC | PRN

## 2022-06-06 MED ORDER — HEPARIN (PORCINE) IN NACL 1000-0.9 UT/500ML-% IV SOLN
INTRAVENOUS | Status: AC
  Filled 2022-06-06: qty 2000

## 2022-06-06 MED ORDER — ROCURONIUM BROMIDE 10 MG/ML (PF) SYRINGE
PREFILLED_SYRINGE | INTRAVENOUS | Status: DC | PRN
  Administered 2022-06-06: 60 mg via INTRAVENOUS

## 2022-06-06 MED ORDER — PHENYLEPHRINE HCL-NACL 20-0.9 MG/250ML-% IV SOLN
INTRAVENOUS | Status: DC | PRN
  Administered 2022-06-06: 10 ug/min via INTRAVENOUS

## 2022-06-06 MED ORDER — DEXAMETHASONE SODIUM PHOSPHATE 10 MG/ML IJ SOLN
INTRAMUSCULAR | Status: DC | PRN
  Administered 2022-06-06: 10 mg via INTRAVENOUS

## 2022-06-06 MED ORDER — PROTAMINE SULFATE 10 MG/ML IV SOLN
INTRAVENOUS | Status: DC | PRN
  Administered 2022-06-06: 50 mg via INTRAVENOUS

## 2022-06-06 MED ORDER — DOBUTAMINE INFUSION FOR EP/ECHO/NUC (1000 MCG/ML)
INTRAVENOUS | Status: AC
  Filled 2022-06-06: qty 250

## 2022-06-06 MED ORDER — ONDANSETRON HCL 4 MG/2ML IJ SOLN: 4.0000 mg | Freq: Once | INTRAMUSCULAR | Status: DC | PRN

## 2022-06-06 MED ORDER — HEPARIN (PORCINE) IN NACL 1000-0.9 UT/500ML-% IV SOLN
INTRAVENOUS | Status: DC | PRN
  Administered 2022-06-06 (×4): 500 mL

## 2022-06-06 MED ORDER — FENTANYL CITRATE (PF) 100 MCG/2ML IJ SOLN
INTRAMUSCULAR | Status: DC | PRN
  Administered 2022-06-06: 100 ug via INTRAVENOUS

## 2022-06-06 MED ORDER — SODIUM CHLORIDE 0.9 % IV SOLN: INTRAVENOUS | Status: DC

## 2022-06-06 MED ORDER — ONDANSETRON HCL 4 MG/2ML IJ SOLN: 4.0000 mg | Freq: Four times a day (QID) | INTRAMUSCULAR | Status: DC | PRN

## 2022-06-06 MED ORDER — DOBUTAMINE INFUSION FOR EP/ECHO/NUC (1000 MCG/ML)
INTRAVENOUS | Status: DC | PRN
  Administered 2022-06-06: 10 ug/kg/min via INTRAVENOUS

## 2022-06-06 MED ORDER — HEPARIN SODIUM (PORCINE) 1000 UNIT/ML IJ SOLN
INTRAMUSCULAR | Status: DC | PRN
  Administered 2022-06-06: 1000 [IU] via INTRAVENOUS

## 2022-06-06 MED ORDER — PHENYLEPHRINE 80 MCG/ML (10ML) SYRINGE FOR IV PUSH (FOR BLOOD PRESSURE SUPPORT)
PREFILLED_SYRINGE | INTRAVENOUS | Status: DC | PRN
  Administered 2022-06-06 (×2): 80 ug via INTRAVENOUS
  Administered 2022-06-06: 160 ug via INTRAVENOUS

## 2022-06-06 SURGICAL SUPPLY — 20 items
BLANKET WARM UNDERBOD FULL ACC (MISCELLANEOUS) ×1 IMPLANT
CATH 8FR REPROCESSED SOUNDSTAR (CATHETERS) ×1 IMPLANT
CATH 8FR SOUNDSTAR REPROCESSED (CATHETERS) IMPLANT
CATH ABLAT QDOT MICRO BI TC DF (CATHETERS) IMPLANT
CATH OCTARAY 2.0 F 3-3-3-3-3 (CATHETERS) IMPLANT
CATH PIGTAIL STEERABLE D1 8.7 (WIRE) IMPLANT
CATH S-M CIRCA TEMP PROBE (CATHETERS) IMPLANT
CATH WEB BI DIR CSDF CRV REPRO (CATHETERS) IMPLANT
CLOSURE PERCLOSE PROSTYLE (VASCULAR PRODUCTS) IMPLANT
COVER SWIFTLINK CONNECTOR (BAG) ×1 IMPLANT
PACK EP LATEX FREE (CUSTOM PROCEDURE TRAY) ×1
PACK EP LF (CUSTOM PROCEDURE TRAY) ×1 IMPLANT
PAD DEFIB RADIO PHYSIO CONN (PAD) ×1 IMPLANT
PATCH CARTO3 (PAD) IMPLANT
SHEATH CARTO VIZIGO SM CVD (SHEATH) IMPLANT
SHEATH PINNACLE 7F 10CM (SHEATH) IMPLANT
SHEATH PINNACLE 8F 10CM (SHEATH) IMPLANT
SHEATH PINNACLE 9F 10CM (SHEATH) IMPLANT
SHEATH PROBE COVER 6X72 (BAG) IMPLANT
TUBING SMART ABLATE COOLFLOW (TUBING) IMPLANT

## 2022-06-06 NOTE — H&P (Signed)
Electrophysiology Office Note   Date:  06/06/2022   ID:  Jon Oconnell, DOB [birth year], MRN [id number]  PCP:  [facility] [place] [place]  Cardiologist:  Marica Otter Primary Electrophysiologist:  Shameek Nyquist Meredith Leeds, MD    Chief Complaint: CHF   History of Present Illness: Jon Oconnell is a 71 y.o. male who is being seen today for the evaluation of CHF at the request of Kirk Ruths. Presenting today for electrophysiology evaluation.  He has a history significant for nonischemic cardiomyopathy, prior Hodgkin lymphoma, atrial fibrillation.  In 2014 he had a left heart catheterization that showed no obstructive coronary artery disease.  He has a Medtronic ICD.  He went to [facility] was found to have rapid atrial fibrillation.  He initially had an attempted TEE cardioversion and was found to have a left atrial appendage thrombus.  He was presented to [facility] 04/20/2019 with decompensated heart failure.  He underwent TEE cardioversion 05/13/2019.  He is status post atrial fibrillation ablation 09/16/2019.  He presented to heart failure clinic 01/15/2022 complaining of a weeks worth of palpitations.  He was short of breath when walking.  He was found to be in atrial fibrillation and is status post cardioversion 01/18/2022.  Today, denies symptoms of palpitations, chest pain, shortness of breath, orthopnea, PND, lower extremity edema, claudication, dizziness, presyncope, syncope, bleeding, or neurologic sequela. The patient is tolerating medications without difficulties. Plan for AF ablation today.    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   Dyspnea    with exertion   Dysrhythmia    A-fib    Hyperlipidemia 04/08/2019   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   NICM (nonischemic cardiomyopathy) ([place])    Non-ischemic cardiomyopathy ([place]) 03/03/2017   Sleep apnea    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    Past Surgical History:  Procedure Laterality Date   ATRIAL FIBRILLATION ABLATION N/A 09/16/2019   Procedure: ATRIAL FIBRILLATION ABLATION;  Surgeon: Constance Haw, MD;  Location: [place] CV LAB;  Service: Cardiovascular;  Laterality: N/A;   CARDIOVERSION N/A 05/19/2019   Procedure: CARDIOVERSION;  Surgeon: Larey Dresser, MD;  Location: [facility] ENDOSCOPY;  Service: Cardiovascular;  Laterality: N/A;   CARDIOVERSION N/A 01/18/2022   Procedure: CARDIOVERSION;  Surgeon: Larey Dresser, MD;  Location: [facility] ENDOSCOPY;  Service: Cardiovascular;  Laterality: N/A;   ICD IMPLANT Left    RIGHT/LEFT HEART CATH AND CORONARY ANGIOGRAPHY N/A 04/23/2019   Procedure: RIGHT/LEFT HEART CATH AND CORONARY ANGIOGRAPHY;  Surgeon: Larey Dresser, MD;  Location: [place] CV LAB;  Service: Cardiovascular;  Laterality: N/A;   TEE WITHOUT CARDIOVERSION N/A 05/19/2019   Procedure: TRANSESOPHAGEAL ECHOCARDIOGRAM (TEE);  Surgeon: Larey Dresser, MD;  Location: [facility] ENDOSCOPY;  Service: Cardiovascular;  Laterality: N/A;     Current Facility-Administered Medications  Medication Dose Route Frequency Provider Last Rate Last Admin   0.9 %  sodium chloride infusion   Intravenous Continuous Constance Haw, MD 50 mL/hr at 06/06/22 0950 New Bag at 06/06/22 0950    Allergies:   Lisinopril and Spironolactone   Social History:  The patient  reports that he has quit smoking. He has never used smokeless tobacco. He reports that he does not currently use alcohol. He reports that he does  not use drugs.   Family History:  The patient's family history includes Breast cancer in his mother; Stroke in his father.   ROS:  Please see the history of present illness.   Otherwise, review  of systems is positive for none.   All other systems are reviewed and negative.   PHYSICAL EXAM: VS:  BP 120/86   Pulse 84   Temp (!) 97.5 F (36.4 C) (Temporal)   Resp 17   Ht '5\' 9"'$  (1.753 m)   Wt 75.3 kg   SpO2 98%   BMI 24.51 kg/m  , BMI Body mass index is 24.51 kg/m. GEN: Well nourished, well developed, in no acute distress  HEENT: normal  Neck: no JVD, carotid bruits, or masses Cardiac: RRR; no murmurs, rubs, or gallops,no edema  Respiratory:  clear to auscultation bilaterally, normal work of breathing GI: soft, nontender, nondistended, + BS MS: no deformity or atrophy  Skin: warm and dry Neuro:  Strength and sensation are intact Psych: euthymic mood, full affect  Recent Labs: 10/26/2021: Hemoglobin 15.6; Platelets 159 02/12/2022: B Natriuretic Peptide 1,513.8 05/22/2022: ALT 38; BUN 13; Creatinine, Ser 1.50; Potassium 4.8; Sodium 139; TSH 2.059    Lipid Panel     Component Value Date/Time   CHOL 192 05/22/2022 1055   TRIG 72 05/22/2022 1055   HDL 70 05/22/2022 1055   CHOLHDL 2.7 05/22/2022 1055   VLDL 14 05/22/2022 1055   LDLCALC 108 (H) 05/22/2022 1055     Wt Readings from Last 3 Encounters:  06/06/22 75.3 kg  05/22/22 76.3 kg  03/07/22 76.4 kg      Other studies Reviewed: Additional studies/ records that were reviewed today include: TEE 01/21/2020 Review of the above records today demonstrates:   1. Left ventricular ejection fraction, by estimation, is 55%. The left  ventricle has normal function. The left ventricle has no regional wall  motion abnormalities. Left ventricular diastolic parameters are consistent  with Grade I diastolic dysfunction  (impaired relaxation).   2. Right ventricular systolic function is normal. The right ventricular  size is normal. There is normal pulmonary artery systolic pressure. The  estimated right ventricular systolic pressure is 29.9 mmHg.   3. Left atrial size was mildly dilated.   4. The mitral valve is normal in  structure. No evidence of mitral valve  regurgitation. No evidence of mitral stenosis.   5. The aortic valve is tricuspid. Aortic valve regurgitation is not  visualized. No aortic stenosis is present.   6. The inferior vena cava is normal in size with greater than 50%  respiratory variability, suggesting right atrial pressure of 3 mmHg.    ASSESSMENT AND PLAN:  1.  Chronic [place] heart failure due to nonischemic cardiomyopathy: Currently on optimal medical therapy with Toprol-XL 12.5 mg daily, eplerenone 25 mg daily, Jardiance 10 mg daily.  He is status post Medtronic ICD.  Ejection fraction has since normalized after atrial fibrillation ablation.  Device functioning appropriately.  No changes.  2.  Persistent atrial fibrillation: Jon Oconnell has presented today for surgery, with the diagnosis of AF.  The various methods of treatment have been discussed with the patient and family. After consideration of risks, benefits and other options for treatment, the patient has consented to  Procedure(s): Catheter ablation as a surgical intervention .  Risks include but not limited to complete heart block, stroke, esophageal damage, nerve damage, bleeding, vascular damage, tamponade, perforation, MI, and death. The patient's history has been reviewed, patient examined, no change in  status, stable for surgery.  I have reviewed the patient's chart and labs.  Questions were answered to the patient's satisfaction.    Keerthana Vanrossum Curt Bears, MD 06/06/2022 10:00 AM

## 2022-06-06 NOTE — Anesthesia Preprocedure Evaluation (Addendum)
Anesthesia Evaluation  Patient identified by MRN, date of birth, ID band Patient awake    Reviewed: Allergy & Precautions, NPO status , Patient's Chart, lab work & pertinent test results  Airway Mallampati: II  TM Distance: >3 FB Neck ROM: Full    Dental  (+) Upper Dentures, Lower Dentures   Pulmonary sleep apnea , former smoker,    Pulmonary exam normal        Cardiovascular +CHF  Normal cardiovascular exam+ dysrhythmias Atrial Fibrillation + Cardiac Defibrillator   ECHO: 1. Left ventricular ejection fraction, by estimation, is 25 to 30%. The  left ventricle has severely decreased function. The left ventricle  demonstrates global hypokinesis. The left ventricular internal cavity size  was mildly dilated. Left ventricular  diastolic parameters are consistent with Grade II diastolic dysfunction  (pseudonormalization).  2. Right ventricular systolic function is mildly reduced. The right  ventricular size is normal. There is moderately elevated pulmonary artery  systolic pressure. The estimated right ventricular systolic pressure is  58.5 mmHg.  3. Left atrial size was moderately dilated.  4. Right atrial size was mildly dilated.  5. The mitral valve is normal in structure. Mild mitral valve  regurgitation. No evidence of mitral stenosis.  6. The aortic valve is tricuspid. Aortic valve regurgitation is not  visualized.  7. The inferior vena cava is dilated in size with >50% respiratory  variability, suggesting right atrial pressure of 8 mmHg.    Neuro/Psych PSYCHIATRIC DISORDERS Anxiety Depression negative neurological ROS     GI/Hepatic negative GI ROS, Neg liver ROS,   Endo/Other  negative endocrine ROS  Renal/GU negative Renal ROS     Musculoskeletal negative musculoskeletal ROS (+)   Abdominal   Peds  Hematology  (+) Blood dyscrasia, ,   Anesthesia Other Findings A-fib  Reproductive/Obstetrics                            Anesthesia Physical Anesthesia Plan  ASA: 3  Anesthesia Plan: General   Post-op Pain Management:    Induction: Intravenous  PONV Risk Score and Plan: 2 and Dexamethasone, Ondansetron and Treatment may vary due to age or medical condition  Airway Management Planned: Oral ETT  Additional Equipment:   Intra-op Plan:   Post-operative Plan: Extubation in OR  Informed Consent: I have reviewed the patients History and Physical, chart, labs and discussed the procedure including the risks, benefits and alternatives for the proposed anesthesia with the patient or authorized representative who has indicated his/her understanding and acceptance.       Plan Discussed with: CRNA  Anesthesia Plan Comments:        Anesthesia Quick Evaluation

## 2022-06-06 NOTE — Transfer of Care (Signed)
Immediate Anesthesia Transfer of Care Note  Patient: Jon Oconnell  Procedure(s) Performed: ATRIAL FIBRILLATION ABLATION  Patient Location: PACU and Cath Lab  Anesthesia Type:General  Level of Consciousness: awake and alert   Airway & Oxygen Therapy: Patient Spontanous Breathing  Post-op Assessment: Report given to RN and Post -op Vital signs reviewed and stable  Post vital signs: Reviewed and stable  Last Vitals:  Vitals Value Taken Time  BP 103/66 06/06/22 1145  Temp 36.3 C 06/06/22 1146  Pulse 84 06/06/22 1148  Resp 15 06/06/22 1148  SpO2 99 % 06/06/22 1148  Vitals shown include unvalidated device data.  Last Pain:  Vitals:   06/06/22 1146  TempSrc: Temporal  PainSc: 0-No pain         Complications: There were no known notable events for this encounter.

## 2022-06-06 NOTE — Discharge Instructions (Signed)
Post procedure care instructions No driving for 4 days. No lifting over 5 lbs for 1 week. No vigorous or sexual activity for 1 week. You may return to work/your usual activities on 06/14/22. Keep procedure site clean & dry. If you notice increased pain, swelling, bleeding or pus, call/return!  You may shower after 24 hours, but no soaking in baths/hot tubs/pools for 1 week.    You have an appointment set up with the [facility].  Multiple studies have shown that being followed by a dedicated atrial fibrillation clinic in addition to the standard care you receive from your other physicians improves health. We believe that enrollment in the atrial fibrillation clinic will allow [place] to better care for you.   The phone number to the [facility] is [phone]. The clinic is staffed Monday through Friday from 8:30am to 5pm.  Parking Directions: The clinic is located in the Heart and Vascular Building connected to [facility] 1)From [address] turn on to [address] and go to the 3rd entrance  (Heart and Vascular entrance) on the right. 2)Look to the right for Heart &Vascular Parking Garage. 3)A code for the entrance is required, for October is 1504.   4)Take the elevators to the 1st floor. Registration is in the room with the glass walls at the end of the hallway.  If you have any trouble parking or locating the clinic, please don't hesitate to call [phone].

## 2022-06-06 NOTE — Progress Notes (Signed)
Dr. Curt Bears by to see patient. Denies discomfort now.

## 2022-06-06 NOTE — Anesthesia Procedure Notes (Signed)
Procedure Name: Intubation Date/Time: 06/06/2022 10:22 AM  Performed by: Dorthea Cove, CRNAPre-anesthesia Checklist: Patient identified, Emergency Drugs available, Suction available and Patient being monitored Patient Re-evaluated:Patient Re-evaluated prior to induction Oxygen Delivery Method: Circle system utilized Preoxygenation: Pre-oxygenation with 100% oxygen Induction Type: IV induction Ventilation: Mask ventilation without difficulty Laryngoscope Size: Mac and 4 Grade View: Grade I Tube type: Oral Number of attempts: 1 Airway Equipment and Method: Stylet and Oral airway Placement Confirmation: ETT inserted through vocal cords under direct vision, positive ETCO2 and breath sounds checked- equal and bilateral Secured at: 22 cm Tube secured with: Tape Dental Injury: Teeth and Oropharynx as per pre-operative assessment

## 2022-06-06 NOTE — Anesthesia Postprocedure Evaluation (Signed)
Anesthesia Post Note  Patient: Jon Oconnell  Procedure(s) Performed: ATRIAL FIBRILLATION ABLATION     Patient location during evaluation: Cath Lab Anesthesia Type: General Level of consciousness: awake Pain management: pain level controlled Vital Signs Assessment: post-procedure vital signs reviewed and stable Respiratory status: spontaneous breathing, nonlabored ventilation, respiratory function stable and patient connected to nasal cannula oxygen Cardiovascular status: blood pressure returned to baseline and stable Postop Assessment: no apparent nausea or vomiting Anesthetic complications: no   There were no known notable events for this encounter.  Last Vitals:  Vitals:   06/06/22 1330 06/06/22 1400  BP: 91/67 92/68  Pulse: 80 78  Resp: 12 (!) 9  Temp:    SpO2: 96% 96%    Last Pain:  Vitals:   06/06/22 1232  TempSrc:   PainSc: 0-No pain                 Adamary Savary P Anai Lipson

## 2022-06-07 ENCOUNTER — Encounter (HOSPITAL_COMMUNITY): Payer: Self-pay | Admitting: Cardiology

## 2022-06-07 LAB — POCT ACTIVATED CLOTTING TIME: Activated Clotting Time: 407 seconds

## 2022-06-11 ENCOUNTER — Telehealth: Payer: Self-pay | Admitting: Cardiology

## 2022-06-11 NOTE — Telephone Encounter (Signed)
Dr. Redmond Baseman from [facility] requesting to speak with Dr. Radford Pax

## 2022-06-12 NOTE — Telephone Encounter (Signed)
   [facility] called back. Dr. Redmond Baseman will be in the office around 3 pm. They are requesting if Dr. Radford Pax can call him back regarding pt's sleep study

## 2022-07-04 ENCOUNTER — Encounter (HOSPITAL_COMMUNITY): Payer: Self-pay | Admitting: Physician Assistant

## 2022-07-04 ENCOUNTER — Ambulatory Visit (HOSPITAL_COMMUNITY)
Admission: RE | Admit: 2022-07-04 | Discharge: 2022-07-04 | Disposition: A | Discharge status: Home / Self Care | Payer: No Typology Code available for payment source | Source: Ambulatory Visit | Attending: Physician Assistant | Admitting: Physician Assistant

## 2022-07-04 VITALS — BP 118/72 | HR 77 | Ht 69.0 in | Wt 169.4 lb

## 2022-07-04 DIAGNOSIS — Z7901 Long term (current) use of anticoagulants: Secondary | ICD-10-CM | POA: Diagnosis not present

## 2022-07-04 DIAGNOSIS — Z95810 Presence of automatic (implantable) cardiac defibrillator: Secondary | ICD-10-CM | POA: Insufficient documentation

## 2022-07-04 DIAGNOSIS — Z79899 Other long term (current) drug therapy: Secondary | ICD-10-CM | POA: Insufficient documentation

## 2022-07-04 DIAGNOSIS — I5022 Chronic systolic (congestive) heart failure: Secondary | ICD-10-CM | POA: Insufficient documentation

## 2022-07-04 DIAGNOSIS — I110 Hypertensive heart disease with heart failure: Secondary | ICD-10-CM | POA: Insufficient documentation

## 2022-07-04 DIAGNOSIS — D6869 Other thrombophilia: Secondary | ICD-10-CM | POA: Insufficient documentation

## 2022-07-04 DIAGNOSIS — I428 Other cardiomyopathies: Secondary | ICD-10-CM | POA: Insufficient documentation

## 2022-07-04 DIAGNOSIS — Z8572 Personal history of non-Hodgkin lymphomas: Secondary | ICD-10-CM | POA: Diagnosis not present

## 2022-07-04 DIAGNOSIS — I440 Atrioventricular block, first degree: Secondary | ICD-10-CM | POA: Insufficient documentation

## 2022-07-04 DIAGNOSIS — I4819 Other persistent atrial fibrillation: Secondary | ICD-10-CM | POA: Insufficient documentation

## 2022-07-04 DIAGNOSIS — G4733 Obstructive sleep apnea (adult) (pediatric): Secondary | ICD-10-CM | POA: Insufficient documentation

## 2022-07-04 NOTE — Progress Notes (Signed)
Primary Care Physician: [facility] Primary Cardiologist: Dr [name] Primary Electrophysiologist: Dr Curt Bears Referring Physician: Dr Henrine Screws is a 71 y.o. male with a history of nonischemic cardiomyopathy s/p ICD, prior non-Hodgkins lymphoma, OSA, and persistent atrial fibrillation who presents for follow up in the [facility]. In 2014 he had a left heart catheterization that showed no obstructive coronary artery disease. He went to the hospital in [place] and was found to have atrial fibrillation with rapid rates.  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  He was admitted to [facility] 04/20/2019 with decompensated heart failure and was diuresed.  He underwent TEE guided cardioversion to sinus rhythm on 05/13/2019.  TEE at the time showed an EF of 30 to 35% with no left atrial thrombus. Patient is s/p afib ablation with Dr Curt Bears on 09/16/19.   He presented to heart failure clinic 01/15/2022 complaining of a weeks worth of palpitations.  He was short of breath when walking.  He was found to be in atrial fibrillation and is status post cardioversion 01/18/2022.  He underwent repeat afib ablation with Dr Curt Bears on 06/06/22.   On follow up today, patient reports that he has done well since his ablation with no known episodes of afib. He does feel that he has more energy. He denies chest pain, swallowing pain, or groin issues.   Today, he denies symptoms of palpitations, chest pain, shortness of breath, orthopnea, PND, lower extremity edema, dizziness, presyncope, syncope, snoring, daytime somnolence, bleeding, or neurologic sequela. The patient is tolerating medications without difficulties and is otherwise without complaint today.    Atrial Fibrillation Risk Factors:  he does have symptoms or diagnosis of sleep apnea. he is not compliant with  CPAP therapy.   he has a BMI of Body mass index is 25.02 kg/m.Marland Kitchen Filed Weights   07/04/22 1037  Weight: 76.8 kg    Family History  Problem Relation Age of Onset   Breast cancer Mother    Stroke Father      Atrial Fibrillation Management history:  Previous antiarrhythmic drugs: amiodarone Previous cardioversions: 05/19/19, 01/18/22 Previous ablations: 09/16/19, 06/06/22 CHADS2VASC score: 2 Anticoagulation history: Eliquis   Past Medical History:  Diagnosis Date   AICD (automatic cardioverter/defibrillator) present    Atrial fibrillation with RVR ([place]) 04/08/2019   Cardiac defibrillator in place 04/08/2019   Chronic congestive heart failure ([place]) 04/08/2019   Depression with anxiety 04/08/2019   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   Dyspnea    with exertion   Dysrhythmia    A-fib   Hyperlipidemia 04/08/2019   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   NICM (nonischemic cardiomyopathy) ([place])    Non-ischemic cardiomyopathy ([place]) 03/03/2017   Sleep apnea    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    Past Surgical History:  Procedure Laterality Date   ATRIAL FIBRILLATION ABLATION N/A 09/16/2019   Procedure: ATRIAL FIBRILLATION ABLATION;  Surgeon: Constance Haw, MD;  Location: [place] CV LAB;  Service: Cardiovascular;  Laterality: N/A;   ATRIAL FIBRILLATION ABLATION N/A 06/06/2022   Procedure: ATRIAL FIBRILLATION ABLATION;  Surgeon: Constance Haw, MD;  Location: [place] CV LAB;  Service: Cardiovascular;  Laterality: N/A;   CARDIOVERSION N/A 05/19/2019   Procedure: CARDIOVERSION;  Surgeon: Larey Dresser, MD;  Location: [facility] ENDOSCOPY;  Service: Cardiovascular;  Laterality: N/A;   CARDIOVERSION N/A 01/18/2022  Procedure: CARDIOVERSION;  Surgeon: Larey Dresser, MD;  Location: [facility] ENDOSCOPY;  Service: Cardiovascular;  Laterality: N/A;   ICD IMPLANT Left    RIGHT/LEFT HEART CATH AND CORONARY ANGIOGRAPHY N/A 04/23/2019   Procedure:  RIGHT/LEFT HEART CATH AND CORONARY ANGIOGRAPHY;  Surgeon: Larey Dresser, MD;  Location: [place] CV LAB;  Service: Cardiovascular;  Laterality: N/A;   TEE WITHOUT CARDIOVERSION N/A 05/19/2019   Procedure: TRANSESOPHAGEAL ECHOCARDIOGRAM (TEE);  Surgeon: Larey Dresser, MD;  Location: [facility] ENDOSCOPY;  Service: Cardiovascular;  Laterality: N/A;    Current Outpatient Medications  Medication Sig Dispense Refill   amiodarone (PACERONE) 200 MG tablet Take 200 mg by mouth daily.     apixaban (ELIQUIS) 5 MG TABS tablet Take 1 tablet (5 mg total) by mouth 2 (two) times daily. 60 tablet 5   empagliflozin (JARDIANCE) 25 MG TABS tablet TAKE ONE-HALF TABLET BY MOUTH EVERY MORNING TO LOWER BLOOD SUGAR.TAKE BEFORE BREAKFAST. 15 tablet 11   eplerenone (INSPRA) 25 MG tablet Take 1 tablet (25 mg total) by mouth daily. 90 tablet 3   furosemide (LASIX) 20 MG tablet Take 2 tablets (40 mg total) by mouth daily. 180 tablet 3   ipratropium (ATROVENT) 0.02 % nebulizer solution Take 0.5 mg by nebulization every 6 (six) hours as needed for wheezing or shortness of breath.     metoprolol succinate (TOPROL-XL) 25 MG 24 hr tablet Take 1 tablet (25 mg total) by mouth at bedtime. 90 tablet 3   omeprazole (PRILOSEC) 40 MG capsule Take 40 mg by mouth daily.     potassium chloride (KLOR-CON) 10 MEQ tablet Take 2 tablets (20 mEq total) by mouth daily. 180 tablet 3   Rosuvastatin Calcium 20 MG CPSP Take 20 mg by mouth daily.     vitamin B-12 (CYANOCOBALAMIN) 500 MCG tablet Take 500 mcg by mouth daily.     No current facility-administered medications for this encounter.    Allergies  Allergen Reactions   Lisinopril Other (See Comments)    Pass out   Spironolactone Other (See Comments)     Gynecomastia    Social History   Socioeconomic History   Marital status: Married    Spouse name: Not on file   Number of children: Not on file   Years of education: Not on file   Highest education level: Not on file   Occupational History   Not on file  Tobacco Use   Smoking status: Former   Smokeless tobacco: Never   Tobacco comments:    Former smoker 07/04/22  Substance and Sexual Activity   Alcohol use: Not Currently   Drug use: Never   Sexual activity: Not Currently  Other Topics Concern   Not on file  Social History Narrative   Not on file   Social Determinants of Health   Financial Resource Strain: Not on file  Food Insecurity: Not on file  Transportation Needs: Not on file  Physical Activity: Not on file  Stress: Not on file  Social Connections: Not on file  Intimate Partner Violence: Not on file     ROS- All systems are reviewed and negative except as per the HPI above.  Physical Exam: Vitals:   07/04/22 1037  BP: 118/72  Pulse: 77  Weight: 76.8 kg  Height: '5\' 9"'$  (1.753 m)     GEN- The patient is a well appearing male, alert and oriented x 3 today.   HEENT-head normocephalic, atraumatic, sclera clear, conjunctiva pink, hearing intact, trachea midline. Lungs- Clear to ausculation  bilaterally, normal work of breathing Heart- Regular rate and rhythm, no murmurs, rubs or gallops  GI- soft, NT, ND, + BS Extremities- no clubbing, cyanosis, or edema MS- no significant deformity or atrophy Skin- no rash or lesion Psych- euthymic mood, full affect Neuro- strength and sensation are intact   Wt Readings from Last 3 Encounters:  07/04/22 76.8 kg  06/06/22 75.3 kg  05/22/22 76.3 kg    EKG today demonstrates  SR, 1st degree AV block, LAFB Vent. rate 77 BPM PR interval 258 ms QRS duration 128 ms QT/QTcB 482/545 ms  Echo 10/26/21 demonstrated   1. Left ventricular ejection fraction, by estimation, is 25 to 30%. The  left ventricle has severely decreased function. The left ventricle  demonstrates global hypokinesis. The left ventricular internal cavity size was mildly dilated. Left ventricular diastolic parameters are consistent with Grade II diastolic dysfunction  (pseudonormalization).   2. Right ventricular systolic function is mildly reduced. The right  ventricular size is normal. There is moderately elevated pulmonary artery systolic pressure. The estimated right ventricular systolic pressure is 19.5 mmHg.   3. Left atrial size was moderately dilated.   4. Right atrial size was mildly dilated.   5. The mitral valve is normal in structure. Mild mitral valve  regurgitation. No evidence of mitral stenosis.   6. The aortic valve is tricuspid. Aortic valve regurgitation is not  visualized.   7. The inferior vena cava is dilated in size with >50% respiratory  variability, suggesting right atrial pressure of 8 mmHg.     Epic records are reviewed at length today  CHA2DS2-VASc Score = 2  The patient's score is based upon: CHF History: 1 HTN History: 0 Diabetes History: 0 Stroke History: 0 Vascular Disease History: 0 Age Score: 1 Gender Score: 0        ASSESSMENT AND PLAN: 1. Persistent Atrial Fibrillation (ICD10:  I48.19) The patient's CHA2DS2-VASc score is 2, indicating a 2.2% annual risk of stroke.   S/p afib ablation 09/16/19 with repeat ablation 06/06/22 Patient appears to be maintaining SR. Continue Eliquis 5 mg BID with no missed doses for 3 months post ablation.  Continue amiodarone 200 mg daily  Continue Toprol 25 mg BID  2. Secondary Hypercoagulable State (ICD10:  D68.69) The patient is at significant risk for stroke/thromboembolism based upon his CHA2DS2-VASc Score of 2.  Continue Apixaban (Eliquis).   3. Obstructive sleep apnea Encouraged compliance with CPAP therapy.   4. Chronic systolic CHF NICM, s/p ICD Followed by Dr [name] in [facility]. Fluid status appears stable today.    Follow up with Dr Curt Bears as scheduled.    [facility] [address] [place], [place] [address] [phone] 07/04/2022 10:49 AM

## 2022-07-05 ENCOUNTER — Telehealth: Payer: Self-pay

## 2022-07-05 DIAGNOSIS — I4819 Other persistent atrial fibrillation: Secondary | ICD-10-CM

## 2022-07-05 DIAGNOSIS — G4733 Obstructive sleep apnea (adult) (pediatric): Secondary | ICD-10-CM

## 2022-07-05 NOTE — Telephone Encounter (Signed)
Per Dr. Radford Pax: Percival Spanish can you please order an in lab PSG on this patient to determine what percentage of his apneas are central events in hopes he may still qualify for [facility]

## 2022-07-15 ENCOUNTER — Telehealth: Payer: Self-pay | Admitting: *Deleted

## 2022-07-15 NOTE — Telephone Encounter (Addendum)
Prior Authorization for titration sent to [facility] via Fax. 7/20  *Percival '@[phone]'$ [phone].  PER Pt precert with only [employer]. Per patient. Don't precert with [facility] insurance.

## 2022-07-15 NOTE — Telephone Encounter (Signed)
Dr Radford Pax will reach out to Dr Redmond Baseman.

## 2022-07-22 ENCOUNTER — Encounter (HOSPITAL_COMMUNITY): Payer: Self-pay

## 2022-07-22 ENCOUNTER — Ambulatory Visit (HOSPITAL_COMMUNITY)
Admission: RE | Admit: 2022-07-22 | Discharge: 2022-07-22 | Disposition: A | Discharge status: Home / Self Care | Payer: No Typology Code available for payment source | Source: Ambulatory Visit | Attending: Family Medicine | Admitting: Family Medicine

## 2022-07-22 VITALS — BP 110/70 | HR 66 | Wt 172.4 lb

## 2022-07-22 DIAGNOSIS — R55 Syncope and collapse: Secondary | ICD-10-CM | POA: Insufficient documentation

## 2022-07-22 DIAGNOSIS — Z8572 Personal history of non-Hodgkin lymphomas: Secondary | ICD-10-CM | POA: Insufficient documentation

## 2022-07-22 DIAGNOSIS — I480 Paroxysmal atrial fibrillation: Secondary | ICD-10-CM

## 2022-07-22 DIAGNOSIS — G4733 Obstructive sleep apnea (adult) (pediatric): Secondary | ICD-10-CM

## 2022-07-22 DIAGNOSIS — Z7901 Long term (current) use of anticoagulants: Secondary | ICD-10-CM | POA: Diagnosis not present

## 2022-07-22 DIAGNOSIS — I5022 Chronic systolic (congestive) heart failure: Secondary | ICD-10-CM | POA: Insufficient documentation

## 2022-07-22 DIAGNOSIS — Z87898 Personal history of other specified conditions: Secondary | ICD-10-CM

## 2022-07-22 DIAGNOSIS — I428 Other cardiomyopathies: Secondary | ICD-10-CM | POA: Insufficient documentation

## 2022-07-22 DIAGNOSIS — N183 Chronic kidney disease, stage 3 unspecified: Secondary | ICD-10-CM | POA: Diagnosis not present

## 2022-07-22 DIAGNOSIS — Q245 Malformation of coronary vessels: Secondary | ICD-10-CM

## 2022-07-22 DIAGNOSIS — I4891 Unspecified atrial fibrillation: Secondary | ICD-10-CM | POA: Insufficient documentation

## 2022-07-22 DIAGNOSIS — N1831 Chronic kidney disease, stage 3a: Secondary | ICD-10-CM

## 2022-07-22 LAB — BASIC METABOLIC PANEL
Anion gap: 10 (ref 5–15)
BUN: 22 mg/dL (ref 8–23)
CO2: 22 mmol/L (ref 22–32)
Calcium: 9.2 mg/dL (ref 8.9–10.3)
Chloride: 103 mmol/L (ref 98–111)
Creatinine, Ser: 1.56 mg/dL — ABNORMAL HIGH (ref 0.61–1.24)
GFR, Estimated: 47 mL/min — ABNORMAL LOW (ref >60)
Glucose, Bld: 207 mg/dL — ABNORMAL HIGH (ref 70–99)
Potassium: 3.9 mmol/L (ref 3.5–5.1)
Sodium: 135 mmol/L (ref 135–145)

## 2022-07-22 MED ORDER — FUROSEMIDE 20 MG PO TABS: 40.0000 mg | ORAL_TABLET | ORAL | 2 refills | Status: DC

## 2022-07-22 NOTE — Patient Instructions (Signed)
Thank you for coming in today  Labs were done today, if any labs are abnormal the clinic will call you No news is good news  Your physician recommends that you schedule a follow-up appointment in:  3-4 months with Dr. [name]   Your physician recommends that you return for lab work in:  2 weeks BMET  CHANGE Lasix to 40 mg 2 tablets every other day    Do the following things EVERYDAY: Weigh yourself in the morning before breakfast. Write it down and keep it in a log. Take your medicines as prescribed Eat low salt foods--Limit salt (sodium) to 2000 mg per day.  Stay as active as you can everyday Limit all fluids for the day to less than 2 liters  At the [facility], you and your health needs are our priority. As part of our continuing mission to provide you with exceptional heart care, we have created designated Provider Care Teams. These Care Teams include your primary Cardiologist (physician) and Advanced Practice Providers (APPs- Physician Assistants and Nurse Practitioners) who all work together to provide you with the care you need, when you need it.   You may see any of the following providers on your designated Care Team at your next follow up: Dr Glori Bickers Dr [name] Dr. Roxana Hires, NP Lyda Jester, [place] [facility] [place], [place] Forestine Na, NP Audry Riles, PharmD   Please be sure to bring in all your medications bottles to every appointment.   If you have any questions or concerns before your next appointment please send [place] a message through [place] or call our office at [phone].    TO LEAVE A MESSAGE FOR THE NURSE SELECT OPTION 2, PLEASE LEAVE A MESSAGE INCLUDING: YOUR NAME DATE OF BIRTH CALL BACK NUMBER REASON FOR CALL**this is important as we prioritize the call backs  YOU WILL RECEIVE A CALL BACK THE SAME DAY AS LONG AS YOU CALL BEFORE 4:00 PM
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
## 2022-07-29 ENCOUNTER — Ambulatory Visit (HOSPITAL_BASED_OUTPATIENT_CLINIC_OR_DEPARTMENT_OTHER): Payer: Medicare PPO | Attending: Cardiology | Admitting: Cardiology

## 2022-07-29 VITALS — Ht 69.0 in | Wt 165.0 lb

## 2022-07-29 DIAGNOSIS — I480 Paroxysmal atrial fibrillation: Secondary | ICD-10-CM | POA: Insufficient documentation

## 2022-07-29 DIAGNOSIS — G4733 Obstructive sleep apnea (adult) (pediatric): Secondary | ICD-10-CM | POA: Diagnosis not present

## 2022-07-29 DIAGNOSIS — I5022 Chronic systolic (congestive) heart failure: Secondary | ICD-10-CM | POA: Diagnosis not present

## 2022-07-29 DIAGNOSIS — G4730 Sleep apnea, unspecified: Secondary | ICD-10-CM | POA: Diagnosis present

## 2022-07-29 DIAGNOSIS — I493 Ventricular premature depolarization: Secondary | ICD-10-CM | POA: Diagnosis not present

## 2022-07-30 NOTE — Procedures (Signed)
   Patient Name: Jon Oconnell, Jon Oconnell Date: 07/29/2022 Gender: Male D.O.B: 1950-12-12 Age (years): 51 Referring Provider: Fransico Him MD, ABSM Height (inches): 69 Interpreting Physician: Fransico Him MD, ABSM Weight (lbs): 165 RPSGT: Carolin Coy BMI: 24 MRN: [id number] Neck Size: 15.50  CLINICAL INFORMATION The patient is referred for a CPAP titration to treat sleep apnea.  SLEEP STUDY TECHNIQUE As per the AASM Manual for the Scoring of Sleep and Associated Events v2.3 (April 2016) with a hypopnea requiring 4% desaturations.  The channels recorded and monitored were frontal, central and occipital EEG, electrooculogram (EOG), submentalis EMG (chin), nasal and oral airflow, thoracic and abdominal wall motion, anterior tibialis EMG, snore microphone, electrocardiogram, and pulse oximetry. Continuous positive airway pressure (CPAP) was initiated at the beginning of the study and titrated to treat sleep-disordered breathing.  MEDICATIONS Medications self-administered by patient taken the night of the study : N/A  TECHNICIAN COMMENTS Comments added by technician: PATIENT WAS ORDERED AS A CPAP TITRATION. Comments added by scorer: N/A  RESPIRATORY PARAMETERS Optimal PAP Pressure (cm): 14  AHI at Optimal Pressure (/hr):4.8 Overall Minimal O2 (%):91.0  Supine % at Optimal Pressure (%): 0 Minimal O2 at Optimal Pressure (%): 91.0   SLEEP ARCHITECTURE The study was initiated at 9:57:41 PM and ended at 4:45:35 AM.  Sleep onset time was 2.9 minutes and the sleep efficiency was 84.0%. The total sleep time was 342.5 minutes.  The patient spent 17.1% of the night in stage N1 sleep, 65.3% in stage N2 sleep, 0.0% in stage N3 and 17.7% in REM.Stage REM latency was 107.0 minutes  Wake after sleep onset was 62.5. Alpha intrusion was absent. Supine sleep was 52.55%.  CARDIAC DATA The 2 lead EKG demonstrated sinus rhythm. The mean heart rate was 69.0 beats per minute. Other EKG findings  include: PVCs.  LEG MOVEMENT DATA The total Periodic Limb Movements of Sleep (PLMS) were 0. The PLMS index was 0.0. A PLMS index of <15 is considered normal in adults.  IMPRESSIONS - The optimal PAP pressure was 14 cm of water. - Central sleep apnea was not noted during this titration (CAI = 2.6/h). - Significant oxygen desaturations were not observed during this titration (min O2 = 91.0%). - No snoring was audible during this study. - 2-lead EKG demonstrated: PVCs - Clinically significant periodic limb movements were not noted during this study. Arousals associated with PLMs were rare.  DIAGNOSIS - Obstructive Sleep Apnea (G47.33)  RECOMMENDATIONS - Trial of ResMed CPAP therapy on 14 cm H2O with a Large size Fisher&Paykel Full Face Simplus mask and heated humidification. - Avoid alcohol, sedatives and other CNS depressants that may worsen sleep apnea and disrupt normal sleep architecture. - Sleep hygiene should be reviewed to assess factors that may improve sleep quality. - Weight management and regular exercise should be initiated or continued. - Return to [facility] Center for re-evaluation after 4 weeks of therapy  [Electronically signed] 07/30/2022 01:03 PM  Fransico Him MD, ABSM Diplomate, American Board of Sleep Medicine

## 2022-08-02 ENCOUNTER — Ambulatory Visit (INDEPENDENT_AMBULATORY_CARE_PROVIDER_SITE_OTHER): Payer: Medicare PPO

## 2022-08-02 DIAGNOSIS — I428 Other cardiomyopathies: Secondary | ICD-10-CM | POA: Diagnosis not present

## 2022-08-04 LAB — CUP PACEART REMOTE DEVICE CHECK
Battery Remaining Longevity: 32 mo
Battery Voltage: 2.94 V
Brady Statistic RV Percent Paced: 0.01 %
Date Time Interrogation Session: 20231111044229
HighPow Impedance: 67 Ohm
Implantable Lead Connection Status: 753985
Implantable Lead Implant Date: 20140818
Implantable Lead Location: 753860
Implantable Pulse Generator Implant Date: 20140818
Lead Channel Impedance Value: 323 Ohm
Lead Channel Impedance Value: 399 Ohm
Lead Channel Pacing Threshold Amplitude: 0.75 V
Lead Channel Pacing Threshold Pulse Width: 0.4 ms
Lead Channel Sensing Intrinsic Amplitude: 12 mV
Lead Channel Sensing Intrinsic Amplitude: 12 mV
Lead Channel Setting Pacing Amplitude: 2 V
Lead Channel Setting Pacing Pulse Width: 0.4 ms
Lead Channel Setting Sensing Sensitivity: 0.3 mV
Zone Setting Status: 755011

## 2022-08-06 ENCOUNTER — Ambulatory Visit (HOSPITAL_COMMUNITY)
Admission: RE | Admit: 2022-08-06 | Discharge: 2022-08-06 | Disposition: A | Discharge status: Home / Self Care | Payer: No Typology Code available for payment source | Source: Ambulatory Visit | Attending: Cardiology | Admitting: Cardiology

## 2022-08-06 DIAGNOSIS — I5022 Chronic systolic (congestive) heart failure: Secondary | ICD-10-CM | POA: Insufficient documentation

## 2022-08-06 LAB — BASIC METABOLIC PANEL
Anion gap: 8 (ref 5–15)
BUN: 23 mg/dL (ref 8–23)
CO2: 26 mmol/L (ref 22–32)
Calcium: 9.6 mg/dL (ref 8.9–10.3)
Chloride: 106 mmol/L (ref 98–111)
Creatinine, Ser: 1.67 mg/dL — ABNORMAL HIGH (ref 0.61–1.24)
GFR, Estimated: 43 mL/min — ABNORMAL LOW (ref >60)
Glucose, Bld: 119 mg/dL — ABNORMAL HIGH (ref 70–99)
Potassium: 4.4 mmol/L (ref 3.5–5.1)
Sodium: 140 mmol/L (ref 135–145)

## 2022-08-07 NOTE — Progress Notes (Signed)
Remote ICD transmission.   

## 2022-08-09 ENCOUNTER — Telehealth: Payer: Self-pay | Admitting: *Deleted

## 2022-08-09 NOTE — Telephone Encounter (Signed)
The patient has been notified of the result. Left detailed message on voicemail and informed patient to call back..Savonna Birchmeier Green, CMA   

## 2022-08-09 NOTE — Telephone Encounter (Signed)
-----   Message from Lauralee Evener, [place] sent at 08/05/2022  1:00 PM EST -----  ----- Message ----- From: Sueanne Margarita, MD Sent: 07/30/2022   1:05 PM EST To: Cv Div Sleep Studies  Please let patient know that they had a successful PAP titration and let DME know that orders are in EPIC.  Please set up 6 week OV with me.

## 2022-08-14 NOTE — Telephone Encounter (Signed)
The patient has been notified of the result and verbalized understanding.  All questions (if any) were answered. Marolyn Hammock, [place] 08/14/2022 2:19 PM    Patient had a consultation for the inspire device and he declines the cpap.

## 2022-08-20 ENCOUNTER — Telehealth (HOSPITAL_COMMUNITY): Payer: Self-pay | Admitting: *Deleted

## 2022-08-20 NOTE — Telephone Encounter (Signed)
Rhonda with [facility] called requesting a return call back bout pt. I called back waited on hold for 5 minutes and the call was disconnected.   Call back # [phone]

## 2022-09-03 ENCOUNTER — Ambulatory Visit: Payer: No Typology Code available for payment source | Attending: Cardiology | Admitting: Cardiology

## 2022-09-03 ENCOUNTER — Encounter: Payer: Self-pay | Admitting: Cardiology

## 2022-09-03 VITALS — BP 116/64 | HR 65 | Ht 69.0 in | Wt 171.4 lb

## 2022-09-03 DIAGNOSIS — I4819 Other persistent atrial fibrillation: Secondary | ICD-10-CM | POA: Diagnosis not present

## 2022-09-03 DIAGNOSIS — D6869 Other thrombophilia: Secondary | ICD-10-CM

## 2022-09-03 NOTE — Patient Instructions (Signed)
Medication Instructions:  Your physician has recommended you make the following change in your medication:  Stop Amiodarone  *If you need a refill on your cardiac medications before your next appointment, please call your pharmacy*   Lab Work: None ordered   Testing/Procedures: None ordered   Follow-Up: At [facility], you and your health needs are our priority.  As part of our continuing mission to provide you with exceptional heart care, we have created designated Provider Care Teams.  These Care Teams include your primary Cardiologist (physician) and Advanced Practice Providers (APPs -  Physician Assistants and Nurse Practitioners) who all work together to provide you with the care you need, when you need it.   Your next appointment:   6 month(s)  The format for your next appointment:   In Person  Provider:   You will see one of the following Advanced Practice Providers on your designated Care Team:   Tommye Standard, [place] Legrand Como "Jonni Sanger" Chalmers Cater, [place]    Thank you for choosing [facility]!!   Trinidad Curet, RN [phone]  Other Instructions   Important Information About Sugar

## 2022-09-03 NOTE — Progress Notes (Signed)
Electrophysiology Office Note   Date:  09/03/2022   ID:  Jon Oconnell, DOB [birth year], MRN [id number]  PCP:  [facility] [place]  Cardiologist:  Marica Otter Primary Electrophysiologist:  Corliss Coggeshall Meredith Leeds, MD    Chief Complaint: CHF   History of Present Illness: Jon Oconnell is a 71 y.o. male who is being seen today for the evaluation of CHF at the request of Kirk Ruths. Presenting today for electrophysiology evaluation.  He has a history of stent for nonischemic cardiomyopathy, prior Hodgkin's lymphoma, atrial fibrillation.  In 2014 he had left heart catheterization that showed no obstructive coronary artery disease.  He had a Medtronic ICD implanted.  He went to [facility] was found to have rapid atrial fibrillation.   He is post atrial fibrillation ablation 09/16/2019.  He again presented with worsening palpitations.  He is now status post repeat ablation 06/06/2022.  Today, denies symptoms of palpitations, chest pain, shortness of breath, orthopnea, PND, lower extremity edema, claudication, dizziness, presyncope, syncope, bleeding, or neurologic sequela. The patient is tolerating medications without difficulties.  Since his ablation he has done well.  He has had an improvement in his level of energy and he is not short of breath doing activities.  Overall quite happy with his control.   Past Medical History:  Diagnosis Date   AICD (automatic cardioverter/defibrillator) present    Atrial fibrillation with RVR ([place]) 04/08/2019   Cardiac defibrillator in place 04/08/2019   Chronic congestive heart failure ([place]) 04/08/2019   Depression with anxiety 04/08/2019   Dilated cardiomyopathy ([place]) 04/09/2019   Dyspnea    with exertion   Dysrhythmia    A-fib   Hyperlipidemia 04/08/2019   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   NICM (nonischemic cardiomyopathy) ([place])    Non-ischemic cardiomyopathy ([place]) 03/03/2017   Sleep apnea    Thrombus of left atrial appendage  without antecedent myocardial infarction    Past Surgical History:  Procedure Laterality Date   ATRIAL FIBRILLATION ABLATION N/A 09/16/2019   Procedure: ATRIAL FIBRILLATION ABLATION;  Surgeon: Constance Haw, MD;  Location: [place] [facility];  Service: Cardiovascular;  Laterality: N/A;   ATRIAL FIBRILLATION ABLATION N/A 06/06/2022   Procedure: ATRIAL FIBRILLATION ABLATION;  Surgeon: Constance Haw, MD;  Location: [place] [facility];  Service: Cardiovascular;  Laterality: N/A;   CARDIOVERSION N/A 05/19/2019   Procedure: CARDIOVERSION;  Surgeon: Larey Dresser, MD;  Location: [facility] ENDOSCOPY;  Service: Cardiovascular;  Laterality: N/A;   CARDIOVERSION N/A 01/18/2022   Procedure: CARDIOVERSION;  Surgeon: Larey Dresser, MD;  Location: [facility] ENDOSCOPY;  Service: Cardiovascular;  Laterality: N/A;   ICD IMPLANT Left    RIGHT/LEFT HEART CATH AND CORONARY ANGIOGRAPHY N/A 04/23/2019   Procedure: RIGHT/LEFT HEART CATH AND CORONARY ANGIOGRAPHY;  Surgeon: Larey Dresser, MD;  Location: [place] [facility];  Service: Cardiovascular;  Laterality: N/A;   TEE WITHOUT CARDIOVERSION N/A 05/19/2019   Procedure: TRANSESOPHAGEAL ECHOCARDIOGRAM (TEE);  Surgeon: Larey Dresser, MD;  Location: [facility] ENDOSCOPY;  Service: Cardiovascular;  Laterality: N/A;     Current Outpatient Medications  Medication Sig Dispense Refill   apixaban (ELIQUIS) 5 MG TABS tablet Take 1 tablet (5 mg total) by mouth 2 (two) times daily. 60 tablet 5   empagliflozin (JARDIANCE) 25 MG TABS tablet TAKE ONE-HALF TABLET BY MOUTH EVERY MORNING TO LOWER BLOOD SUGAR.TAKE BEFORE BREAKFAST. 15 tablet 11   eplerenone (INSPRA) 25 MG tablet Take 1 tablet (25 mg total) by mouth daily. 90 tablet 3   furosemide (LASIX) 20 MG  tablet Take 2 tablets (40 mg total) by mouth every other day. 90 tablet 2   ipratropium (ATROVENT) 0.02 % nebulizer solution Take 0.5 mg by nebulization every 6 (six) hours as needed for wheezing or shortness of breath.      metoprolol succinate (TOPROL-XL) 25 MG 24 hr tablet Take 1 tablet (25 mg total) by mouth at bedtime. 90 tablet 3   mirtazapine (REMERON) 15 MG tablet Take 15 mg by mouth at bedtime.     omeprazole (PRILOSEC) 40 MG capsule Take 40 mg by mouth daily.     potassium chloride (KLOR-CON) 10 MEQ tablet Take 2 tablets (20 mEq total) by mouth daily. 180 tablet 3   Rosuvastatin Calcium 20 MG CPSP Take 20 mg by mouth daily.     vitamin B-12 (CYANOCOBALAMIN) 500 MCG tablet Take 500 mcg by mouth daily.     sertraline (ZOLOFT) 50 MG tablet Take 50 mg by mouth daily.     No current facility-administered medications for this visit.    Allergies:   Lisinopril and Spironolactone   Social History:  The patient  reports that he has quit smoking. He has never used smokeless tobacco. He reports that he does not currently use alcohol. He reports that he does not use drugs.   Family History:  The patient's family history includes Breast cancer in his mother; Stroke in his father.   ROS:  Please see the history of present illness.   Otherwise, review of systems is positive for none.   All other systems are reviewed and negative.   PHYSICAL EXAM: VS:  BP 116/64   Pulse 65   Ht '5\' 9"'$  (1.753 m)   Wt 171 lb 6.4 oz (77.7 kg)   SpO2 97%   BMI 25.31 kg/m  , BMI Body mass index is 25.31 kg/m. GEN: Well nourished, well developed, in no acute distress  HEENT: normal  Neck: no JVD, carotid bruits, or masses Cardiac: RRR; no murmurs, rubs, or gallops,no edema  Respiratory:  clear to auscultation bilaterally, normal work of breathing GI: soft, nontender, nondistended, + BS MS: no deformity or atrophy  Skin: warm and dry, device site well healed Neuro:  Strength and sensation are intact Psych: euthymic mood, full affect  EKG:  EKG is ordered today. Personal review of the ekg ordered shows this rhythm, first-degree AV block, rate 65  Personal review of the device interrogation today. Results in [place]: 02/12/2022: B Natriuretic Peptide 1,513.8 05/22/2022: ALT 38; TSH 2.059 06/06/2022: Hemoglobin 15.6; Platelets 167 08/06/2022: BUN 23; Creatinine, Ser 1.67; Potassium 4.4; Sodium 140    Lipid Panel     Component Value Date/Time   CHOL 192 05/22/2022 1055   TRIG 72 05/22/2022 1055   HDL 70 05/22/2022 1055   CHOLHDL 2.7 05/22/2022 1055   VLDL 14 05/22/2022 1055   LDLCALC 108 (H) 05/22/2022 1055     Wt Readings from Last 3 Encounters:  09/03/22 171 lb 6.4 oz (77.7 kg)  07/29/22 165 lb (74.8 kg)  07/22/22 172 lb 6.4 oz (78.2 kg)      Other studies Reviewed: Additional studies/ records that were reviewed today include: TEE 01/21/2020 Review of the above records today demonstrates:   1. Left ventricular ejection fraction, by estimation, is 55%. The left  ventricle has normal function. The left ventricle has no regional wall  motion abnormalities. Left ventricular diastolic parameters are consistent  with Grade I diastolic dysfunction  (impaired relaxation).   2. Right ventricular  systolic function is normal. The right ventricular  size is normal. There is normal pulmonary artery systolic pressure. The  estimated right ventricular systolic pressure is 03.5 mmHg.   3. Left atrial size was mildly dilated.   4. The mitral valve is normal in structure. No evidence of mitral valve  regurgitation. No evidence of mitral stenosis.   5. The aortic valve is tricuspid. Aortic valve regurgitation is not  visualized. No aortic stenosis is present.   6. The inferior vena cava is normal in size with greater than 50%  respiratory variability, suggesting right atrial pressure of 3 mmHg.    ASSESSMENT AND PLAN:  1.  Chronic systolic heart failure: Due to nonischemic cardiomyopathy.  Currently on optimal medical therapy per general cardiology.  Ejection fraction has normalized after atrial fibrillation ablation.  Status post Medtronic ICD.  Device function appropriately.  No changes at this  time.  2.  Persistent atrial fibrillation: Currently on Eliquis 5 mg twice daily, amiodarone 200 mg daily.  Status post ablation 1220 09/12/2019 with repeat ablation 06/06/2022.  He has had no further episodes of atrial fibrillation.  Lekita Kerekes stop amiodarone today.  3.  Secondary hypercoagulable state: Currently on Eliquis for atrial fibrillation as above  4.  Obstructive sleep apnea: CPAP compliance encouraged Current medicines are reviewed at length with the patient today.   The patient does not have concerns regarding his medicines.  The following changes were made today: Stop amiodarone   Labs/ tests ordered today include:  Orders Placed This Encounter  Procedures   EKG 12-Lead     Disposition:   FU 6 months  Signed, Vrinda Heckstall Meredith Leeds, MD  09/03/2022 2:27 PM     [place] [address] [phone] (office) [phone] (fax)

## 2022-11-01 ENCOUNTER — Ambulatory Visit: Payer: No Typology Code available for payment source

## 2022-11-01 DIAGNOSIS — I428 Other cardiomyopathies: Secondary | ICD-10-CM | POA: Diagnosis not present

## 2022-11-01 LAB — CUP PACEART REMOTE DEVICE CHECK
Battery Remaining Longevity: 30 mo
Battery Voltage: 2.95 V
Brady Statistic RV Percent Paced: 0.01 %
Date Time Interrogation Session: 20240209033324
HighPow Impedance: 64 Ohm
Implantable Lead Connection Status: 753985
Implantable Lead Implant Date: 20140818
Implantable Lead Location: 753860
Implantable Pulse Generator Implant Date: 20140818
Lead Channel Impedance Value: 285 Ohm
Lead Channel Impedance Value: 380 Ohm
Lead Channel Pacing Threshold Amplitude: 0.875 V
Lead Channel Pacing Threshold Pulse Width: 0.4 ms
Lead Channel Sensing Intrinsic Amplitude: 12 mV
Lead Channel Sensing Intrinsic Amplitude: 12 mV
Lead Channel Setting Pacing Amplitude: 2 V
Lead Channel Setting Pacing Pulse Width: 0.4 ms
Lead Channel Setting Sensing Sensitivity: 0.3 mV
Zone Setting Status: 755011

## 2022-11-19 NOTE — Progress Notes (Signed)
Remote ICD transmission.   

## 2023-01-31 ENCOUNTER — Ambulatory Visit (INDEPENDENT_AMBULATORY_CARE_PROVIDER_SITE_OTHER): Payer: Medicare PPO

## 2023-01-31 DIAGNOSIS — I428 Other cardiomyopathies: Secondary | ICD-10-CM | POA: Diagnosis not present

## 2023-01-31 LAB — CUP PACEART REMOTE DEVICE CHECK
Battery Remaining Longevity: 28 mo
Battery Voltage: 2.94 V
Brady Statistic RV Percent Paced: 0 %
Date Time Interrogation Session: 20240510082825
HighPow Impedance: 71 Ohm
Implantable Lead Connection Status: 753985
Implantable Lead Implant Date: 20140818
Implantable Lead Location: 753860
Implantable Pulse Generator Implant Date: 20140818
Lead Channel Impedance Value: 323 Ohm
Lead Channel Impedance Value: 380 Ohm
Lead Channel Pacing Threshold Amplitude: 0.875 V
Lead Channel Pacing Threshold Pulse Width: 0.4 ms
Lead Channel Sensing Intrinsic Amplitude: 15.125 mV
Lead Channel Sensing Intrinsic Amplitude: 15.125 mV
Lead Channel Setting Pacing Amplitude: 2 V
Lead Channel Setting Pacing Pulse Width: 0.4 ms
Lead Channel Setting Sensing Sensitivity: 0.3 mV
Zone Setting Status: 755011

## 2023-02-18 NOTE — Progress Notes (Signed)
Remote ICD transmission.   

## 2023-03-05 NOTE — Progress Notes (Signed)
  Electrophysiology Office Note:   Date:  03/06/2023  ID:  Jon Oconnell, DOB 05/13/1951, MRN [id number]  Primary Cardiologist: Kristeen Miss, MD Electrophysiologist: Regan Lemming, MD      History of Present Illness:   Jon Oconnell is a 72 y.o. male with h/o CHF s/p ICD, AF s/p ablation 05/2022, HTN, and OSA seen today for routine electrophysiology followup.   Since last being seen in our clinic the patient reports doing well from a cardiac perspective.   Had + OSA and is using CPAP. Still very interested in [place] device.  he denies chest pain, palpitations, dyspnea, PND, orthopnea, nausea, vomiting, dizziness, syncope, edema, weight gain, or early satiety.   Review of systems complete and found to be negative unless listed in HPI.   Device History: Medtronic Single Chamber ICD implanted 2014 for CHF   Studies Reviewed:    ICD Interrogation-  reviewed in detail today,  See PACEART report.  EKG is ordered today. Personal review shows NSR at 86 bpm 1AVB   Physical Exam:   VS:  BP 120/72   Pulse 86   Ht 5\' 9"  (1.753 m)   Wt 182 lb (82.6 kg)   SpO2 98%   BMI 26.88 kg/m    Wt Readings from Last 3 Encounters:  03/06/23 182 lb (82.6 kg)  09/03/22 171 lb 6.4 oz (77.7 kg)  07/29/22 165 lb (74.8 kg)     GEN: Well nourished, well developed in no acute distress NECK: No JVD; No carotid bruits CARDIAC: Regular rate and rhythm, no murmurs, rubs, gallops RESPIRATORY:  Clear to auscultation without rales, wheezing or rhonchi  ABDOMEN: Soft, non-tender, non-distended EXTREMITIES:  No edema; No deformity   ASSESSMENT AND PLAN:    Chronic systolic dysfunction s/p Medtronic single chamber ICD  euvolemic today Stable on an appropriate medical regimen Normal ICD function See [facility] [facility] report No changes today  Persistent Atrial fibrillation EKG today shows NSR S/p ablation 05/2022 Continue eliquis for CHA2DS2/VASc of least 2.   OSA  Encouraged nightly CPAP  He is very  interested in [place] device  HTN Stable on current regimen   Disposition:   Follow up with Dr. Elberta Fortis in 12 months   Signed, Graciella Freer, PA-C

## 2023-03-06 ENCOUNTER — Ambulatory Visit: Payer: Medicare Other | Attending: Student | Admitting: Student

## 2023-03-06 ENCOUNTER — Encounter: Payer: Self-pay | Admitting: Student

## 2023-03-06 VITALS — BP 120/72 | HR 86 | Ht 69.0 in | Wt 182.0 lb

## 2023-03-06 DIAGNOSIS — I428 Other cardiomyopathies: Secondary | ICD-10-CM

## 2023-03-06 DIAGNOSIS — I10 Essential (primary) hypertension: Secondary | ICD-10-CM | POA: Diagnosis present

## 2023-03-06 DIAGNOSIS — I4819 Other persistent atrial fibrillation: Secondary | ICD-10-CM

## 2023-03-06 DIAGNOSIS — G4733 Obstructive sleep apnea (adult) (pediatric): Secondary | ICD-10-CM

## 2023-03-06 LAB — CUP PACEART INCLINIC DEVICE CHECK
Battery Remaining Longevity: 26 mo
Battery Voltage: 2.94 V
Brady Statistic RV Percent Paced: 0 %
Date Time Interrogation Session: 20240613113318
HighPow Impedance: 70 Ohm
Implantable Lead Connection Status: 753985
Implantable Lead Implant Date: 20140818
Implantable Lead Location: 753860
Implantable Pulse Generator Implant Date: 20140818
Lead Channel Impedance Value: 323 Ohm
Lead Channel Impedance Value: 399 Ohm
Lead Channel Pacing Threshold Amplitude: 0.75 V
Lead Channel Pacing Threshold Pulse Width: 0.4 ms
Lead Channel Sensing Intrinsic Amplitude: 11.75 mV
Lead Channel Sensing Intrinsic Amplitude: 15.625 mV
Lead Channel Setting Pacing Amplitude: 2 V
Lead Channel Setting Pacing Pulse Width: 0.4 ms
Lead Channel Setting Sensing Sensitivity: 0.3 mV
Zone Setting Status: 755011

## 2023-03-06 NOTE — Patient Instructions (Signed)
Medication Instructions:  Your physician recommends that you continue on your current medications as directed. Please refer to the Current Medication list given to you today.  *If you need a refill on your cardiac medications before your next appointment, please call your pharmacy*  Lab Work: None ordered If you have labs (blood work) drawn today and your tests are completely normal, you will receive your results only by: MyChart Message (if you have MyChart) OR A paper copy in the mail If you have any lab test that is abnormal or we need to change your treatment, we will call you to review the results.  Follow-Up: At [facility], you and your health needs are our priority.  As part of our continuing mission to provide you with exceptional heart care, we have created designated Provider Care Teams.  These Care Teams include your primary Cardiologist (physician) and Advanced Practice Providers (APPs -  Physician Assistants and Nurse Practitioners) who all work together to provide you with the care you need, when you need it.  Your next appointment:   1 year(s)  Provider:   Loman Brooklyn, MD  Schedule follow up appointment with Dr Mayford Knife to discuss inspire.

## 2023-04-07 ENCOUNTER — Encounter (HOSPITAL_COMMUNITY): Payer: Self-pay

## 2023-04-07 ENCOUNTER — Ambulatory Visit (HOSPITAL_COMMUNITY)
Admission: RE | Admit: 2023-04-07 | Discharge: 2023-04-07 | Disposition: A | Discharge status: Home / Self Care | Payer: Medicare Other | Source: Ambulatory Visit | Attending: Family Medicine | Admitting: Family Medicine

## 2023-04-07 ENCOUNTER — Telehealth (HOSPITAL_COMMUNITY): Payer: Self-pay

## 2023-04-07 VITALS — BP 94/62 | HR 82 | Wt 170.0 lb

## 2023-04-07 DIAGNOSIS — Z7984 Long term (current) use of oral hypoglycemic drugs: Secondary | ICD-10-CM | POA: Insufficient documentation

## 2023-04-07 DIAGNOSIS — I480 Paroxysmal atrial fibrillation: Secondary | ICD-10-CM

## 2023-04-07 DIAGNOSIS — Z79899 Other long term (current) drug therapy: Secondary | ICD-10-CM | POA: Insufficient documentation

## 2023-04-07 DIAGNOSIS — G4733 Obstructive sleep apnea (adult) (pediatric): Secondary | ICD-10-CM | POA: Diagnosis not present

## 2023-04-07 DIAGNOSIS — Z87891 Personal history of nicotine dependence: Secondary | ICD-10-CM | POA: Insufficient documentation

## 2023-04-07 DIAGNOSIS — Z823 Family history of stroke: Secondary | ICD-10-CM | POA: Diagnosis not present

## 2023-04-07 DIAGNOSIS — Z8616 Personal history of COVID-19: Secondary | ICD-10-CM | POA: Insufficient documentation

## 2023-04-07 DIAGNOSIS — N183 Chronic kidney disease, stage 3 unspecified: Secondary | ICD-10-CM | POA: Diagnosis not present

## 2023-04-07 DIAGNOSIS — Q245 Malformation of coronary vessels: Secondary | ICD-10-CM

## 2023-04-07 DIAGNOSIS — I5022 Chronic systolic (congestive) heart failure: Secondary | ICD-10-CM

## 2023-04-07 DIAGNOSIS — Z95810 Presence of automatic (implantable) cardiac defibrillator: Secondary | ICD-10-CM | POA: Insufficient documentation

## 2023-04-07 DIAGNOSIS — Z7901 Long term (current) use of anticoagulants: Secondary | ICD-10-CM | POA: Insufficient documentation

## 2023-04-07 DIAGNOSIS — I4891 Unspecified atrial fibrillation: Secondary | ICD-10-CM | POA: Insufficient documentation

## 2023-04-07 DIAGNOSIS — Z8572 Personal history of non-Hodgkin lymphomas: Secondary | ICD-10-CM | POA: Diagnosis not present

## 2023-04-07 DIAGNOSIS — Z87898 Personal history of other specified conditions: Secondary | ICD-10-CM

## 2023-04-07 DIAGNOSIS — N1831 Chronic kidney disease, stage 3a: Secondary | ICD-10-CM

## 2023-04-07 LAB — CBC
HCT: 46.9 % (ref 39.0–52.0)
Hemoglobin: 15.2 g/dL (ref 13.0–17.0)
MCH: 27.6 pg (ref 26.0–34.0)
MCHC: 32.4 g/dL (ref 30.0–36.0)
MCV: 85.3 fL (ref 80.0–100.0)
Platelets: 157 10*3/uL (ref 150–400)
RBC: 5.5 MIL/uL (ref 4.22–5.81)
RDW: 13.5 % (ref 11.5–15.5)
WBC: 8.3 10*3/uL (ref 4.0–10.5)
nRBC: 0 % (ref 0.0–0.2)

## 2023-04-07 LAB — BASIC METABOLIC PANEL
Anion gap: 9 (ref 5–15)
BUN: 13 mg/dL (ref 8–23)
CO2: 25 mmol/L (ref 22–32)
Calcium: 9.8 mg/dL (ref 8.9–10.3)
Chloride: 106 mmol/L (ref 98–111)
Creatinine, Ser: 1.44 mg/dL — ABNORMAL HIGH (ref 0.61–1.24)
GFR, Estimated: 51 mL/min — ABNORMAL LOW (ref >60)
Glucose, Bld: 89 mg/dL (ref 70–99)
Potassium: 4.1 mmol/L (ref 3.5–5.1)
Sodium: 140 mmol/L (ref 135–145)

## 2023-04-07 LAB — BRAIN NATRIURETIC PEPTIDE: B Natriuretic Peptide: 589.1 pg/mL — ABNORMAL HIGH (ref 0.0–100.0)

## 2023-04-07 NOTE — Telephone Encounter (Signed)
Patient advised and verbalized understanding,med list updated to reflect changes,lab orders entered. Patient will have labs done at [facility].

## 2023-04-07 NOTE — Telephone Encounter (Signed)
-----   Message from [place] sent at 04/07/2023  3:45 PM EDT ----- BNP elevated, suggesting volume up.  Change Lasix from 40 mg every other day, to 40 mg daily alternating with 20 mg every other day.  Repeat BMET in 10-14 days. OK to get at [place] (he lives in [place])
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
## 2023-04-07 NOTE — Patient Instructions (Addendum)
Labs done today. We will contact you only if your labs are abnormal.  No medication changes were made. Please continue all current medications as prescribed.  Your physician recommends that you schedule a follow-up appointment in: 3 months with an echo. Please contact our office in August to schedule a October appointment.   If you have any questions or concerns before your next appointment please send [place] a message through [place] or call our office at [phone].    TO LEAVE A MESSAGE FOR THE NURSE SELECT OPTION 2, PLEASE LEAVE A MESSAGE INCLUDING: YOUR NAME DATE OF BIRTH CALL BACK NUMBER REASON FOR CALL**this is important as we prioritize the call backs  YOU WILL RECEIVE A CALL BACK THE SAME DAY AS LONG AS YOU CALL BEFORE 4:00 PM   Do the following things EVERYDAY: Weigh yourself in the morning before breakfast. Write it down and keep it in a log. Take your medicines as prescribed Eat low salt foods--Limit salt (sodium) to 2000 mg per day.  Stay as active as you can everyday Limit all fluids for the day to less than 2 liters   At the [facility], you and your health needs are our priority. As part of our continuing mission to provide you with exceptional heart care, we have created designated Provider Care Teams. These Care Teams include your primary Cardiologist (physician) and Advanced Practice Providers (APPs- Physician Assistants and Nurse Practitioners) who all work together to provide you with the care you need, when you need it.   You may see any of the following providers on your designated Care Team at your next follow up: Dr Arvilla Meres Dr Marca Ancona Dr. Marcos Eke, NP Robbie Lis, [place] [facility] [place], [place] Brynda Peon, NP Karle Plumber, PharmD   Please be sure to bring in all your medications bottles to every appointment.    Thank you for choosing [facility]

## 2023-05-02 ENCOUNTER — Ambulatory Visit (INDEPENDENT_AMBULATORY_CARE_PROVIDER_SITE_OTHER): Payer: Self-pay

## 2023-05-02 DIAGNOSIS — I5022 Chronic systolic (congestive) heart failure: Secondary | ICD-10-CM

## 2023-05-02 DIAGNOSIS — I428 Other cardiomyopathies: Secondary | ICD-10-CM

## 2023-05-02 LAB — CUP PACEART REMOTE DEVICE CHECK
Battery Remaining Longevity: 24 mo
Battery Voltage: 2.93 V
Brady Statistic RV Percent Paced: 0 %
Date Time Interrogation Session: 20240809033625
HighPow Impedance: 76 Ohm
Implantable Lead Connection Status: 753985
Implantable Lead Implant Date: 20140818
Implantable Lead Location: 753860
Implantable Pulse Generator Implant Date: 20140818
Lead Channel Impedance Value: 342 Ohm
Lead Channel Impedance Value: 437 Ohm
Lead Channel Pacing Threshold Amplitude: 0.75 V
Lead Channel Pacing Threshold Pulse Width: 0.4 ms
Lead Channel Sensing Intrinsic Amplitude: 14.125 mV
Lead Channel Sensing Intrinsic Amplitude: 14.125 mV
Lead Channel Setting Pacing Amplitude: 2 V
Lead Channel Setting Pacing Pulse Width: 0.4 ms
Lead Channel Setting Sensing Sensitivity: 0.3 mV
Zone Setting Status: 755011

## 2023-05-16 NOTE — Progress Notes (Signed)
Remote ICD transmission.   

## 2023-07-25 ENCOUNTER — Telehealth: Payer: Self-pay | Admitting: Cardiology

## 2023-07-25 NOTE — Telephone Encounter (Signed)
07/25/23 LVM to r/s 11/05 appt, you can send to me - LCN

## 2023-07-29 ENCOUNTER — Ambulatory Visit: Payer: No Typology Code available for payment source | Admitting: Cardiology

## 2023-07-31 ENCOUNTER — Encounter: Payer: Self-pay | Admitting: Cardiology

## 2023-07-31 ENCOUNTER — Ambulatory Visit: Payer: No Typology Code available for payment source | Attending: Cardiology | Admitting: Cardiology

## 2023-07-31 VITALS — BP 98/60 | HR 86 | Ht 69.0 in | Wt 174.0 lb

## 2023-07-31 DIAGNOSIS — I10 Essential (primary) hypertension: Secondary | ICD-10-CM

## 2023-07-31 DIAGNOSIS — G4733 Obstructive sleep apnea (adult) (pediatric): Secondary | ICD-10-CM

## 2023-07-31 NOTE — Patient Instructions (Signed)
Medication Instructions:  Your physician recommends that you continue on your current medications as directed. Please refer to the Current Medication list given to you today.  *If you need a refill on your cardiac medications before your next appointment, please call your pharmacy*   Lab Work: None.  If you have labs (blood work) drawn today and your tests are completely normal, you will receive your results only by: MyChart Message (if you have MyChart) OR A paper copy in the mail If you have any lab test that is abnormal or we need to change your treatment, we will call you to review the results.   Testing/Procedures: None.   Follow-Up: At [facility], you and your health needs are our priority.  As part of our continuing mission to provide you with exceptional heart care, we have created designated Provider Care Teams.  These Care Teams include your primary Cardiologist (physician) and Advanced Practice Providers (APPs -  Physician Assistants and Nurse Practitioners) who all work together to provide you with the care you need, when you need it.  We recommend signing up for the patient portal called "MyChart".  Sign up information is provided on this After Visit Summary.  MyChart is used to connect with patients for Virtual Visits (Telemedicine).  Patients are able to view lab/test results, encounter notes, upcoming appointments, etc.  Non-urgent messages can be sent to your provider as well.   To learn more about what you can do with MyChart, go to [email or web address].    Your next appointment:   1 year(s)  Provider:   Dr. Armanda Magic, MD   Other Instructions You have been referred to Dr. Christia Reading, ENT for evaluation for [facility] implant. Someone from that office will call you to schedule an appointment.

## 2023-07-31 NOTE — Addendum Note (Signed)
Addended by: Luellen Pucker on: 07/31/2023 02:09 PM   Modules accepted: Orders

## 2023-07-31 NOTE — Progress Notes (Signed)
Sleep medicine  Note    Date:  07/31/2023   ID:  Jon Oconnell, DOB Sep 24, 1950, MRN [id number]  PCP:  [facility]  Cardiologist:  Armanda Magic, MD   Chief Complaint  Patient presents with   Sleep Apnea   Hypertension    History of Present Illness:  Jon Oconnell is a 72 y.o. male with a history of atrial fibrillation, chronic systolic CHF status post AICD for nonischemic dilated cardiomyopathy, hyperlipidemia and obstructive sleep apnea unable to tolerate CPAP.    He was diagnosed with OSA  several years ago.  He was initially on CPAP and had been using it off and on but could not get used to it enough to use on a nightly basis.  He was using a nasal pillow mask because he could not tolerate the FFM due to claustrophobia and air hunger.  He then start having problems with the nasal mask moving around in his sleep and he would have to constantly repositioning it feeling very tired in the morning from lack of sleep.   He underwent a repeat sleep study which showed severe obstructive sleep apnea with an AHI of 30.3/h and mild central sleep apnea with the central AHI of 10/h.   He underwent CPAP titration a year ago and was titrated to CPAP 14 cm H2O.  He never followed back up with me after that.  He was seen back by Prince Rome NP in advanced heart failure clinic stating that he was unable to tolerate the CPAP and was interested in seeing if he would be a candidate for the inspire device.  He is now referred back to discuss the possibility of getting evaluated for inspire.  He says that he tried the nasal pillow mask, nasal mask and full face mask and he gets claustraphobic with all of them and gets very frustrated.  He feels he cannot breathe with them on.    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     Past Surgical History:  Procedure Laterality Date   ATRIAL FIBRILLATION ABLATION N/A 09/16/2019   Procedure: ATRIAL FIBRILLATION ABLATION;  Surgeon: Regan Lemming, MD;  Location: [facility] INVASIVE CV LAB;  Service: Cardiovascular;  Laterality: N/A;   ATRIAL FIBRILLATION ABLATION N/A 06/06/2022   Procedure: ATRIAL FIBRILLATION ABLATION;  Surgeon: Regan Lemming, MD;  Location: [facility] INVASIVE CV LAB;  Service: Cardiovascular;  Laterality: N/A;   CARDIOVERSION N/A 05/19/2019   Procedure: CARDIOVERSION;  Surgeon: Laurey Morale, MD;  Location: [facility] ENDOSCOPY;  Service: Cardiovascular;  Laterality: N/A;   CARDIOVERSION N/A 01/18/2022   Procedure: CARDIOVERSION;  Surgeon: Laurey Morale, MD;  Location: [facility] ENDOSCOPY;  Service: Cardiovascular;  Laterality: N/A;   ICD IMPLANT Left    RIGHT/LEFT HEART CATH AND CORONARY ANGIOGRAPHY N/A 04/23/2019   Procedure: RIGHT/LEFT HEART CATH AND CORONARY ANGIOGRAPHY;  Surgeon: Laurey Morale, MD;  Location: [facility] INVASIVE CV LAB;  Service: Cardiovascular;  Laterality: N/A;  TEE WITHOUT CARDIOVERSION N/A 05/19/2019   Procedure: TRANSESOPHAGEAL ECHOCARDIOGRAM (TEE);  Surgeon: Laurey Morale, MD;  Location: [facility] ENDOSCOPY;  Service: Cardiovascular;  Laterality: N/A;    Current Medications: Current Meds  Medication Sig   apixaban (ELIQUIS) 5 MG TABS tablet Take 1 tablet (5 mg total) by mouth 2 (two) times daily.   empagliflozin (JARDIANCE) 25 MG TABS tablet TAKE ONE-HALF TABLET BY MOUTH EVERY MORNING TO LOWER BLOOD SUGAR.TAKE BEFORE BREAKFAST.   eplerenone (INSPRA) 25  MG tablet Take 1 tablet (25 mg total) by mouth daily.   furosemide (LASIX) 20 MG tablet Take 2 tablets (40 mg total) by mouth every other day.   ipratropium (ATROVENT) 0.02 % nebulizer solution Take 0.5 mg by nebulization every 6 (six) hours as needed for wheezing or shortness of breath.   metoprolol succinate (TOPROL-XL) 25 MG 24 hr tablet Take 1 tablet (25 mg total) by mouth at bedtime.   mirtazapine (REMERON) 15 MG tablet Take 15 mg by mouth at bedtime.   omeprazole (PRILOSEC) 40 MG capsule Take 40 mg by mouth daily.   potassium chloride (KLOR-CON) 10 MEQ tablet Take 2 tablets (20 mEq total) by mouth daily.   Rosuvastatin Calcium 20 MG CPSP Take 20 mg by mouth daily.   sertraline (ZOLOFT) 50 MG tablet Take 50 mg by mouth daily.   vitamin B-12 (CYANOCOBALAMIN) 500 MCG tablet Take 500 mcg by mouth daily.    Allergies:   Lisinopril and Spironolactone   Social History   Socioeconomic History   Marital status: Married    Spouse name: Not on file   Number of children: Not on file   Years of education: Not on file   Highest education level: Not on file  Occupational History   Not on file  Tobacco Use   Smoking status: Former   Smokeless tobacco: Never   Tobacco comments:    Former smoker 07/04/22  Substance and Sexual Activity   Alcohol use: Not Currently   Drug use: Never   Sexual activity: Not Currently  Other Topics Concern   Not on file  Social History Narrative   Not on file   Social Determinants of Health   Financial Resource Strain: Low Risk  (02/01/2022)   [facility]   Overall Financial Resource Strain (CARDIA)    Difficulty of Paying Living Expenses: Not hard at all  Food Insecurity: No Food Insecurity (02/01/2022)   Received from [facility], [facility]   Hunger Vital Sign    Worried About Running Out of Food in the Last Year: Never true    Ran Out of Food in the Last Year: Never true  Transportation Needs: No  Transportation Needs (02/01/2022)   Received from [facility], [facility]   [facility] - Transportation    Lack of Transportation (Medical): No    Lack of Transportation (Non-Medical): No  Physical Activity: Insufficiently Active (02/01/2022)   Received from [facility], [facility]   Exercise Vital Sign    Days of Exercise per Week: 6 days    Minutes of Exercise per Session: 20 min  Stress: Stress Concern Present (02/01/2022)   Received from [facility], [facility] - Occupational Stress Questionnaire    Feeling of Stress : Rather much  Social Connections: Moderately Isolated (02/01/2022)   Received from [facility], [facility]   Social Connection and Isolation Panel [  NHANES]    Frequency of Communication with Friends and Family: More than three times a week    Frequency of Social Gatherings with Friends and Family: More than three times a week    Attends Religious Services: Never    Database administrator or Organizations: No    Attends Banker Meetings: Never    Marital Status: Married     Family History:  The patient's family history includes Breast cancer in his mother; Stroke in his father.   ROS:   Please see the history of present illness.    ROS All other systems reviewed and are negative.      No data to display             PHYSICAL EXAM:   VS:  BP 98/60   Pulse 86   Ht 5\' 9"  (1.753 m)   Wt 174 lb (78.9 kg)   SpO2 98%   BMI 25.70 kg/m    GEN: Well nourished, well developed in no acute distress HEENT: Normal NECK: No JVD; No carotid bruits LYMPHATICS: No lymphadenopathy CARDIAC:RRR, no murmurs, rubs, gallops RESPIRATORY:  Clear to auscultation without rales, wheezing or rhonchi  ABDOMEN: Soft, non-tender, non-distended MUSCULOSKELETAL:  No edema; No deformity  SKIN: Warm and dry NEUROLOGIC:  Alert and oriented x 3 PSYCHIATRIC:  Normal affect  Wt Readings from Last 3  Encounters:  07/31/23 174 lb (78.9 kg)  04/07/23 170 lb (77.1 kg)  03/06/23 182 lb (82.6 kg)      Studies/Labs Reviewed:   EKG:  EKG is not ordered today.    Recent Labs: 04/07/2023: B Natriuretic Peptide 589.1; BUN 13; Creatinine, Ser 1.44; Hemoglobin 15.2; Platelets 157; Potassium 4.1; Sodium 140   Lipid Panel    Component Value Date/Time   CHOL 192 05/22/2022 1055   TRIG 72 05/22/2022 1055   HDL 70 05/22/2022 1055   CHOLHDL 2.7 05/22/2022 1055   VLDL 14 05/22/2022 1055   LDLCALC 108 (H) 05/22/2022 1055   Additional studies/ records that were reviewed today include:  Office visit notes from advanced heart failure and EP clinic    ASSESSMENT:    1. OSA (obstructive sleep apnea)   2. Essential hypertension       PLAN:  In order of problems listed above:  OSA -He has a history of obstructive sleep apnea and had tried CPAP multiple times in the past but did not tolerate due to intolerance to the mask from severe claustrophobia -Repeat sleep study a year ago showed severe obstructive sleep apnea with an AHI of 30/h -Repeat CPAP titration with a trial of CPAP at 14 cm H2O failed because the patient was completely intolerant to his masks due to severe claustrophobia -He is now interested in the inspire device. -I am not sure he will be a candidate for the inspire device as his central AHI was 10/h out of a total of 40 events/hr (30/hr OSA and 10/hr CSA)but we will go ahead and refer to Dr. Jenne Pane for further evaluation.  Hypertension -BP controlled on exam today -Continue prescription drug management with Toprol-XL 25 mg daily as well as eplerenone 25 mg daily with as needed refills   Time Spent: 15 minutes total time of encounter, including 10 minutes spent in face-to-face patient care on the date of this encounter. This time includes coordination of care and counseling regarding above mentioned problem list. Remainder of non-face-to-face time involved reviewing chart  documents/testing relevant to the patient encounter and documentation  in the medical record. I have independently reviewed documentation from referring provider  Medication Adjustments/Labs and Tests Ordered: Current medicines are reviewed at length with the patient today.  Concerns regarding medicines are outlined above.  Medication changes, Labs and Tests ordered today are listed in the Patient Instructions below.  There are no Patient Instructions on file for this visit.   Signed, Armanda Magic, MD  07/31/2023 1:58 PM    [facility] [address] [place], [place], [place]  [address] Phone: [phone]; Fax: [phone]

## 2023-08-01 ENCOUNTER — Ambulatory Visit (INDEPENDENT_AMBULATORY_CARE_PROVIDER_SITE_OTHER): Payer: Self-pay

## 2023-08-01 DIAGNOSIS — I428 Other cardiomyopathies: Secondary | ICD-10-CM

## 2023-08-04 LAB — CUP PACEART REMOTE DEVICE CHECK
Battery Remaining Longevity: 21 mo
Battery Voltage: 2.92 V
Brady Statistic RV Percent Paced: 0 %
Date Time Interrogation Session: 20241108123624
HighPow Impedance: 72 Ohm
Implantable Lead Connection Status: 753985
Implantable Lead Implant Date: 20140818
Implantable Lead Location: 753860
Implantable Pulse Generator Implant Date: 20140818
Lead Channel Impedance Value: 323 Ohm
Lead Channel Impedance Value: 437 Ohm
Lead Channel Pacing Threshold Amplitude: 0.625 V
Lead Channel Pacing Threshold Pulse Width: 0.4 ms
Lead Channel Sensing Intrinsic Amplitude: 15.375 mV
Lead Channel Sensing Intrinsic Amplitude: 15.375 mV
Lead Channel Setting Pacing Amplitude: 2 V
Lead Channel Setting Pacing Pulse Width: 0.4 ms
Lead Channel Setting Sensing Sensitivity: 0.3 mV
Zone Setting Status: 755011

## 2023-08-12 NOTE — Progress Notes (Signed)
Remote ICD transmission.   

## 2023-10-31 ENCOUNTER — Ambulatory Visit: Payer: Self-pay

## 2024-01-30 ENCOUNTER — Ambulatory Visit: Payer: Self-pay

## 2024-02-06 ENCOUNTER — Ambulatory Visit (INDEPENDENT_AMBULATORY_CARE_PROVIDER_SITE_OTHER): Payer: Self-pay

## 2024-02-06 DIAGNOSIS — I428 Other cardiomyopathies: Secondary | ICD-10-CM

## 2024-02-09 ENCOUNTER — Ambulatory Visit: Payer: Self-pay | Admitting: Cardiology

## 2024-02-09 LAB — CUP PACEART REMOTE DEVICE CHECK
Battery Remaining Longevity: 16 mo
Battery Voltage: 2.89 V
Brady Statistic RV Percent Paced: 0 %
Date Time Interrogation Session: 20250516130658
HighPow Impedance: 67 Ohm
Implantable Lead Connection Status: 753985
Implantable Lead Implant Date: 20140818
Implantable Lead Location: 753860
Implantable Pulse Generator Implant Date: 20140818
Lead Channel Impedance Value: 323 Ohm
Lead Channel Impedance Value: 380 Ohm
Lead Channel Pacing Threshold Amplitude: 0.75 V
Lead Channel Pacing Threshold Pulse Width: 0.4 ms
Lead Channel Sensing Intrinsic Amplitude: 12.5 mV
Lead Channel Sensing Intrinsic Amplitude: 12.5 mV
Lead Channel Setting Pacing Amplitude: 2 V
Lead Channel Setting Pacing Pulse Width: 0.4 ms
Lead Channel Setting Sensing Sensitivity: 0.3 mV
Zone Setting Status: 755011

## 2024-03-08 ENCOUNTER — Telehealth (HOSPITAL_COMMUNITY): Payer: Self-pay

## 2024-03-08 NOTE — Telephone Encounter (Signed)
 Called to confirm/remind patient of their appointment at the [facility] on 03/09/24.   Appointment:   [] Confirmed  [x] Left mess   [] No answer/No voice mail  [] VM Full/unable to leave message  [] Phone not in service  And to bring in all medications and/or complete list.  Confirmed patient has transportation. Gave directions, instructed to utilize valet parking.

## 2024-03-09 ENCOUNTER — Ambulatory Visit (HOSPITAL_COMMUNITY): Payer: Self-pay | Admitting: Cardiology

## 2024-03-09 ENCOUNTER — Ambulatory Visit (HOSPITAL_COMMUNITY)
Admission: RE | Admit: 2024-03-09 | Discharge: 2024-03-09 | Disposition: A | Discharge status: Home / Self Care | Source: Ambulatory Visit | Attending: Cardiology | Admitting: Cardiology

## 2024-03-09 VITALS — BP 90/64 | HR 78 | Wt 165.2 lb

## 2024-03-09 DIAGNOSIS — Z95810 Presence of automatic (implantable) cardiac defibrillator: Secondary | ICD-10-CM | POA: Diagnosis not present

## 2024-03-09 DIAGNOSIS — Z7901 Long term (current) use of anticoagulants: Secondary | ICD-10-CM | POA: Diagnosis not present

## 2024-03-09 DIAGNOSIS — I5022 Chronic systolic (congestive) heart failure: Secondary | ICD-10-CM | POA: Diagnosis present

## 2024-03-09 DIAGNOSIS — Z8572 Personal history of non-Hodgkin lymphomas: Secondary | ICD-10-CM | POA: Insufficient documentation

## 2024-03-09 DIAGNOSIS — Z7984 Long term (current) use of oral hypoglycemic drugs: Secondary | ICD-10-CM | POA: Diagnosis not present

## 2024-03-09 DIAGNOSIS — Z79899 Other long term (current) drug therapy: Secondary | ICD-10-CM | POA: Diagnosis not present

## 2024-03-09 DIAGNOSIS — I428 Other cardiomyopathies: Secondary | ICD-10-CM | POA: Diagnosis not present

## 2024-03-09 DIAGNOSIS — G4733 Obstructive sleep apnea (adult) (pediatric): Secondary | ICD-10-CM | POA: Diagnosis not present

## 2024-03-09 DIAGNOSIS — N183 Chronic kidney disease, stage 3 unspecified: Secondary | ICD-10-CM | POA: Insufficient documentation

## 2024-03-09 DIAGNOSIS — I4891 Unspecified atrial fibrillation: Secondary | ICD-10-CM | POA: Insufficient documentation

## 2024-03-09 LAB — CBC
HCT: 48.9 % (ref 39.0–52.0)
Hemoglobin: 15.4 g/dL (ref 13.0–17.0)
MCH: 27.9 pg (ref 26.0–34.0)
MCHC: 31.5 g/dL (ref 30.0–36.0)
MCV: 88.6 fL (ref 80.0–100.0)
Platelets: 149 10*3/uL — ABNORMAL LOW (ref 150–400)
RBC: 5.52 MIL/uL (ref 4.22–5.81)
RDW: 13.4 % (ref 11.5–15.5)
WBC: 7.9 10*3/uL (ref 4.0–10.5)
nRBC: 0 % (ref 0.0–0.2)

## 2024-03-09 LAB — BASIC METABOLIC PANEL WITH GFR
Anion gap: 12 (ref 5–15)
BUN: 15 mg/dL (ref 8–23)
CO2: 19 mmol/L — ABNORMAL LOW (ref 22–32)
Calcium: 9.1 mg/dL (ref 8.9–10.3)
Chloride: 105 mmol/L (ref 98–111)
Creatinine, Ser: 1.4 mg/dL — ABNORMAL HIGH (ref 0.61–1.24)
GFR, Estimated: 53 mL/min — ABNORMAL LOW (ref >60)
Glucose, Bld: 77 mg/dL (ref 70–99)
Potassium: 5.2 mmol/L — ABNORMAL HIGH (ref 3.5–5.1)
Sodium: 136 mmol/L (ref 135–145)
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
## 2024-03-09 NOTE — Patient Instructions (Addendum)
 Good to see you today!   Labs done today, your results will be available in MyChart, we will contact you for abnormal readings.  Your physician has requested that you have an echocardiogram. Echocardiography is a painless test that uses sound waves to create images of your heart. It provides your doctor with information about the size and shape of your heart and how well your heart's chambers and valves are working. This procedure takes approximately one hour. There are no restrictions for this procedure. Please do NOT wear cologne, perfume, aftershave, or lotions (deodorant is allowed). Please arrive 15 minutes prior to your appointment time.  Please note: We ask at that you not bring children with you during ultrasound (echo/ vascular) testing. Due to room size and safety concerns, children are not allowed in the ultrasound rooms during exams. Our front office staff cannot provide observation of children in our lobby area while testing is being conducted. An adult accompanying a patient to their appointment will only be allowed in the ultrasound room at the discretion of the ultrasound technician under special circumstances. We apologize for any inconvenience.  Your physician recommends that you schedule a follow-up appointment  6 months(December) Call office in October to schedule an appointment  If you have any questions or concerns before your next appointment please send us  a message through [place] or call our office at [phone].    TO LEAVE A MESSAGE FOR THE NURSE SELECT OPTION 2, PLEASE LEAVE A MESSAGE INCLUDING: YOUR NAME DATE OF BIRTH CALL BACK NUMBER REASON FOR CALL**this is important as we prioritize the call backs  YOU WILL RECEIVE A CALL BACK THE SAME DAY AS LONG AS YOU CALL BEFORE 4:00 PM At the [facility], you and your health needs are our priority. As part of our continuing mission to provide you with exceptional heart care, we have created designated  Provider Care Teams. These Care Teams include your primary Cardiologist (physician) and Advanced Practice Providers (APPs- Physician Assistants and Nurse Practitioners) who all work together to provide you with the care you need, when you need it.   You may see any of the following providers on your designated Care Team at your next follow up: Dr Jules Oar Dr Peder Bourdon Dr. Alwin Baars Dr. Arta Lark Amy Marijane Shoulders, NP Ruddy Corral, [place] [facility] [place], [place] Dennise Fitz, NP [place] Lee, NP Shawnee Dellen, NP Luster Salters, PharmD Bevely Brush, PharmD   Please be sure to bring in all your medications bottles to every appointment.    Thank you for choosing [facility]

## 2024-03-11 NOTE — Progress Notes (Signed)
 Remote ICD transmission.

## 2024-03-11 NOTE — Addendum Note (Signed)
 Addended by: Lott Rouleau A on: 03/11/2024 08:58 AM   Modules accepted: Orders

## 2024-03-18 ENCOUNTER — Ambulatory Visit (HOSPITAL_COMMUNITY): Payer: Self-pay | Admitting: Cardiology

## 2024-03-18 ENCOUNTER — Ambulatory Visit (HOSPITAL_COMMUNITY)
Admission: RE | Admit: 2024-03-18 | Discharge: 2024-03-18 | Disposition: A | Discharge status: Home / Self Care | Source: Ambulatory Visit | Attending: Internal Medicine | Admitting: Internal Medicine

## 2024-03-18 DIAGNOSIS — I5022 Chronic systolic (congestive) heart failure: Secondary | ICD-10-CM | POA: Insufficient documentation

## 2024-03-18 LAB — BASIC METABOLIC PANEL WITH GFR
Anion gap: 12 (ref 5–15)
BUN: 12 mg/dL (ref 8–23)
CO2: 25 mmol/L (ref 22–32)
Calcium: 9.2 mg/dL (ref 8.9–10.3)
Chloride: 99 mmol/L (ref 98–111)
Creatinine, Ser: 1.35 mg/dL — ABNORMAL HIGH (ref 0.61–1.24)
GFR, Estimated: 55 mL/min — ABNORMAL LOW (ref >60)
Glucose, Bld: 157 mg/dL — ABNORMAL HIGH (ref 70–99)
Potassium: 3.7 mmol/L (ref 3.5–5.1)
Sodium: 136 mmol/L (ref 135–145)

## 2024-04-30 ENCOUNTER — Ambulatory Visit: Payer: Self-pay

## 2024-05-06 LAB — CUP PACEART REMOTE DEVICE CHECK
Battery Remaining Longevity: 15 mo
Battery Voltage: 2.89 V
Brady Statistic RV Percent Paced: 0 %
Date Time Interrogation Session: 20250813205514
HighPow Impedance: 77 Ohm
Implantable Lead Connection Status: 753985
Implantable Lead Implant Date: 20140818
Implantable Lead Location: 753860
Implantable Pulse Generator Implant Date: 20140818
Lead Channel Impedance Value: 380 Ohm
Lead Channel Impedance Value: 456 Ohm
Lead Channel Pacing Threshold Amplitude: 0.75 V
Lead Channel Pacing Threshold Pulse Width: 0.4 ms
Lead Channel Sensing Intrinsic Amplitude: 18.375 mV
Lead Channel Sensing Intrinsic Amplitude: 18.375 mV
Lead Channel Setting Pacing Amplitude: 2 V
Lead Channel Setting Pacing Pulse Width: 0.4 ms
Lead Channel Setting Sensing Sensitivity: 0.3 mV
Zone Setting Status: 755011

## 2024-05-07 ENCOUNTER — Ambulatory Visit (INDEPENDENT_AMBULATORY_CARE_PROVIDER_SITE_OTHER): Payer: Self-pay

## 2024-05-07 DIAGNOSIS — I428 Other cardiomyopathies: Secondary | ICD-10-CM | POA: Diagnosis not present

## 2024-05-08 ENCOUNTER — Ambulatory Visit: Payer: Self-pay | Admitting: Cardiology

## 2024-06-09 ENCOUNTER — Telehealth: Payer: Self-pay | Admitting: Cardiology

## 2024-06-09 NOTE — Telephone Encounter (Signed)
 Pt calling back, he would like results from 07/31/22 sent over.

## 2024-06-09 NOTE — Telephone Encounter (Signed)
 Left message to return call.   [facility].

## 2024-06-09 NOTE — Telephone Encounter (Signed)
 Pt requesting his sleep study results be faxed over to the [place] [phone]

## 2024-06-16 NOTE — Progress Notes (Signed)
Remote ICD Transmission.

## 2024-07-30 ENCOUNTER — Ambulatory Visit: Payer: Self-pay

## 2024-08-06 ENCOUNTER — Ambulatory Visit: Payer: Self-pay

## 2024-08-08 LAB — CUP PACEART REMOTE DEVICE CHECK
Battery Remaining Longevity: 12 mo
Battery Voltage: 2.88 V
Brady Statistic RV Percent Paced: 0 %
Date Time Interrogation Session: 20251114043725
HighPow Impedance: 61 Ohm
Implantable Lead Connection Status: 753985
Implantable Lead Implant Date: 20140818
Implantable Lead Location: 753860
Implantable Pulse Generator Implant Date: 20140818
Lead Channel Impedance Value: 323 Ohm
Lead Channel Impedance Value: 399 Ohm
Lead Channel Pacing Threshold Amplitude: 0.875 V
Lead Channel Pacing Threshold Pulse Width: 0.4 ms
Lead Channel Sensing Intrinsic Amplitude: 11.25 mV
Lead Channel Sensing Intrinsic Amplitude: 11.25 mV
Lead Channel Setting Pacing Amplitude: 2 V
Lead Channel Setting Pacing Pulse Width: 0.4 ms
Lead Channel Setting Sensing Sensitivity: 0.3 mV
Zone Setting Status: 755011

## 2024-08-10 ENCOUNTER — Ambulatory Visit: Payer: Self-pay | Admitting: Cardiology

## 2024-08-23 ENCOUNTER — Telehealth (HOSPITAL_COMMUNITY): Payer: Self-pay | Admitting: Cardiology

## 2024-08-23 NOTE — Telephone Encounter (Signed)
 Called to confirm/remind patient of their appointment at the [facility] on 08/23/24.   Appointment:   [x] Confirmed  [] Left mess   [] No answer/No voice mail  [] VM Full/unable to leave message  [] Phone not in service  Patient reminded to bring all medications and/or complete list.  Confirmed patient has transportation. Gave directions, instructed to utilize valet parking.

## 2024-08-24 ENCOUNTER — Encounter (HOSPITAL_COMMUNITY): Payer: Self-pay | Admitting: Cardiology

## 2024-08-24 ENCOUNTER — Ambulatory Visit (HOSPITAL_COMMUNITY)
Admission: RE | Admit: 2024-08-24 | Discharge: 2024-08-24 | Disposition: A | Discharge status: Home / Self Care | Source: Ambulatory Visit | Attending: Cardiology | Admitting: Cardiology

## 2024-08-24 ENCOUNTER — Other Ambulatory Visit (HOSPITAL_COMMUNITY): Payer: Self-pay | Admitting: *Deleted

## 2024-08-24 ENCOUNTER — Ambulatory Visit (HOSPITAL_BASED_OUTPATIENT_CLINIC_OR_DEPARTMENT_OTHER)
Admission: RE | Admit: 2024-08-24 | Discharge: 2024-08-24 | Disposition: A | Discharge status: Home / Self Care | Source: Ambulatory Visit | Attending: Cardiology | Admitting: Cardiology

## 2024-08-24 VITALS — BP 104/62 | HR 76 | Ht 69.0 in | Wt 170.0 lb

## 2024-08-24 DIAGNOSIS — I440 Atrioventricular block, first degree: Secondary | ICD-10-CM | POA: Diagnosis not present

## 2024-08-24 DIAGNOSIS — G4733 Obstructive sleep apnea (adult) (pediatric): Secondary | ICD-10-CM | POA: Diagnosis not present

## 2024-08-24 DIAGNOSIS — I081 Rheumatic disorders of both mitral and tricuspid valves: Secondary | ICD-10-CM | POA: Diagnosis not present

## 2024-08-24 DIAGNOSIS — Z7984 Long term (current) use of oral hypoglycemic drugs: Secondary | ICD-10-CM | POA: Diagnosis not present

## 2024-08-24 DIAGNOSIS — Z950 Presence of cardiac pacemaker: Secondary | ICD-10-CM | POA: Insufficient documentation

## 2024-08-24 DIAGNOSIS — I444 Left anterior fascicular block: Secondary | ICD-10-CM | POA: Diagnosis not present

## 2024-08-24 DIAGNOSIS — I5022 Chronic systolic (congestive) heart failure: Secondary | ICD-10-CM | POA: Insufficient documentation

## 2024-08-24 DIAGNOSIS — Z95810 Presence of automatic (implantable) cardiac defibrillator: Secondary | ICD-10-CM | POA: Diagnosis not present

## 2024-08-24 DIAGNOSIS — I340 Nonrheumatic mitral (valve) insufficiency: Secondary | ICD-10-CM | POA: Diagnosis not present

## 2024-08-24 DIAGNOSIS — Z7901 Long term (current) use of anticoagulants: Secondary | ICD-10-CM | POA: Diagnosis not present

## 2024-08-24 DIAGNOSIS — Z79899 Other long term (current) drug therapy: Secondary | ICD-10-CM | POA: Insufficient documentation

## 2024-08-24 DIAGNOSIS — I4891 Unspecified atrial fibrillation: Secondary | ICD-10-CM | POA: Diagnosis not present

## 2024-08-24 DIAGNOSIS — N183 Chronic kidney disease, stage 3 unspecified: Secondary | ICD-10-CM | POA: Insufficient documentation

## 2024-08-24 DIAGNOSIS — I428 Other cardiomyopathies: Secondary | ICD-10-CM | POA: Diagnosis not present

## 2024-08-24 DIAGNOSIS — Z87891 Personal history of nicotine dependence: Secondary | ICD-10-CM | POA: Insufficient documentation

## 2024-08-24 DIAGNOSIS — Z8572 Personal history of non-Hodgkin lymphomas: Secondary | ICD-10-CM | POA: Insufficient documentation

## 2024-08-24 DIAGNOSIS — E782 Mixed hyperlipidemia: Secondary | ICD-10-CM | POA: Diagnosis not present

## 2024-08-24 LAB — ECHOCARDIOGRAM COMPLETE
AR max vel: 2.53 cm2
AV Area VTI: 2.66 cm2
AV Area mean vel: 2.2 cm2
AV Mean grad: 2 mmHg
AV Peak grad: 2.6 mmHg
Ao pk vel: 0.8 m/s
Area-P 1/2: 4.77 cm2
Calc EF: 38.2 %
MV VTI: 1.94 cm2
S' Lateral: 5 cm
Single Plane A2C EF: 39.8 %
Single Plane A4C EF: 38 %

## 2024-08-24 LAB — BASIC METABOLIC PANEL WITH GFR
Anion gap: 8 (ref 5–15)
BUN: 17 mg/dL (ref 8–23)
CO2: 31 mmol/L (ref 22–32)
Calcium: 9.5 mg/dL (ref 8.9–10.3)
Chloride: 99 mmol/L (ref 98–111)
Creatinine, Ser: 1.26 mg/dL — ABNORMAL HIGH (ref 0.61–1.24)
GFR, Estimated: 60 mL/min — ABNORMAL LOW (ref >60)
Glucose, Bld: 103 mg/dL — ABNORMAL HIGH (ref 70–99)
Potassium: 4 mmol/L (ref 3.5–5.1)
Sodium: 138 mmol/L (ref 135–145)

## 2024-08-24 LAB — LIPID PANEL
Cholesterol: 174 mg/dL (ref 0–200)
HDL: 71 mg/dL (ref >40)
LDL Cholesterol: 76 mg/dL (ref 0–99)
Total CHOL/HDL Ratio: 2.5 ratio
Triglycerides: 133 mg/dL (ref <150)
VLDL: 27 mg/dL (ref 0–40)

## 2024-08-24 LAB — BRAIN NATRIURETIC PEPTIDE: B Natriuretic Peptide: 391.2 pg/mL — ABNORMAL HIGH (ref 0.0–100.0)

## 2024-08-24 MED ORDER — EPLERENONE 25 MG PO TABS: 25.0000 mg | ORAL_TABLET | Freq: Every day | ORAL | 3 refills | Status: AC

## 2024-08-24 MED ORDER — METOPROLOL SUCCINATE ER 25 MG PO TB24: 25.0000 mg | ORAL_TABLET | Freq: Every day | ORAL | 3 refills | Status: AC

## 2024-08-24 MED ORDER — EMPAGLIFLOZIN 25 MG PO TABS: ORAL_TABLET | ORAL | 3 refills | Status: AC

## 2024-08-24 MED ORDER — SACUBITRIL-VALSARTAN 24-26 MG PO TABS: ORAL_TABLET | ORAL | 6 refills | Status: AC

## 2024-08-24 MED ORDER — FUROSEMIDE 20 MG PO TABS: 20.0000 mg | ORAL_TABLET | ORAL | 2 refills | Status: AC

## 2024-08-24 NOTE — Patient Instructions (Signed)
 Medication Changes:  START Entresto 1/2 tab Twice daily   DECREASE Furosemide  to 20 mg every other day   Lab Work:  Labs done today, your results will be available in MyChart, we will contact you for abnormal readings.  Labs needed again in 1-2 weeks, we have provided you a prescription to have this done locally   Special Instructions // Education:  Do the following things EVERYDAY: Weigh yourself in the morning before breakfast. Write it down and keep it in a log. Take your medicines as prescribed Eat low salt foods--Limit salt (sodium) to 2000 mg per day.  Stay as active as you can everyday Limit all fluids for the day to less than 2 liters   Your physician has requested that you regularly monitor and record your blood pressure readings at home. Please use the same machine at the same time of day to check your readings and record them to bring to your follow-up visit. PLEASE CALL US  IN 2 WEEKS WITH YOUR READINGS, OR SEND THEM THROUGH MYCHART   Follow-Up in: 3 months   At the [facility], you and your health needs are our priority. We have a designated team specialized in the treatment of Heart Failure. This Care Team includes your primary Heart Failure Specialized Cardiologist (physician), Advanced Practice Providers (APPs- Physician Assistants and Nurse Practitioners), and Pharmacist who all work together to provide you with the care you need, when you need it.   You may see any of the following providers on your designated Care Team at your next follow up:  Dr. Toribio Fuel Dr. Ezra Shuck Dr. Odis Brownie Greig Mosses, NP Caffie Shed, [place] [facility] [place], [place] Beckey Coe, NP Jordan Lee, NP Tinnie Redman, PharmD   Please be sure to bring in all your medications bottles to every appointment.   Need to Contact Us :  If you have any questions or concerns before your next appointment please send us  a message through [place] or call  our office at [phone].    TO LEAVE A MESSAGE FOR THE NURSE SELECT OPTION 2, PLEASE LEAVE A MESSAGE INCLUDING: YOUR NAME DATE OF BIRTH CALL BACK NUMBER REASON FOR CALL**this is important as we prioritize the call backs  YOU WILL RECEIVE A CALL BACK THE SAME DAY AS LONG AS YOU CALL BEFORE 4:00 PM

## 2024-08-24 NOTE — Progress Notes (Signed)
  Echocardiogram 2D Echocardiogram has been performed.  Jon Oconnell 08/24/2024, 2:35 PM

## 2024-08-25 ENCOUNTER — Ambulatory Visit (HOSPITAL_COMMUNITY): Payer: Self-pay | Admitting: Cardiology
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
## 2024-09-01 LAB — BASIC METABOLIC PANEL WITH GFR
BUN/Creatinine Ratio: 11 (ref 10–24)
BUN: 15 mg/dL (ref 8–27)
CO2: 24 mmol/L (ref 20–29)
Calcium: 9.6 mg/dL (ref 8.6–10.2)
Chloride: 99 mmol/L (ref 96–106)
Creatinine, Ser: 1.36 mg/dL — ABNORMAL HIGH (ref 0.76–1.27)
Glucose: 83 mg/dL (ref 70–99)
Potassium: 4.1 mmol/L (ref 3.5–5.2)
Sodium: 137 mmol/L (ref 134–144)
eGFR: 55 mL/min/1.73 — ABNORMAL LOW (ref >59)

## 2024-09-13 ENCOUNTER — Encounter: Payer: Self-pay | Admitting: Cardiology

## 2024-10-29 ENCOUNTER — Ambulatory Visit: Payer: Self-pay

## 2024-11-05 ENCOUNTER — Ambulatory Visit: Payer: Self-pay

## 2024-11-22 ENCOUNTER — Ambulatory Visit (HOSPITAL_COMMUNITY)

## 2025-01-28 ENCOUNTER — Ambulatory Visit: Payer: Self-pay

## 2025-02-04 ENCOUNTER — Ambulatory Visit: Payer: Self-pay

## 2025-04-29 ENCOUNTER — Ambulatory Visit: Payer: Self-pay
